# Patient Record
Sex: Female | Born: 1975 | Race: White | Hispanic: No | Marital: Married | Smoking: Never smoker
Health system: Southern US, Community
[De-identification: ages and names within clinical notes are randomized; demographics above are authoritative.]

## PROBLEM LIST (undated history)

## (undated) DIAGNOSIS — F419 Anxiety disorder, unspecified: Secondary | ICD-10-CM

## (undated) DIAGNOSIS — E559 Vitamin D deficiency, unspecified: Secondary | ICD-10-CM

## (undated) HISTORY — PX: KNEE SURGERY: SHX244

## (undated) HISTORY — DX: Vitamin D deficiency, unspecified: E55.9

## (undated) HISTORY — DX: Anxiety disorder, unspecified: F41.9

---

## 1994-09-13 HISTORY — PX: KIDNEY STONE SURGERY: SHX686

## 2004-12-15 ENCOUNTER — Ambulatory Visit: Payer: Self-pay | Admitting: Family Medicine

## 2005-03-11 ENCOUNTER — Ambulatory Visit: Payer: Self-pay | Admitting: Family Medicine

## 2005-12-09 ENCOUNTER — Ambulatory Visit: Payer: Self-pay | Admitting: Family Medicine

## 2005-12-15 ENCOUNTER — Ambulatory Visit: Payer: Self-pay | Admitting: Family Medicine

## 2006-10-28 ENCOUNTER — Ambulatory Visit: Payer: Self-pay | Admitting: Family Medicine

## 2008-10-22 ENCOUNTER — Ambulatory Visit: Payer: Self-pay | Admitting: Family Medicine

## 2008-10-23 ENCOUNTER — Encounter: Payer: Self-pay | Admitting: Family Medicine

## 2008-10-23 DIAGNOSIS — Z87898 Personal history of other specified conditions: Secondary | ICD-10-CM | POA: Insufficient documentation

## 2008-12-03 ENCOUNTER — Encounter: Payer: Self-pay | Admitting: Obstetrics & Gynecology

## 2008-12-03 ENCOUNTER — Ambulatory Visit: Payer: Self-pay | Admitting: Obstetrics & Gynecology

## 2008-12-03 LAB — CONVERTED CEMR LAB
HCV Ab: NEGATIVE
Hepatitis B Surface Ag: NEGATIVE

## 2008-12-18 ENCOUNTER — Ambulatory Visit (HOSPITAL_COMMUNITY): Admission: RE | Admit: 2008-12-18 | Discharge: 2008-12-18 | Payer: Self-pay | Admitting: Family Medicine

## 2009-09-13 HISTORY — PX: CHOLECYSTECTOMY: SHX55

## 2010-04-21 ENCOUNTER — Encounter (INDEPENDENT_AMBULATORY_CARE_PROVIDER_SITE_OTHER): Payer: Self-pay | Admitting: *Deleted

## 2010-05-02 ENCOUNTER — Inpatient Hospital Stay: Payer: Self-pay | Admitting: Surgery

## 2010-05-05 LAB — PATHOLOGY REPORT

## 2010-05-14 ENCOUNTER — Ambulatory Visit: Payer: Self-pay | Admitting: Surgery

## 2010-05-15 ENCOUNTER — Ambulatory Visit: Payer: Self-pay | Admitting: Surgery

## 2010-05-27 ENCOUNTER — Inpatient Hospital Stay: Payer: Self-pay | Admitting: Surgery

## 2010-06-02 LAB — PATHOLOGY REPORT

## 2010-06-18 ENCOUNTER — Ambulatory Visit: Payer: Self-pay | Admitting: Gastroenterology

## 2010-08-20 ENCOUNTER — Ambulatory Visit: Payer: Self-pay | Admitting: Anesthesiology

## 2010-08-20 ENCOUNTER — Other Ambulatory Visit: Payer: Self-pay | Admitting: Anesthesiology

## 2010-09-02 ENCOUNTER — Ambulatory Visit: Payer: Self-pay | Admitting: Anesthesiology

## 2010-09-13 LAB — HM PAP SMEAR: HM PAP: NORMAL

## 2010-09-22 ENCOUNTER — Ambulatory Visit
Admission: RE | Admit: 2010-09-22 | Discharge: 2010-09-22 | Payer: Self-pay | Source: Home / Self Care | Attending: Family Medicine | Admitting: Family Medicine

## 2010-09-30 ENCOUNTER — Ambulatory Visit: Payer: Self-pay | Admitting: Anesthesiology

## 2010-10-13 NOTE — Letter (Signed)
Summary: Nadara Eaton letter  [place] [facility]  [address] [place], [place] [address]   Phone: [phone]  Fax: [phone]       04/21/2010 MRN: [id number]  [name] [address] [place], [place]  [address]  Dear Ms. [name] [place] and [place] announce the retirement of Arta Silence, M.D., from full-time practice at the [facility] office effective March 12, 2010 and his plans of returning part-time.  It is important to Dr. Hetty Ely and to our practice that you understand that [facility] has seven physicians in our office for your health care needs.  We will continue to offer the same exceptional care that you have today.    Dr. Hetty Ely has spoken to many of you about his plans for retirement and returning part-time in the fall.   We will continue to work with you through the transition to schedule appointments for you in the office and meet the high standards that [facility] is committed to.   Again, it is with great pleasure that we share the news that Dr. Hetty Ely will return to [facility] at [facility] in October of 2011 with a reduced schedule.    If you have any questions, or would like to request an appointment with one of our physicians, please call us at [phone] and press the option for Scheduling an appointment.  We take pleasure in providing you with excellent patient care and look forward to seeing you at your next office visit.  Our [facility] Physicians are:  Tillman Abide, M.D. Laurita Quint, M.D. Roxy Manns, M.D. Kerby Nora, M.D. Hannah Beat, M.D. Ruthe Mannan, M.D. We proudly welcomed Raechel Ache, M.D. and Eustaquio Boyden, M.D. to the practice in July/August 2011.  Sincerely,  [facility]

## 2010-12-01 ENCOUNTER — Ambulatory Visit: Payer: Self-pay | Admitting: Obstetrics and Gynecology

## 2010-12-03 ENCOUNTER — Ambulatory Visit: Payer: Self-pay | Admitting: Gastroenterology

## 2010-12-10 LAB — PATHOLOGY REPORT

## 2011-01-26 NOTE — Assessment & Plan Note (Signed)
NAME:  Lori Huber, SHIMA NO.:  [id number]   MEDICAL RECORD NO.:  [id number]          PATIENT TYPE:  POB   LOCATION:  [facility] at [facility]         FACILITY:  [facility]   PHYSICIAN:  Tinnie Gens, MD        DATE OF BIRTH:  07-01-1976   DATE OF SERVICE:  09/22/2010                                  CLINIC NOTE   CHIEF COMPLAINT:  IUD removal and reinsertion   HISTORY OF PRESENT ILLNESS:  The patient is a 35 year old gravida 1,  para 0-0-1-0 who has three stepchildren and does not desire fertility.  She has had an IUD in since 2007.  She desires removal of this one  followed by replacement.  She has regular cycles on her IUD and no heavy  vaginal bleeding.  She really has no problem related to her IUD and she  does desire no children at present.   PHYSICAL EXAMINATION:  VITAL SIGNS:  As in the chart.  GENERAL:  She is a well-developed, well-nourished female in no acute  distress.  ABDOMEN:  Soft, nontender, nondistended.  GU:  Normal external female genitalia.  BUS is normal.  Vagina is pink  and rugated.  Cervix is and nulliparous without lesions.   PROCEDURE:  A straight Tresa Endo was placed through the cervix.  On multiple  attempts to remove the IUD, finally strings were grasped and IUD was  removed.  The uterus that was cleaned with Betadine x2, grasped  anteriorly with single-tooth tenaculum, sounded to 7 cm, and an IUD  placed without difficulty.  The patient tolerated the procedure well.   IMPRESSION:  Undesired fertility.   PLAN:  Status post IUD removal and reinsertion.  The patient will return  in March of this year for repeat Pap smear and IUD string check.           ______________________________  Tinnie Gens, MD     TP/MEDQ  D:  09/22/2010  T:  09/23/2010  Job:  [id number]

## 2011-01-26 NOTE — Assessment & Plan Note (Signed)
NAME:  Lori Huber, Lori Huber                 ACCOUNT NO.:  [id number]   MEDICAL RECORD NO.:  [id number]          PATIENT TYPE:  POB   LOCATION:  [facility] at [facility]         FACILITY:  [facility]   PHYSICIAN:  Johnella Moloney, MD        DATE OF BIRTH:  15-Oct-1975   DATE OF SERVICE:  12/03/2008                                  CLINIC NOTE   CHIEF COMPLAINT:  The patient is here for her yearly examination and  also reports breast pain.   HISTORY OF PRESENT ILLNESS:  The patient is a 35 year old gravida 1,  para 0-0-1-0, last menstrual period of November 18, 2008 who is here for her  annual examination and to initiate gynecologic care.  The patient also  reports that she has had breast pain since December of 2009.  This pain  is concentrated on the outer area lateral border of both breasts but  worse on her left side.  She initially felt that the pain was cyclical  but in the last few months it has been present without any association  to her menstrual periods.  She also feels that her breasts are very  lumpy and wants to make sure that this is normal.  The patient does  not have a family history of breast cancer or any other cancers.  She is  concerned about this breast pain.  Otherwise she has no other  gynecologic concerns.  She reports no intermenstrual bleeding, abnormal  vaginal discharge, problems with intercourse or any other concerns.   PAST OB/GYN HISTORY:  The patient had menarche at age 45.  She has  regular menstrual cycles with 28 days in between cycles.  Her periods  last with 3-5 days and are accompanied by medium flow and mild pain.  No  intermenstrual bleeding.  The patient had a Mirena IUD placed for  contraception in December of 2006.  She has had no problems with this.  She denies checking her IUD or feeling the strings periodically.   OBSTETRICAL HISTORY:  The patient has had 1 termination.  Her last Pap  smear was in December of 2006, which was normal.  She denies any history  of  abnormal Pap smears.  She has never had a mammogram.   PAST MEDICAL HISTORY:  Kidney stones, chicken pox as a child.   PAST SURGICAL HISTORY:  Lithotripsy in 1992 at [facility].   MEDICATIONS:  None.   ALLERGIES:  NO KNOWN DRUG ALLERGIES.  THE PATIENT IS NOT ALLERGIC TO  LATEX.   FAMILY HISTORY:  Remarkable for diabetes, heart disease, and high blood  pressure.  The patient denies a family history of any gynecologic,  breast or colon cancer or any cancer history.   SOCIAL HISTORY:  The patient lives with her husband.  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  The patient does not  smoke.  She drinks 1-2 alcoholic drinks per week and denies any use of  IV or addicting drugs.  She also denies any history of sexual or  physical abuse.  The patient exercises 3-4 times a week.  She usually  walks  about for about 20-40 minutes and in the last few months has been  playing [employer] game.   REVIEW OF SYSTEMS:  Remarkable for frequent headaches.  She denies any  other symptoms.  Her headaches are alleviated by ibuprofen.   On physical examination her blood pressure is 113/76, pulse 82, weight  is 138 pounds, height 5 feet 6-1/2 inches.  In general, no apparent distress.  HEENT:  Normocephalic, atraumatic.  Neck is supple.  Normal thyroid.  No masses.  LUNGS:  Clear to auscultation bilaterally.  HEART:  Regular rate and rhythm.  BREASTS:  Symmetric in size.  No abnormal masses, skin changes, nipple  drainage or lymphadenopathy noted.  The patient just has glandular  tissue that is palpated bilaterally and on the lateral borders of both  breasts the patient was noted to be tender on palpation, especially on  her left side, but no masses were felt in these areas.  ABDOMEN:  Soft, nontender, nondistended.  No abnormal masses.  EXTREMITIES:  No cyanosis, clubbing, or edema.  Nontender.  PELVIC EXAMINATION:  Normal external female  genitalia.  Pink well-  rugated vagina.  Normal discharge.  Normal cervical contour.  Pap smear  was obtained.  On bimanual exam, normal size uterus, mobile and  anteverted.  Normal adnexa bilaterally and no tenderness on adnexa  palpation.   ASSESSMENT AND PLAN:  The patient is a 35 year old gravida 1, para 0-0-1-  0 who is here to initiate gynecologic care and also for a complaint of  breast pain.  As for her preventative health maintenance, she underwent  a Pap smear today and the patient is also interested in sexually-  transmitted disease screening.  A reflex gonorrhea and chlamydia will be  checked from her Pap smear sample and she will have serum tested for  human immunodeficiency virus, hepatitis B, hepatitis C and syphilis.  The patient will be called with the results of this evaluation.  As for  her breast pain, the patient might have a fibrocystic breast, but given  the degree of her symptoms, she was told that she will be booked for a  diagnostic mammogram to evaluate for any other etiologies of her pain.  Of note, during the pelvic examination, the patient's intrauterine  device strings were not visualized.  The patient does say that she does  not remember the last time she felt her intrauterine device strings.  Because of this, a pelvic ultrasound will be ordered to evaluate the  intrauterine device position and will follow up on these results.  The  patient was told to follow up in the clinic for any further gynecologic  concerns.           ______________________________  Johnella Moloney, MD     UD/MEDQ  D:  12/03/2008  T:  12/03/2008  Job:  [id number]

## 2011-01-29 NOTE — Letter (Signed)
October 28, 2006    Rene Kocher, M.D.  [address]  [place], [place] [address]   RE:  Lori, Huber  MRN:  [id number]  /  DOB:  [birth year]   Dear Woody Seller:   This is to introduce Lori Huber, a 35 year old white female who comes  to me today for headaches.  She has had them off and on for some time in  her life but in the last year has developed them almost daily, gets at  least four per week which are painful and excruciating, all throughout  the month, different times of the day, different times of the week,  different stress levels and at different times with respect to her  periods.  She even now is waking up with headaches at times.  She has  gone without caffeine for as long as a year and it has not seemed to  have made any difference.  She is on no medications, has no allergies,  and is basically healthy.  She has had kidney stones with lithotripsy in  1996.   I appreciate your seeing her and look forward to your evaluation.    Sincerely,      Arta Silence, MD  Electronically Signed    RNS/MedQ  DD: 10/28/2006  DT: 10/28/2006  Job #: [id number]

## 2011-10-27 ENCOUNTER — Ambulatory Visit: Payer: Self-pay | Admitting: Physician Assistant

## 2011-11-25 ENCOUNTER — Ambulatory Visit: Payer: Self-pay | Admitting: Orthopedic Surgery

## 2013-11-14 ENCOUNTER — Ambulatory Visit (INDEPENDENT_AMBULATORY_CARE_PROVIDER_SITE_OTHER): Payer: BC Managed Care – PPO | Admitting: Internal Medicine

## 2013-11-14 ENCOUNTER — Telehealth: Payer: Self-pay

## 2013-11-14 ENCOUNTER — Encounter: Payer: Self-pay | Admitting: Internal Medicine

## 2013-11-14 VITALS — BP 102/64 | HR 72 | Temp 97.9°F | Ht 65.0 in | Wt 232.0 lb

## 2013-11-14 DIAGNOSIS — R0602 Shortness of breath: Secondary | ICD-10-CM

## 2013-11-14 DIAGNOSIS — R110 Nausea: Secondary | ICD-10-CM

## 2013-11-14 DIAGNOSIS — R42 Dizziness and giddiness: Secondary | ICD-10-CM

## 2013-11-14 MED ORDER — MECLIZINE HCL 50 MG PO TABS: 25.0000 mg | ORAL_TABLET | Freq: Three times a day (TID) | ORAL | Status: DC | PRN

## 2013-11-14 NOTE — Progress Notes (Signed)
Pre visit review using our clinic review tool, if applicable. No additional management support is needed unless otherwise documented below in the visit note. 

## 2013-11-14 NOTE — Progress Notes (Signed)
Subjective:    Patient ID: Lori Huber, female    DOB: 1976-04-14, 38 y.o.   MRN: [id number]  HPI  Pt presents to the clinic today with c/o dizziness and nausea. She reorts this started 1 days ago. The dizziness last about 2-3 hours and then resolved. She reports the room was spinning. She denies diarrhea and vomiting but does reports that she has had loose stool today x 2. She reports the stool is normal in color and denies blood in her stool. Additionally, she c/o shortness of breath. This started about 2 weeks ago. She reports it seems worse when she is laying down with her arms crossed. She does feel some heaviness in her chest. She denies chest pain. She has no history of allergies, asthma or breathing problems.  Review of Systems      No past medical history on file.  No current outpatient prescriptions on file.   No current facility-administered medications for this visit.    No Known Allergies  No family history on file.  History   Social History  . Marital Status: Married    Spouse Name: N/A    Number of Children: N/A  . Years of Education: N/A   Occupational History  . Not on file.   Social History Main Topics  . Smoking status: Not on file  . Smokeless tobacco: Not on file  . Alcohol Use: Not on file  . Drug Use: Not on file  . Sexual Activity: Not on file   Other Topics Concern  . Not on file   Social History Narrative  . No narrative on file     Constitutional: Pt reports headache. Denies fever, malaise, fatigue, or abrupt weight changes.  HEENT: Pt reports runny nose. Denies eye pain, eye redness, ear pain, ringing in the ears, wax buildup, nasal congestion, bloody nose, or sore throat. Respiratory: Pt reports shortness of breath. Denies difficulty breathing, cough or sputum production.   Cardiovascular: Denies chest pain, chest tightness, palpitations or swelling in the hands or feet.  Gastrointestinal: Pt reports loose stools. Denies abdominal  pain, bloating, constipation, diarrhea or blood in the stool.  Neurological: Pt reports dizziness. Denies  difficulty with memory, difficulty with speech or problems with balance and coordination.   No other specific complaints in a complete review of systems (except as listed in HPI above).  Objective:   Physical Exam  BP 102/64  Pulse 72  Temp(Src) 97.9 F (36.6 C) (Oral)  Ht 5\' 5"  (1.651 m)  Wt 232 lb (105.235 kg)  BMI 38.61 kg/m2  SpO2 98%  LMP 11/06/2013 Wt Readings from Last 3 Encounters:  11/14/13 232 lb (105.235 kg)  10/07/03 199 lb (90.266 kg)  10/22/08 240 lb (108.863 kg)    General: Appears her stated age, obese but well developed, well nourished in NAD. HEENT: Head: normal shape and size; Eyes: sclera white, no icterus, conjunctiva pink, PERRLA and EOMs intact; Ears: bilateral cerumen impaction; Nose: mucosa pink and moist, septum midline; Throat/Mouth: Teeth present, mucosa pink and moist, no exudate, lesions or ulcerations noted.  Cardiovascular: Normal rate and rhythm. S1,S2 noted.  No murmur, rubs or gallops noted. No JVD or BLE edema. No carotid bruits noted. Pulmonary/Chest: Normal effort and positive vesicular breath sounds. No respiratory distress. No wheezes, rales or ronchi noted.  Abdomen: Soft and nontender. Normal bowel sounds, no bruits noted. No distention or masses noted. Liver, spleen and kidneys non palpable. Neurological: Alert and oriented. Cranial nerves II-XII intact.  Coordination normal. +DTRs bilaterally.        Assessment & Plan:   Dizziness and nausea secondary to vertigo:  Drink plenty of fluids to avoid dehydration eRx for Meclizine 25 mg PO tid prn Make poistion changes slowly She declines cerumen removal as she has this done every 6 months by ENT  Shortness of breath:  ? Anxiety related No acute findings Will perform spirometry at new pt visit in 2 weeks  RTC as needed or if symptoms persist or worsen

## 2013-11-14 NOTE — Telephone Encounter (Signed)
Pt called stating that when she went to pharmacy to pick up Rx for Meclizine 50 mg tab--they did not have that dose available only the 25 mg--I called Rene KocherRegina to confirm that it was okay to call pharmacy to change to 25 mg and she stated that was okay--I called pt and she is aware

## 2013-11-14 NOTE — Patient Instructions (Addendum)
Vertigo Vertigo means you feel like you or your surroundings are moving when they are not. Vertigo can be dangerous if it occurs when you are at work, driving, or performing difficult activities.  CAUSES  Vertigo occurs when there is a conflict of signals sent to your brain from the visual and sensory systems in your body. There are many different causes of vertigo, including:  Infections, especially in the inner ear.  A bad reaction to a drug or misuse of alcohol and medicines.  Withdrawal from drugs or alcohol.  Rapidly changing positions, such as lying down or rolling over in bed.  A migraine headache.  Decreased blood flow to the brain.  Increased pressure in the brain from a head injury, infection, tumor, or bleeding. SYMPTOMS  You may feel as though the world is spinning around or you are falling to the ground. Because your balance is upset, vertigo can cause nausea and vomiting. You may have involuntary eye movements (nystagmus). DIAGNOSIS  Vertigo is usually diagnosed by physical exam. If the cause of your vertigo is unknown, your caregiver may perform imaging tests, such as an MRI scan (magnetic resonance imaging). TREATMENT  Most cases of vertigo resolve on their own, without treatment. Depending on the cause, your caregiver may prescribe certain medicines. If your vertigo is related to body position issues, your caregiver may recommend movements or procedures to correct the problem. In rare cases, if your vertigo is caused by certain inner ear problems, you may need surgery. HOME CARE INSTRUCTIONS   Follow your caregiver's instructions.  Avoid driving.  Avoid operating heavy machinery.  Avoid performing any tasks that would be dangerous to you or others during a vertigo episode.  Tell your caregiver if you notice that certain medicines seem to be causing your vertigo. Some of the medicines used to treat vertigo episodes can actually make them worse in some people. SEEK  IMMEDIATE MEDICAL CARE IF:   Your medicines do not relieve your vertigo or are making it worse.  You develop problems with talking, walking, weakness, or using your arms, hands, or legs.  You develop severe headaches.  Your nausea or vomiting continues or gets worse.  You develop visual changes.  A family member notices behavioral changes.  Your condition gets worse. MAKE SURE YOU:  Understand these instructions.  Will watch your condition.  Will get help right away if you are not doing well or get worse. Document Released: 06/09/2005 Document Revised: 11/22/2011 Document Reviewed: 03/18/2011 ExitCare Patient Information 2014 [employer].  

## 2013-11-26 ENCOUNTER — Ambulatory Visit (INDEPENDENT_AMBULATORY_CARE_PROVIDER_SITE_OTHER): Payer: BC Managed Care – PPO | Admitting: Internal Medicine

## 2013-11-26 ENCOUNTER — Encounter: Payer: Self-pay | Admitting: Internal Medicine

## 2013-11-26 VITALS — BP 112/78 | HR 77 | Temp 97.9°F | Ht 65.25 in | Wt 234.0 lb

## 2013-11-26 DIAGNOSIS — E669 Obesity, unspecified: Secondary | ICD-10-CM

## 2013-11-26 DIAGNOSIS — R5381 Other malaise: Secondary | ICD-10-CM

## 2013-11-26 DIAGNOSIS — R0602 Shortness of breath: Secondary | ICD-10-CM

## 2013-11-26 DIAGNOSIS — R5383 Other fatigue: Secondary | ICD-10-CM

## 2013-11-26 LAB — CBC
HCT: 40.6 % (ref 36.0–46.0)
Hemoglobin: 13.4 g/dL (ref 12.0–15.0)
MCHC: 33.1 g/dL (ref 30.0–36.0)
MCV: 95 fl (ref 78.0–100.0)
PLATELETS: 282 10*3/uL (ref 150.0–400.0)
RBC: 4.27 Mil/uL (ref 3.87–5.11)
RDW: 13.1 % (ref 11.5–14.6)
WBC: 9.7 10*3/uL (ref 4.5–10.5)

## 2013-11-26 LAB — COMPREHENSIVE METABOLIC PANEL
ALBUMIN: 4 g/dL (ref 3.5–5.2)
ALK PHOS: 66 U/L (ref 39–117)
ALT: 10 U/L (ref 0–35)
AST: 12 U/L (ref 0–37)
BUN: 14 mg/dL (ref 6–23)
CALCIUM: 9.5 mg/dL (ref 8.4–10.5)
CHLORIDE: 103 meq/L (ref 96–112)
CO2: 30 mEq/L (ref 19–32)
Creatinine, Ser: 0.9 mg/dL (ref 0.4–1.2)
GFR: 74.78 mL/min (ref >60.00)
GLUCOSE: 95 mg/dL (ref 70–99)
POTASSIUM: 4.2 meq/L (ref 3.5–5.1)
SODIUM: 139 meq/L (ref 135–145)
TOTAL PROTEIN: 7.5 g/dL (ref 6.0–8.3)
Total Bilirubin: 0.9 mg/dL (ref 0.3–1.2)

## 2013-11-26 LAB — LIPID PANEL
CHOLESTEROL: 172 mg/dL (ref 0–200)
HDL: 39.8 mg/dL (ref >39.00)
LDL CALC: 109 mg/dL — AB (ref 0–99)
Total CHOL/HDL Ratio: 4
Triglycerides: 118 mg/dL (ref 0.0–149.0)
VLDL: 23.6 mg/dL (ref 0.0–40.0)

## 2013-11-26 LAB — TSH: TSH: 2.66 u[IU]/mL (ref 0.35–5.50)

## 2013-11-26 LAB — HEMOGLOBIN A1C: HEMOGLOBIN A1C: 5.5 % (ref 4.6–6.5)

## 2013-11-26 LAB — VITAMIN B12: VITAMIN B 12: 276 pg/mL (ref 211–911)

## 2013-11-26 NOTE — Progress Notes (Signed)
HPI  Pt presents to the clinic today to establish care. She has not had a PCP in many years. She does have multiple concerns today.  Fatigue: She reports this started in the last year. She relates it to her recent 90 lb weight gain. She has been dieting and exercising  Shortness of breath: Has not gotten any worse.  Headaches: She c/o frequent headaches. They start in her temples and across her forehead. They do occur every day. This started about 1 month ago. She does c/o occasional blurred vision and sensitivity to light and sound. She also c/o tension in the shoulder and upper tech. She does take Ibuprofen or Tylenol with good relief.  Flu: never Tetanus: 1996 LMP: 11/07/13 Pap Smear: 2012 Dentist: as needed   History reviewed. No pertinent past medical history.  No current outpatient prescriptions on file.   No current facility-administered medications for this visit.    No Known Allergies  Family History  Problem Relation Age of Onset  . Hypertension Father   . Hypertension Brother   . Hypertension Paternal Grandmother   . Stroke Paternal Grandmother   . Heart disease Paternal Grandmother   . Diabetes Paternal Grandmother     History   Social History  . Marital Status: Married    Spouse Name: N/A    Number of Children: N/A  . Years of Education: N/A   Occupational History  . Not on file.   Social History Main Topics  . Smoking status: Never Smoker   . Smokeless tobacco: Never Used  . Alcohol Use: Yes     Comment: occasional  . Drug Use: No  . Sexual Activity: Not on file   Other Topics Concern  . Not on file   Social History Narrative  . No narrative on file    ROS:  Constitutional: Denies fever, malaise, fatigue, headache or abrupt weight changes.  HEENT: Denies eye pain, eye redness, ear pain, ringing in the ears, wax buildup, runny nose, nasal congestion, bloody nose, or sore throat. Respiratory: Pt reports shortness of breath. Denies  difficulty breathing, cough or sputum production.   Cardiovascular: Denies chest pain, chest tightness, palpitations or swelling in the hands or feet.  Gastrointestinal: Denies abdominal pain, bloating, constipation, diarrhea or blood in the stool.  GU: Denies frequency, urgency, pain with urination, blood in urine, odor or discharge. Musculoskeletal: Denies decrease in range of motion, difficulty with gait, muscle pain or joint pain and swelling.  Skin: Denies redness, rashes, lesions or ulcercations.  Neurological: Denies dizziness, difficulty with memory, difficulty with speech or problems with balance and coordination.   No other specific complaints in a complete review of systems (except as listed in HPI above).  PE:  BP 112/78  Pulse 77  Temp(Src) 97.9 F (36.6 C) (Oral)  Ht 5' 5.25" (1.657 m)  Wt 234 lb (106.142 kg)  BMI 38.66 kg/m2  SpO2 99%  LMP 11/06/2013 Wt Readings from Last 3 Encounters:  11/26/13 234 lb (106.142 kg)  11/14/13 232 lb (105.235 kg)  10/07/03 199 lb (90.266 kg)    General: Appears her stated age, obese but well developed, well nourished in NAD. HEENT: Head: normal shape and size; Eyes: sclera white, no icterus, conjunctiva pink, PERRLA and EOMs intact; Ears: Tm's gray and intact, normal light reflex; Nose: mucosa pink and moist, septum midline; Throat/Mouth: Teeth present, mucosa pink and moist, no lesions or ulcerations noted.  Neck: Normal range of motion. Neck supple, trachea midline. No massses, lumps or  thyromegaly present.  Cardiovascular: Normal rate and rhythm. S1,S2 noted.  No murmur, rubs or gallops noted. No JVD or BLE edema. No carotid bruits noted. Pulmonary/Chest: Normal effort and positive vesicular breath sounds. No respiratory distress. No wheezes, rales or ronchi noted.  Abdomen: Soft and nontender. Normal bowel sounds, no bruits noted. No distention or masses noted. Liver, spleen and kidneys non palpable. Musculoskeletal: Normal range of  motion. No signs of joint swelling. No difficulty with gait.  Neurological: Alert and oriented. Cranial nerves II-XII intact. Coordination normal. +DTRs bilaterally. Psychiatric: Mood and affect normal. Behavior is normal. Judgment and thought content normal.     Assessment and Plan:  Shortness of breath:  ? Weight related Spirometry normal  Prevent Health Maintenance:  Encouraged her to work on diet and exercise Encouraged her to visit a dentist at least yearly Will check basic screening labs today  Fatigue:  Will check TSH and B12 today  RTC in 1 year or sooner if needed

## 2013-11-26 NOTE — Patient Instructions (Addendum)

## 2013-11-26 NOTE — Progress Notes (Signed)
Pre visit review using our clinic review tool, if applicable. No additional management support is needed unless otherwise documented below in the visit note. 

## 2013-11-27 ENCOUNTER — Encounter: Payer: Self-pay | Admitting: Family Medicine

## 2014-01-07 ENCOUNTER — Encounter: Payer: Self-pay | Admitting: Internal Medicine

## 2014-01-07 ENCOUNTER — Ambulatory Visit (INDEPENDENT_AMBULATORY_CARE_PROVIDER_SITE_OTHER): Payer: BC Managed Care – PPO | Admitting: Internal Medicine

## 2014-01-07 VITALS — BP 118/82 | HR 77 | Temp 98.6°F | Wt 230.3 lb

## 2014-01-07 DIAGNOSIS — J029 Acute pharyngitis, unspecified: Secondary | ICD-10-CM

## 2014-01-07 LAB — POCT RAPID STREP A (OFFICE): RAPID STREP A SCREEN: NEGATIVE

## 2014-01-07 NOTE — Progress Notes (Signed)
HPI  Pt presents to the clinic today with c/o cough ,sore throat and ear fullness. She reports this started yesterday. She is having pain with swallowing. Her cough is productive of mucous. She denies fever, chills or body aches. She has tried Mudloggeralka celtzer with minimal relief. She has no history of allergies or breathing problems. She has not had sick contacts that she is aware of.  Review of Systems     History reviewed. No pertinent past medical history.  Family History  Problem Relation Age of Onset  . Hypertension Father   . Hypertension Brother   . Hypertension Paternal Grandmother   . Stroke Paternal Grandmother   . Heart disease Paternal Grandmother   . Diabetes Paternal Grandmother   . Cancer Neg Hx     History   Social History  . Marital Status: Married    Spouse Name: N/A    Number of Children: N/A  . Years of Education: N/A   Occupational History  . Not on file.   Social History Main Topics  . Smoking status: Never Smoker   . Smokeless tobacco: Never Used  . Alcohol Use: 4.2 oz/week    7 Glasses of wine per week     Comment: occasional  . Drug Use: No  . Sexual Activity: Not on file   Other Topics Concern  . Not on file   Social History Narrative  . No narrative on file    No Known Allergies   Constitutional: Positive headache, fatigue. Denies fever or abrupt weight changes.  HEENT:  Positive sore throat. Denies eye redness, eye pain, pressure behind the eyes, facial pain, nasal congestion, ear pain, ringing in the ears, wax buildup, runny nose or bloody nose. Respiratory: Positive cough. Denies difficulty breathing or shortness of breath.  Cardiovascular: Denies chest pain, chest tightness, palpitations or swelling in the hands or feet.   No other specific complaints in a complete review of systems (except as listed in HPI above).  Objective:   BP 118/82  Pulse 77  Temp(Src) 98.6 F (37 C) (Oral)  Wt 230 lb 5.6 oz (104.486 kg)  SpO2 98% Wt  Readings from Last 3 Encounters:  01/07/14 230 lb 5.6 oz (104.486 kg)  11/26/13 234 lb (106.142 kg)  11/14/13 232 lb (105.235 kg)     General: Appears her stated age, well developed, well nourished in NAD. HEENT: Head: normal shape and size; Eyes: sclera white, no icterus, conjunctiva pink, PERRLA and EOMs intact; Ears: Tm's gray and intact, normal light reflex; Nose: mucosa pink and moist, septum midline; Throat/Mouth: + PND. Teeth present, mucosa erythematous and moist, exudate noted on left tonsillar pillar, no lesions or ulcerations noted.  Neck:  Neck supple, trachea midline. No massses, lumps or thyromegaly present.  Cardiovascular: Normal rate and rhythm. S1,S2 noted.  No murmur, rubs or gallops noted. No JVD or BLE edema. No carotid bruits noted. Pulmonary/Chest: Normal effort and positive vesicular breath sounds. No respiratory distress. No wheezes, rales or ronchi noted.      Assessment & Plan:   Viral pharyngitis:  RST: negative Get some rest and drink plenty of water Do salt water gargles for the sore throat Try ibuprofen for pain and inflammation  RTC as needed or if symptoms persist.

## 2014-01-07 NOTE — Addendum Note (Signed)
Addended by: Littie DeedsEVONTENNO, MELANIE Y on: 01/07/2014 05:00 PM   Modules accepted: Orders

## 2014-01-07 NOTE — Patient Instructions (Addendum)
Viral Pharyngitis Viral pharyngitis is a viral infection that produces redness, pain, and swelling (inflammation) of the throat. It can spread from person to person (contagious). CAUSES Viral pharyngitis is caused by inhaling a large amount of certain germs called viruses. Many different viruses cause viral pharyngitis. SYMPTOMS Symptoms of viral pharyngitis include:  Sore throat.  Tiredness.  Stuffy nose.  Low-grade fever.  Congestion.  Cough. TREATMENT Treatment includes rest, drinking plenty of fluids, and the use of over-the-counter medication (approved by your caregiver). HOME CARE INSTRUCTIONS   Drink enough fluids to keep your urine clear or pale yellow.  Eat soft, cold foods such as ice cream, frozen ice pops, or gelatin dessert.  Gargle with warm salt water (1 tsp salt per 1 qt of water).  If over age 7, throat lozenges may be used safely.  Only take over-the-counter or prescription medicines for pain, discomfort, or fever as directed by your caregiver. Do not take aspirin. To help prevent spreading viral pharyngitis to others, avoid:  Mouth-to-mouth contact with others.  Sharing utensils for eating and drinking.  Coughing around others. SEEK MEDICAL CARE IF:   You are better in a few days, then become worse.  You have a fever or pain not helped by pain medicines.  There are any other changes that concern you. Document Released: 06/09/2005 Document Revised: 11/22/2011 Document Reviewed: 11/05/2010 ExitCare Patient Information 2014 [employer].  

## 2014-01-07 NOTE — Progress Notes (Signed)
Pre visit review using our clinic review tool, if applicable. No additional management support is needed unless otherwise documented below in the visit note. 

## 2014-01-08 ENCOUNTER — Ambulatory Visit: Payer: BC Managed Care – PPO | Admitting: Internal Medicine

## 2014-07-08 ENCOUNTER — Encounter: Payer: Self-pay | Admitting: Internal Medicine

## 2014-07-08 ENCOUNTER — Ambulatory Visit (INDEPENDENT_AMBULATORY_CARE_PROVIDER_SITE_OTHER): Payer: BC Managed Care – PPO | Admitting: Internal Medicine

## 2014-07-08 VITALS — BP 124/82 | HR 77 | Temp 98.2°F | Wt 234.5 lb

## 2014-07-08 DIAGNOSIS — B9789 Other viral agents as the cause of diseases classified elsewhere: Principal | ICD-10-CM

## 2014-07-08 DIAGNOSIS — J069 Acute upper respiratory infection, unspecified: Secondary | ICD-10-CM

## 2014-07-08 MED ORDER — BENZONATATE 200 MG PO CAPS: 200.0000 mg | ORAL_CAPSULE | Freq: Two times a day (BID) | ORAL | Status: DC | PRN

## 2014-07-08 MED ORDER — HYDROCODONE-HOMATROPINE 5-1.5 MG/5ML PO SYRP: 5.0000 mL | ORAL_SOLUTION | Freq: Three times a day (TID) | ORAL | Status: DC | PRN

## 2014-07-08 NOTE — Patient Instructions (Addendum)
Cough, Adult  A cough is a reflex that helps clear your throat and airways. It can help heal the body or may be a reaction to an irritated airway. A cough may only last 2 or 3 weeks (acute) or may last more than 8 weeks (chronic).  CAUSES Acute cough:  Viral or bacterial infections. Chronic cough:  Infections.  Allergies.  Asthma.  Post-nasal drip.  Smoking.  Heartburn or acid reflux.  Some medicines.  Chronic lung problems (COPD).  Cancer. SYMPTOMS   Cough.  Fever.  Chest pain.  Increased breathing rate.  High-pitched whistling sound when breathing (wheezing).  Colored mucus that you cough up (sputum). TREATMENT   A bacterial cough may be treated with antibiotic medicine.  A viral cough must run its course and will not respond to antibiotics.  Your caregiver may recommend other treatments if you have a chronic cough. HOME CARE INSTRUCTIONS   Only take over-the-counter or prescription medicines for pain, discomfort, or fever as directed by your caregiver. Use cough suppressants only as directed by your caregiver.  Use a cold steam vaporizer or humidifier in your bedroom or home to help loosen secretions.  Sleep in a semi-upright position if your cough is worse at night.  Rest as needed.  Stop smoking if you smoke. SEEK IMMEDIATE MEDICAL CARE IF:   You have pus in your sputum.  Your cough starts to worsen.  You cannot control your cough with suppressants and are losing sleep.  You begin coughing up blood.  You have difficulty breathing.  You develop pain which is getting worse or is uncontrolled with medicine.  You have a fever. MAKE SURE YOU:   Understand these instructions.  Will watch your condition.  Will get help right away if you are not doing well or get worse. Document Released: 02/26/2011 Document Revised: 11/22/2011 Document Reviewed: 02/26/2011 ExitCare Patient Information 2015 [employer]. This information is not intended  to replace advice given to you by your health care provider. Make sure you discuss any questions you have with your health care provider.  

## 2014-07-08 NOTE — Progress Notes (Signed)
HPI  Pt presents to the clinic today with c/o cough, chest congestion, wheezing, shortness of breath, headache, chills, nausea and ear pressure. She reports these symptoms started 4 days ago. It started out as sinus symptoms- headache, congestion, nausea and ear pressure. Those symptoms have improved but she feels like it has drained down into her chest. The cough is unproductive. She is unable to sleep at night due to the cough. She denies fever or chills. She has taken Mucinex, Alka Seltzer and Dayquil without much relief. She has no history of allergies or breathing problems. 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  Review of Systems     History reviewed. No pertinent past medical history.  Family History  Problem Relation Age of Onset  . Hypertension Father   . Hypertension Brother   . Hypertension Paternal Grandmother   . Stroke Paternal Grandmother   . Heart disease Paternal Grandmother   . Diabetes Paternal Grandmother   . Cancer Neg Hx     History   Social History  . Marital Status: Married    Spouse Name: N/A    Number of Children: N/A  . Years of Education: N/A   Occupational History  . Not on file.   Social History Main Topics  . Smoking status: Never Smoker   . Smokeless tobacco: Never Used  . Alcohol Use: 4.2 oz/week    7 Glasses of wine per week     Comment: occasional  . Drug Use: No  . Sexual Activity: Not on file   Other Topics Concern  . Not on file   Social History Narrative  . No narrative on file    No Known Allergies   Constitutional: Positive headache, fatigue. Denies fever or abrupt weight changes.  HEENT:  Positive nasal congestion, ear fullness, sore throat. Denies eye redness, eye pain, pressure behind the eyes, facial pain, ringing in the ears, wax buildup, runny nose or bloody nose. Respiratory: Positive cough, wheezing and shortness of breath. Denies difficulty breathing.  Cardiovascular: Denies  chest pain, chest tightness, palpitations or swelling in the hands or feet.   No other specific complaints in a complete review of systems (except as listed in HPI above).  Objective:   BP 124/82  Pulse 77  Temp(Src) 98.2 F (36.8 C) (Oral)  Wt 234 lb 8 oz (106.369 kg)  SpO2 98% Wt Readings from Last 3 Encounters:  07/08/14 234 lb 8 oz (106.369 kg)  01/07/14 230 lb 5.6 oz (104.486 kg)  11/26/13 234 lb (106.142 kg)     General: Appears her stated age, obese but well developed, well nourished in NAD. HEENT: Head: normal shape and size;  Ears: Tm's gray and intact, normal light reflex; Throat/Mouth:  Teeth present, mucosa erythematous and moist, no exudate noted, no lesions or ulcerations noted.  Neck: No cervical lymphadenopathy. Neck supple, trachea midline.   Cardiovascular: Normal rate and rhythm. S1,S2 noted.  No murmur, rubs or gallops noted.  Pulmonary/Chest: Normal effort and positive vesicular breath sounds. No respiratory distress. No wheezes, rales or ronchi noted.      Assessment & Plan:   Viral Upper Respiratory Infection with Cough:  Get some rest and drink plenty of water Do salt water gargles for the sore throat Ibuprofen/tylenol for body aches eRx for Tessalon pearls during the day Rx for Hycodan cough syrup at night  If no improvement by Friday, call me, will call in a zpack  RTC as needed or if symptoms persist.

## 2014-07-08 NOTE — Progress Notes (Signed)
Pre visit review using our clinic review tool, if applicable. No additional management support is needed unless otherwise documented below in the visit note. 

## 2014-07-09 ENCOUNTER — Ambulatory Visit: Payer: BC Managed Care – PPO | Admitting: Internal Medicine

## 2014-07-11 ENCOUNTER — Other Ambulatory Visit: Payer: Self-pay | Admitting: Internal Medicine

## 2014-07-11 ENCOUNTER — Telehealth: Payer: Self-pay

## 2014-07-11 MED ORDER — AZITHROMYCIN 250 MG PO TABS: ORAL_TABLET | ORAL | Status: DC

## 2014-07-11 NOTE — Telephone Encounter (Signed)
Pt is aware as instructed 

## 2014-07-11 NOTE — Telephone Encounter (Signed)
Call in zpack for her

## 2014-07-11 NOTE — Telephone Encounter (Signed)
Pt left v/m; pt seen 07/08/14 and pt condition has worsened since Mon; pt has very bad H/A associated with coughing; pt now has productive cough with a lot of phlegm; pt vomited last night after coughing episode. Cough seems deeper than when seen on 07/08/14. Pt request cb.[facility].

## 2014-08-29 ENCOUNTER — Ambulatory Visit (INDEPENDENT_AMBULATORY_CARE_PROVIDER_SITE_OTHER): Payer: BC Managed Care – PPO | Admitting: Internal Medicine

## 2014-08-29 ENCOUNTER — Encounter: Payer: Self-pay | Admitting: Internal Medicine

## 2014-08-29 VITALS — BP 118/76 | HR 75 | Temp 98.1°F | Wt 239.5 lb

## 2014-08-29 DIAGNOSIS — R059 Cough, unspecified: Secondary | ICD-10-CM

## 2014-08-29 DIAGNOSIS — R0602 Shortness of breath: Secondary | ICD-10-CM

## 2014-08-29 DIAGNOSIS — R05 Cough: Secondary | ICD-10-CM

## 2014-08-29 MED ORDER — ALBUTEROL SULFATE HFA 108 (90 BASE) MCG/ACT IN AERS: 2.0000 | INHALATION_SPRAY | Freq: Four times a day (QID) | RESPIRATORY_TRACT | Status: DC | PRN

## 2014-08-29 NOTE — Progress Notes (Signed)
HPI  Pt presents to the clinic today with c/o cough. She was seen for the same 10/25/015. She was originally treated with Hycodan and Tessalon Pearls. She called back 07/11/14 and had not had much relief. She was called in a zpac at that time. She reports improvement in her symptoms. However she noticed at the end of 07/2014, her cough returned. The cough is mostly non productive. She has had some associated shortness of breath, mostly with exertion. She denies fever, chills or body aches. She has continued to take the cough syrup at night. She does feel like her symptoms are worse at night. She has not had sick contacts that she is aware of. She has no history of allergies or breathing problems. She denies history of GERD.  Review of Systems     History reviewed. No pertinent past medical history.  Family History  Problem Relation Age of Onset  . Hypertension Father   . Hypertension Brother   . Hypertension Paternal Grandmother   . Stroke Paternal Grandmother   . Heart disease Paternal Grandmother   . Diabetes Paternal Grandmother   . Cancer Neg Hx     History   Social History  . Marital Status: Married    Spouse Name: N/A    Number of Children: N/A  . Years of Education: N/A   Occupational History  . Not on file.   Social History Main Topics  . Smoking status: Never Smoker   . Smokeless tobacco: Never Used  . Alcohol Use: 4.2 oz/week    7 Glasses of wine per week     Comment: occasional  . Drug Use: No  . Sexual Activity: Not on file   Other Topics Concern  . Not on file   Social History Narrative    No Known Allergies   Constitutional:  Denies headache, fatigue, fever or abrupt weight changes.  HEENT:   Denies eye redness, eye pain, pressure behind the eyes, facial pain, nasal congestion, ear pain, ringing in the ears, wax buildup, runny nose or sore throat. Respiratory: Positive cough and shortness of breath. Denies difficulty breathing or shortness of breath.   Cardiovascular: Denies chest pain, chest tightness, palpitations or swelling in the hands or feet.   No other specific complaints in a complete review of systems (except as listed in HPI above).  Objective:   BP 118/76 mmHg  Pulse 75  Temp(Src) 98.1 F (36.7 C) (Oral)  Wt 239 lb 8 oz (108.636 kg)  SpO2 98% Wt Readings from Last 3 Encounters:  08/29/14 239 lb 8 oz (108.636 kg)  07/08/14 234 lb 8 oz (106.369 kg)  01/07/14 230 lb 5.6 oz (104.486 kg)     General: Appears her stated age,obese but  well developed, well nourished in NAD. HEENT: Head: normal shape and size, no sinus tenderness noted; Eyes: sclera white, no icterus, conjunctiva pinkt; Ears: Tm's gray and intact, normal light reflex; Throat/Mouth: Teeth present, mucosa pink and moist, no exudate noted, no lesions or ulcerations noted.  Neck: No adenopathy noted. Cardiovascular: Normal rate and rhythm. S1,S2 noted.  No murmur, rubs or gallops noted.  Pulmonary/Chest: Normal effort and positive vesicular breath sounds. No respiratory distress. No wheezes, rales or ronchi noted.  Abdomen: Soft and nontender. No distention or masses noted.    Assessment & Plan:   Cough and SOB:  Exam normal today ? Post infective cough vs RAD vs GERD Will give RX for albuterol Try Prilosec OTC daily x 2 weeks, if symptoms  go away, we will know that this is GERD No need for repeat antibiotic  RTC as needed or if symptoms persist.

## 2014-08-29 NOTE — Progress Notes (Signed)
Pre visit review using our clinic review tool, if applicable. No additional management support is needed unless otherwise documented below in the visit note. 

## 2014-08-29 NOTE — Patient Instructions (Signed)
Cough, Adult  A cough is a reflex that helps clear your throat and airways. It can help heal the body or may be a reaction to an irritated airway. A cough may only last 2 or 3 weeks (acute) or may last more than 8 weeks (chronic).  CAUSES Acute cough:  Viral or bacterial infections. Chronic cough:  Infections.  Allergies.  Asthma.  Post-nasal drip.  Smoking.  Heartburn or acid reflux.  Some medicines.  Chronic lung problems (COPD).  Cancer. SYMPTOMS   Cough.  Fever.  Chest pain.  Increased breathing rate.  High-pitched whistling sound when breathing (wheezing).  Colored mucus that you cough up (sputum). TREATMENT   A bacterial cough may be treated with antibiotic medicine.  A viral cough must run its course and will not respond to antibiotics.  Your caregiver may recommend other treatments if you have a chronic cough. HOME CARE INSTRUCTIONS   Only take over-the-counter or prescription medicines for pain, discomfort, or fever as directed by your caregiver. Use cough suppressants only as directed by your caregiver.  Use a cold steam vaporizer or humidifier in your bedroom or home to help loosen secretions.  Sleep in a semi-upright position if your cough is worse at night.  Rest as needed.  Stop smoking if you smoke. SEEK IMMEDIATE MEDICAL CARE IF:   You have pus in your sputum.  Your cough starts to worsen.  You cannot control your cough with suppressants and are losing sleep.  You begin coughing up blood.  You have difficulty breathing.  You develop pain which is getting worse or is uncontrolled with medicine.  You have a fever. MAKE SURE YOU:   Understand these instructions.  Will watch your condition.  Will get help right away if you are not doing well or get worse. Document Released: 02/26/2011 Document Revised: 11/22/2011 Document Reviewed: 02/26/2011 ExitCare Patient Information 2015 [employer]. This information is not intended  to replace advice given to you by your health care provider. Make sure you discuss any questions you have with your health care provider.  

## 2015-01-05 NOTE — Op Note (Signed)
PATIENT NAME:  Lori Huber, Lori Huber MR#:  [id number] DATE OF BIRTH:  [birth year]  DATE OF PROCEDURE:  11/25/2011  PREOPERATIVE DIAGNOSIS: Left knee anterior lateral meniscus tear.   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   PROCEDURE: Partial synovectomy and lateral release.   SURGEON: Leitha SchullerMichael J. Nakyia Dau, M.D.   ANESTHESIA: General.   DESCRIPTION OF PROCEDURE: The patient was brought to the operating room and after adequate anesthesia was obtained, the left leg was prepped and draped in the usual sterile fashion with a tourniquet applied to the upper thigh along with an arthroscopic legholder. After patient identification and time-out procedures were completed, an inferolateral portal was made and the arthroscope was introduced. Initial inspection revealed significant patellar subluxation with normal articular cartilage. Coming around medially, an inferomedial portal was made. There was also significant medial plica. On probing the medial meniscus was intact as was the medial cartilage with minimal degenerative change. The anterior cruciate ligament was intact in the central notch was some synovitis present. Laterally there was a fat pad and synovitis, which appeared to impinge on the anterior aspect of the tibiofemoral articulation and seemed excessive. At this point an ArthroCare wand was used to ablate the plica first at the medial patellofemoral joint. Next, going into the anterior lateral aspect of the knee, this excessive synovitis and fat pad were resected so they no longer impinged on the joint, and finally, again with the 50-degree ArthroCare wand, lateral release was carried out, releasing a tight lateral patellofemoral ligament. Again, the articular cartilage appeared intact except for one small 3 to 4-mm area on the lateral femoral condyle. Otherwise articular cartilage was normal. The knee was thoroughly irrigated. The gutters were checked. There  were no loose bodies. After thorough irrigation, all instrumentation was withdrawn. The areas of the incisions and superolateral aspect of the area of the lateral release were infiltrated with a total of 30 mL of 0.5% Sensorcaine with epinephrine. Xeroform, 4 x 4's, Webril, abdomen, and Ace wrap were applied and the patient was sent to the recovery room in stable condition.   ESTIMATED BLOOD LOSS: Minimal.   COMPLICATIONS: None.   SPECIMENS: None. Pre- and postprocedure pictures obtained.     ____________________________ Leitha SchullerMichael J. Gessica Jawad, MD mjm:bjt D: 11/25/2011 19:27:42 ET T: 11/26/2011 09:27:13 ET JOB#: [id number]  cc: Leitha SchullerMichael J. Alayzha An, MD, <Dictator> Leitha SchullerMICHAEL J Daxon Kyne MD ELECTRONICALLY SIGNED 11/26/2011 10:12

## 2015-06-04 ENCOUNTER — Encounter: Payer: Self-pay | Admitting: Internal Medicine

## 2015-06-04 ENCOUNTER — Ambulatory Visit (INDEPENDENT_AMBULATORY_CARE_PROVIDER_SITE_OTHER): Payer: BLUE CROSS/BLUE SHIELD | Admitting: Internal Medicine

## 2015-06-04 VITALS — BP 120/74 | HR 79 | Temp 98.3°F | Wt 239.5 lb

## 2015-06-04 DIAGNOSIS — R5383 Other fatigue: Secondary | ICD-10-CM

## 2015-06-04 DIAGNOSIS — M2550 Pain in unspecified joint: Secondary | ICD-10-CM

## 2015-06-04 DIAGNOSIS — R238 Other skin changes: Secondary | ICD-10-CM | POA: Diagnosis not present

## 2015-06-04 DIAGNOSIS — G44209 Tension-type headache, unspecified, not intractable: Secondary | ICD-10-CM | POA: Diagnosis not present

## 2015-06-04 DIAGNOSIS — R233 Spontaneous ecchymoses: Secondary | ICD-10-CM

## 2015-06-04 LAB — RHEUMATOID FACTOR

## 2015-06-04 NOTE — Patient Instructions (Signed)

## 2015-06-04 NOTE — Progress Notes (Signed)
Subjective:    Patient ID: Lori Huber, female    DOB: 1975/12/16, 39 y.o.   MRN: [id number]  HPI  Pt presents to the clinic today with multiple complaints:  Fatigue: This started 5-6 weeks ago. She just feels like she has no energy. She has been under a lot of stress lately and not sure if this is contributing. She did recently move and has started another semester at school.  Headache: This started many years ago. She has had 2-3 bad ones over the last month, but often has a daily headache. It is located in the temples. She describes the pain as sharp and stabbing. She does have sensitivity to light and sound. She has nausea but denies vomiting. She takes Ibuprofen and lays in a dark room with some relief. She reports she has had headaches most of her life. They are usually triggered by stress and caffeine.  Multiple joint pain, achiness. Pain in the elbows, knees and hips. She reports it is stiff and sharp, stabbing pains. She has not noticed any swelling in the joints. The pain comes and goes and moves from joint to joint. She has only tried Ibuprofen OTC> She has no family history of autoimmune disorders.  Easy bruising: She feels like she has always been an easy bruiser but it seems more exaggerated lately. She will wake up with bruises and does not remember how she gets them. She has not noticed any s/s of bleeding. She does not take any aspirin.  Review of Systems      No past medical history on file.  Current Outpatient Prescriptions  Medication Sig Dispense Refill  . ibuprofen (ADVIL,MOTRIN) 200 MG tablet Take 200 mg by mouth as needed.    Marland Kitchen albuterol (PROVENTIL HFA;VENTOLIN HFA) 108 (90 BASE) MCG/ACT inhaler Inhale 1-2 puffs into the lungs every 6 (six) hours as needed for wheezing or shortness of breath.     No current facility-administered medications for this visit.    No Known Allergies  Family History  Problem Relation Age of Onset  . Hypertension Father   .  Hypertension Brother   . Hypertension Paternal Grandmother   . Stroke Paternal Grandmother   . Heart disease Paternal Grandmother   . Diabetes Paternal Grandmother   . Cancer Neg Hx     Social History   Social History  . Marital Status: Married    Spouse Name: N/A  . Number of Children: N/A  . Years of Education: N/A   Occupational History  . Not on file.   Social History Main Topics  . Smoking status: Never Smoker   . Smokeless tobacco: Never Used  . Alcohol Use: 4.2 oz/week    7 Glasses of wine per week     Comment: occasional  . Drug Use: No  . Sexual Activity: Not on file   Other Topics Concern  . Not on file   Social History Narrative     Constitutional: Pt reports fatigue and headache. Denies fever, malaise, or abrupt weight changes.  HEENT: Denies eye pain, eye redness, ear pain, ringing in the ears, wax buildup, runny nose, nasal congestion, bloody nose, or sore throat. Respiratory: Denies difficulty breathing, shortness of breath, cough or sputum production.   Cardiovascular: Denies chest pain, chest tightness, palpitations or swelling in the hands or feet.  Musculoskeletal: Pt reports multiple joint pain. Denies decrease in range of motion, difficulty with gait, muscle pain or joint swelling.  Skin: Denies redness, rashes, lesions or  ulcercations.  Neurological: Denies dizziness, difficulty with memory, difficulty with speech or problems with balance and coordination.  Psych: Pt reports stress. Denies anxiety, depression, SI/HI.  No other specific complaints in a complete review of systems (except as listed in HPI above).  Objective:   Physical Exam   BP 120/74 mmHg  Pulse 79  Temp(Src) 98.3 F (36.8 C) (Oral)  Wt 239 lb 8 oz (108.636 kg)  SpO2 99%  LMP 05/16/2015 (Exact Date) Wt Readings from Last 3 Encounters:  06/04/15 239 lb 8 oz (108.636 kg)  08/29/14 239 lb 8 oz (108.636 kg)  07/08/14 234 lb 8 oz (106.369 kg)    General: Appears her  stated age, obese in NAD. Skin: Warm, dry and intact. No rashes, lesions or ulcerations noted. HEENT: Head: normal shape and size; Eyes: sclera white, no icterus, conjunctiva pink, PERRLA and EOMs intact;  Neck:  Neck supple, trachea midline. No masses, lumps or thyromegaly present.  Cardiovascular: Normal rate and rhythm. S1,S2 noted.   Pulmonary/Chest: Normal effort and positive vesicular breath sounds. No respiratory distress. No wheezes, rales or ronchi noted.  Musculoskeletal: Normal range of motion of shoulders, elbows, hips and knees. No signs of joint swelling. No difficulty with gait.  Neurological: Alert and oriented.  Psychiatric: Mood and affect normal. She does seem mildly anxious today.  BMET    Component Value Date/Time   NA 139 11/26/2013 1421   K 4.2 11/26/2013 1421   CL 103 11/26/2013 1421   CO2 30 11/26/2013 1421   GLUCOSE 95 11/26/2013 1421   BUN 14 11/26/2013 1421   CREATININE 0.9 11/26/2013 1421   CALCIUM 9.5 11/26/2013 1421    Lipid Panel     Component Value Date/Time   CHOL 172 11/26/2013 1421   TRIG 118.0 11/26/2013 1421   HDL 39.80 11/26/2013 1421   CHOLHDL 4 11/26/2013 1421   VLDL 23.6 11/26/2013 1421   LDLCALC 109* 11/26/2013 1421    CBC    Component Value Date/Time   WBC 9.7 11/26/2013 1421   RBC 4.27 11/26/2013 1421   HGB 13.4 11/26/2013 1421   HCT 40.6 11/26/2013 1421   PLT 282.0 11/26/2013 1421   MCV 95.0 11/26/2013 1421   MCHC 33.1 11/26/2013 1421   RDW 13.1 11/26/2013 1421    Hgb A1C Lab Results  Component Value Date   HGBA1C 5.5 11/26/2013        Assessment & Plan:   Fatiuge:  Could be a manifestation of stress Will check TSH, B12, Vit D and Folate today  Tension headache:  Advised her to continue Ibuprofen prn Discussed stress relaxing techniques Massage may be helpful  Easy bruising:  Will check CBC and CMET today No evidence of this on exam  Multiple joint pain:  Will check rheum workup RF, ANA, ESR  today  Will follow up after labs. RTC as needed or if symptoms persist or worsen

## 2015-06-04 NOTE — Progress Notes (Signed)
Pre visit review using our clinic review tool, if applicable. No additional management support is needed unless otherwise documented below in the visit note. 

## 2015-06-05 LAB — COMPREHENSIVE METABOLIC PANEL
ALBUMIN: 4.1 g/dL (ref 3.5–5.2)
ALK PHOS: 66 U/L (ref 39–117)
ALT: 15 U/L (ref 0–35)
AST: 17 U/L (ref 0–37)
BUN: 17 mg/dL (ref 6–23)
CO2: 27 mEq/L (ref 19–32)
Calcium: 9.4 mg/dL (ref 8.4–10.5)
Chloride: 102 mEq/L (ref 96–112)
Creatinine, Ser: 0.77 mg/dL (ref 0.40–1.20)
GFR: 87.71 mL/min (ref >60.00)
Glucose, Bld: 80 mg/dL (ref 70–99)
POTASSIUM: 4.2 meq/L (ref 3.5–5.1)
Sodium: 137 mEq/L (ref 135–145)
TOTAL PROTEIN: 7.7 g/dL (ref 6.0–8.3)
Total Bilirubin: 0.5 mg/dL (ref 0.2–1.2)

## 2015-06-05 LAB — ANA: ANA: NEGATIVE

## 2015-06-05 LAB — CBC WITH DIFFERENTIAL/PLATELET
BASOS PCT: 0.3 % (ref 0.0–3.0)
Basophils Absolute: 0 10*3/uL (ref 0.0–0.1)
EOS PCT: 2.3 % (ref 0.0–5.0)
Eosinophils Absolute: 0.2 10*3/uL (ref 0.0–0.7)
HCT: 40.3 % (ref 36.0–46.0)
HEMOGLOBIN: 13.6 g/dL (ref 12.0–15.0)
LYMPHS ABS: 2.3 10*3/uL (ref 0.7–4.0)
Lymphocytes Relative: 26.2 % (ref 12.0–46.0)
MCHC: 33.8 g/dL (ref 30.0–36.0)
MCV: 92.9 fl (ref 78.0–100.0)
MONOS PCT: 9.3 % (ref 3.0–12.0)
Monocytes Absolute: 0.8 10*3/uL (ref 0.1–1.0)
NEUTROS PCT: 61.9 % (ref 43.0–77.0)
Neutro Abs: 5.5 10*3/uL (ref 1.4–7.7)
Platelets: 336 10*3/uL (ref 150.0–400.0)
RBC: 4.34 Mil/uL (ref 3.87–5.11)
RDW: 13.2 % (ref 11.5–15.5)
WBC: 8.9 10*3/uL (ref 4.0–10.5)

## 2015-06-05 LAB — SEDIMENTATION RATE: SED RATE: 41 mm/h — AB (ref 0–22)

## 2015-06-05 LAB — TSH: TSH: 3.25 u[IU]/mL (ref 0.35–4.50)

## 2015-06-05 LAB — FOLATE: Folate: >24.8 ng/mL (ref >5.9)

## 2015-06-05 LAB — VITAMIN B12: VITAMIN B 12: 275 pg/mL (ref 211–911)

## 2015-09-03 ENCOUNTER — Encounter: Payer: Self-pay | Admitting: *Deleted

## 2015-09-03 ENCOUNTER — Encounter: Payer: Self-pay | Admitting: Obstetrics & Gynecology

## 2015-09-03 ENCOUNTER — Ambulatory Visit (INDEPENDENT_AMBULATORY_CARE_PROVIDER_SITE_OTHER): Payer: BLUE CROSS/BLUE SHIELD | Admitting: Obstetrics & Gynecology

## 2015-09-03 VITALS — BP 108/74 | HR 80 | Resp 18 | Ht 66.5 in | Wt 241.0 lb

## 2015-09-03 DIAGNOSIS — Z0000 Encounter for general adult medical examination without abnormal findings: Secondary | ICD-10-CM

## 2015-09-03 DIAGNOSIS — Z30433 Encounter for removal and reinsertion of intrauterine contraceptive device: Secondary | ICD-10-CM | POA: Diagnosis not present

## 2015-09-03 NOTE — Progress Notes (Signed)
   Subjective:    Patient ID: Lori Huber, female    DOB: 11/18/1975, 39 y.o.   MRN: [id number]  HPI  39 yo MW G0 (3 step kids) is here for a removal of her 39 yo Mirena and replacement with a new one. She likes the Mirena, generally amenorrheic.  Review of Systems     Objective:   Physical Exam [id number] UPT negative, consent signed, Time out procedure done. I easily removed her intact Mirena. Cervix prepped with betadine and grasped with a single tooth tenaculum. Mirena was easily placed and the strings were cut to 3-4 cm. Uterus sounded to 9 cm. She tolerated the procedure well.         Assessment & Plan:  Preventative- schedule mammogram and annual exam Contraception- Mirena

## 2015-10-09 ENCOUNTER — Ambulatory Visit
Admission: RE | Admit: 2015-10-09 | Discharge: 2015-10-09 | Disposition: A | Discharge status: Home / Self Care | Payer: BLUE CROSS/BLUE SHIELD | Source: Ambulatory Visit | Attending: Obstetrics & Gynecology | Admitting: Obstetrics & Gynecology

## 2015-10-09 DIAGNOSIS — Z0000 Encounter for general adult medical examination without abnormal findings: Secondary | ICD-10-CM

## 2015-10-09 DIAGNOSIS — Z1231 Encounter for screening mammogram for malignant neoplasm of breast: Secondary | ICD-10-CM | POA: Insufficient documentation

## 2015-10-22 ENCOUNTER — Ambulatory Visit: Payer: BLUE CROSS/BLUE SHIELD | Admitting: Obstetrics and Gynecology

## 2015-10-29 ENCOUNTER — Ambulatory Visit (INDEPENDENT_AMBULATORY_CARE_PROVIDER_SITE_OTHER): Payer: BLUE CROSS/BLUE SHIELD | Admitting: Certified Nurse Midwife

## 2015-10-29 ENCOUNTER — Encounter: Payer: Self-pay | Admitting: Certified Nurse Midwife

## 2015-10-29 VITALS — BP 133/87 | HR 76 | Wt 245.0 lb

## 2015-10-29 DIAGNOSIS — Z124 Encounter for screening for malignant neoplasm of cervix: Secondary | ICD-10-CM | POA: Diagnosis not present

## 2015-10-29 DIAGNOSIS — Z1151 Encounter for screening for human papillomavirus (HPV): Secondary | ICD-10-CM

## 2015-10-29 DIAGNOSIS — Z30431 Encounter for routine checking of intrauterine contraceptive device: Secondary | ICD-10-CM

## 2015-10-29 DIAGNOSIS — Z01419 Encounter for gynecological examination (general) (routine) without abnormal findings: Secondary | ICD-10-CM | POA: Diagnosis not present

## 2015-10-29 NOTE — Patient Instructions (Signed)
Levonorgestrel intrauterine device (IUD) What is this medicine? LEVONORGESTREL IUD (LEE voe nor jes trel) is a contraceptive (birth control) device. The device is placed inside the uterus by a healthcare professional. It is used to prevent pregnancy and can also be used to treat heavy bleeding that occurs during your period. Depending on the device, it can be used for 3 to 5 years. This medicine may be used for other purposes; ask your health care provider or pharmacist if you have questions. What should I tell my health care provider before I take this medicine? They need to know if you have any of these conditions: -abnormal Pap smear -cancer of the breast, uterus, or cervix -diabetes -endometritis -genital or pelvic infection now or in the past -have more than one sexual partner or your partner has more than one partner -heart disease -history of an ectopic or tubal pregnancy -immune system problems -IUD in place -liver disease or tumor -problems with blood clots or take blood-thinners -use intravenous drugs -uterus of unusual shape -vaginal bleeding that has not been explained -an unusual or allergic reaction to levonorgestrel, other hormones, silicone, or polyethylene, medicines, foods, dyes, or preservatives -pregnant or trying to get pregnant -breast-feeding How should I use this medicine? This device is placed inside the uterus by a health care professional. Talk to your pediatrician regarding the use of this medicine in children. Special care may be needed. Overdosage: If you think you have taken too much of this medicine contact a poison control center or emergency room at once. NOTE: This medicine is only for you. Do not share this medicine with others. What if I miss a dose? This does not apply. What may interact with this medicine? Do not take this medicine with any of the following medications: -amprenavir -bosentan -fosamprenavir This medicine may also interact with  the following medications: -aprepitant -barbiturate medicines for inducing sleep or treating seizures -bexarotene -griseofulvin -medicines to treat seizures like carbamazepine, ethotoin, felbamate, oxcarbazepine, phenytoin, topiramate -modafinil -pioglitazone -rifabutin -rifampin -rifapentine -some medicines to treat HIV infection like atazanavir, indinavir, lopinavir, nelfinavir, tipranavir, ritonavir -St. John's wort -warfarin This list may not describe all possible interactions. Give your health care provider a list of all the medicines, herbs, non-prescription drugs, or dietary supplements you use. Also tell them if you smoke, drink alcohol, or use illegal drugs. Some items may interact with your medicine. What should I watch for while using this medicine? Visit your doctor or health care professional for regular check ups. See your doctor if you or your partner has sexual contact with others, becomes HIV positive, or gets a sexual transmitted disease. This product does not protect you against HIV infection (AIDS) or other sexually transmitted diseases. You can check the placement of the IUD yourself by reaching up to the top of your vagina with clean fingers to feel the threads. Do not pull on the threads. It is a good habit to check placement after each menstrual period. Call your doctor right away if you feel more of the IUD than just the threads or if you cannot feel the threads at all. The IUD may come out by itself. You may become pregnant if the device comes out. If you notice that the IUD has come out use a backup birth control method like condoms and call your health care provider. Using tampons will not change the position of the IUD and are okay to use during your period. What side effects may I notice from receiving this medicine?   Side effects that you should report to your doctor or health care professional as soon as possible: -allergic reactions like skin rash, itching or  hives, swelling of the face, lips, or tongue -fever, flu-like symptoms -genital sores -high blood pressure -no menstrual period for 6 weeks during use -pain, swelling, warmth in the leg -pelvic pain or tenderness -severe or sudden headache -signs of pregnancy -stomach cramping -sudden shortness of breath -trouble with balance, talking, or walking -unusual vaginal bleeding, discharge -yellowing of the eyes or skin Side effects that usually do not require medical attention (report to your doctor or health care professional if they continue or are bothersome): -acne -breast pain -change in sex drive or performance -changes in weight -cramping, dizziness, or faintness while the device is being inserted -headache -irregular menstrual bleeding within first 3 to 6 months of use -nausea This list may not describe all possible side effects. Call your doctor for medical advice about side effects. You may report side effects to FDA at 1-800-FDA-1088. Where should I keep my medicine? This does not apply. NOTE: This sheet is a summary. It may not cover all possible information. If you have questions about this medicine, talk to your doctor, pharmacist, or health care provider.    2016, Elsevier/Gold Standard. (2011-09-30 13:54:04)  

## 2015-10-29 NOTE — Progress Notes (Signed)
Patient ID: Lori Huber, female   DOB: 1976-06-02, 40 y.o.   MRN: [id number]    GYNECOLOGY CLINIC ANNUAL PREVENTATIVE CARE ENCOUNTER NOTE  Subjective:   Lori Huber is a 40 y.o. No obstetric history on file. female here for a routine annual gynecologic exam.  Current complaints: none .   Denies abnormal vaginal bleeding, discharge, pelvic pain, problems with intercourse or other gynecologic concerns.      Gynecologic History Patient's last menstrual period was 09/19/2015. Contraception: IUD  Last mammogram: 2017. Results were: normal  Obstetric History OB History  No data available    History reviewed. No pertinent past medical history.  Past Surgical History  Procedure Laterality Date  . Cholecystectomy  2011  . Knee surgery Left   . Kidney stone surgery  1996    Current Outpatient Prescriptions on File Prior to Visit  Medication Sig Dispense Refill  . albuterol (PROVENTIL HFA;VENTOLIN HFA) 108 (90 BASE) MCG/ACT inhaler Inhale 1-2 puffs into the lungs every 6 (six) hours as needed for wheezing or shortness of breath. Reported on 09/03/2015    . ibuprofen (ADVIL,MOTRIN) 200 MG tablet Take 200 mg by mouth as needed.    Marland Kitchen levonorgestrel (MIRENA) 20 MCG/24HR IUD 1 each by Intrauterine route once.    . naproxen sodium (ALEVE) 220 MG tablet Take 220 mg by mouth as needed.     No current facility-administered medications on file prior to visit.    No Known Allergies  Social History   Social History  . Marital Status: Married    Spouse Name: N/A  . Number of Children: N/A  . Years of Education: N/A   Occupational History  . Not on file.   Social History Main Topics  . Smoking status: Never Smoker   . Smokeless tobacco: Never Used  . Alcohol Use: 4.2 oz/week    7 Glasses of wine per week     Comment: occasional  . Drug Use: No  . Sexual Activity: Yes    Birth Control/ Protection: IUD   Other Topics Concern  . Not on file   Social History  Narrative    Family History  Problem Relation Age of Onset  . Hypertension Father   . Hypertension Brother   . Hypertension Paternal Grandmother   . Stroke Paternal Grandmother   . Heart disease Paternal Grandmother   . Diabetes Paternal Grandmother   . Cancer Neg Hx     The following portions of the patient's history were reviewed and updated as appropriate: allergies, current medications, past family history, past medical history, past social history, past surgical history and problem list.  Review of Systems Pertinent items are noted in HPI.   Objective:  BP 133/87 mmHg  Pulse 76  Wt 245 lb (111.131 kg)  LMP 09/19/2015 CONSTITUTIONAL: Well-developed, well-nourished female in no acute distress.  HENT:  Normocephalic, atraumatic, External right and left ear normal. Oropharynx is clear and moist EYES: Conjunctivae and EOM are normal. Pupils are equal, round, and reactive to light. No scleral icterus.  NECK: Normal range of motion, supple, no masses.  Normal thyroid.  SKIN: Skin is warm and dry. No rash noted. Not diaphoretic. No erythema. No pallor. NEUROLOGIC: Alert and oriented to person, place, and time. Normal reflexes, muscle tone coordination. No cranial nerve deficit noted. PSYCHIATRIC: Normal mood and affect. Normal behavior. Normal judgment and thought content. CARDIOVASCULAR: Normal heart rate noted, regular rhythm RESPIRATORY: Clear to auscultation bilaterally. Effort and breath sounds normal, no  problems with respiration noted. BREASTS: Symmetric in size. No masses, skin changes, nipple drainage, or lymphadenopathy. ABDOMEN: Soft, normal bowel sounds, no distention noted.  No tenderness, rebound or guarding.  PELVIC: Normal appearing external genitalia; normal appearing vaginal mucosa and cervix.  No abnormal discharge noted.  Pap smear obtained.  Normal uterine size, no other palpable masses, no uterine or adnexal tenderness. MUSCULOSKELETAL: Normal range of motion.  No tenderness.  No cyanosis, clubbing, or edema.  2+ distal pulses.   Assessment:  Annual gynecologic examination with pap smear   Plan:  Will follow up results of pap smear and manage accordingly.  Routine preventative health maintenance measures emphasized. Please refer to After Visit Summary for other counseling recommendations.   Illene Bolus CNM [facility] [employer]

## 2015-10-29 NOTE — Addendum Note (Signed)
Addended by: Gita Kudo on: 10/29/2015 03:41 PM   Modules accepted: Orders

## 2015-11-03 LAB — CYTOLOGY - PAP

## 2015-11-07 ENCOUNTER — Telehealth: Payer: Self-pay | Admitting: Internal Medicine

## 2015-11-07 NOTE — Telephone Encounter (Signed)
Patient Name: Lori Huber DOB: [birth year] Initial Comment Caller states woke up this morning w/both eyes goopy and almost closed shut; still watery and red w/some discharge; sees Lori Kocher, NP; Nurse Assessment Nurse: Lori Elizabeth, RN, Lori Huber Date/Time (Eastern Time): 11/07/2015 10:35:29 AM Confirm and document reason for call. If symptomatic, describe symptoms. You must click the next button to save text entered. ---Lori Huber developed bilateral eye drainage this morning. No fever. Alert and responsive. Has the patient traveled out of the country within the last 30 days? ---Yes Where have you traveled? ([place] for Ebola and Ebola guideline, [place], [place] for MERS) ---[place] Does the patient have any new or worsening symptoms? ---Yes Will a triage be completed? ---Yes Related visit to physician within the last 2 weeks? ---No Does the PT have any chronic conditions? (i.e. diabetes, asthma, etc.) ---No Is the patient pregnant or possibly pregnant? (Ask all females between the ages of 11-55) ---No Is this a behavioral health or substance abuse call? ---No Guidelines Guideline Title Affirmed Question Affirmed Notes Eye - Pus or Discharge MODERATE eye pain (e.g., interferes with normal activities) Final Disposition User See Physician within 4 Hours (or PCP triage) Lori Elizabeth, RN, Lori Huber Comments No appointments available at [facility]. Lori Huber plans to go to [facility] urgent care facility. Referrals GO TO FACILITY OTHER - SPECIFY Disagree/Comply: Comply

## 2015-11-07 NOTE — Telephone Encounter (Signed)
Left detailed msg on VM per HIPAA  

## 2015-11-07 NOTE — Telephone Encounter (Signed)
UC if no appts available here

## 2015-11-12 ENCOUNTER — Ambulatory Visit (INDEPENDENT_AMBULATORY_CARE_PROVIDER_SITE_OTHER): Payer: BLUE CROSS/BLUE SHIELD | Admitting: Primary Care

## 2015-11-12 ENCOUNTER — Encounter: Payer: Self-pay | Admitting: Primary Care

## 2015-11-12 VITALS — BP 116/78 | HR 79 | Temp 97.9°F | Wt 246.8 lb

## 2015-11-12 DIAGNOSIS — H109 Unspecified conjunctivitis: Secondary | ICD-10-CM

## 2015-11-12 DIAGNOSIS — A499 Bacterial infection, unspecified: Secondary | ICD-10-CM | POA: Diagnosis not present

## 2015-11-12 DIAGNOSIS — H1089 Other conjunctivitis: Secondary | ICD-10-CM

## 2015-11-12 MED ORDER — POLYMYXIN B-TRIMETHOPRIM 10000-0.1 UNIT/ML-% OP SOLN: OPHTHALMIC | Status: DC

## 2015-11-12 NOTE — Patient Instructions (Signed)
Start trimethoprim-polymixin B eye drops. Instill 2 drops into both eyes four times daily for 5 to 7 days. Continue eye drops 3 days after symptoms resolve.  Do not wear make up until after the 3 day window after improvement.  It was a pleasure meeting you!  Bacterial Conjunctivitis Bacterial conjunctivitis, commonly called pink eye, is an inflammation of the clear membrane that covers the white part of the eye (conjunctiva). The inflammation can also happen on the underside of the eyelids. The blood vessels in the conjunctiva become inflamed, causing the eye to become red or pink. Bacterial conjunctivitis may spread easily from one eye to another and from person to person (contagious).  CAUSES  Bacterial conjunctivitis is caused by bacteria. The bacteria may come from your own skin, your upper respiratory tract, or from someone else with bacterial conjunctivitis. SYMPTOMS  The normally white color of the eye or the underside of the eyelid is usually pink or red. The pink eye is usually associated with irritation, tearing, and some sensitivity to light. Bacterial conjunctivitis is often associated with a thick, yellowish discharge from the eye. The discharge may turn into a crust on the eyelids overnight, which causes your eyelids to stick together. If a discharge is present, there may also be some blurred vision in the affected eye. DIAGNOSIS  Bacterial conjunctivitis is diagnosed by your caregiver through an eye exam and the symptoms that you report. Your caregiver looks for changes in the surface tissues of your eyes, which may point to the specific type of conjunctivitis. A sample of any discharge may be collected on a cotton-tip swab if you have a severe case of conjunctivitis, if your cornea is affected, or if you keep getting repeat infections that do not respond to treatment. The sample will be sent to a lab to see if the inflammation is caused by a bacterial infection and to see if the  infection will respond to antibiotic medicines. TREATMENT   Bacterial conjunctivitis is treated with antibiotics. Antibiotic eyedrops are most often used. However, antibiotic ointments are also available. Antibiotics pills are sometimes used. Artificial tears or eye washes may ease discomfort. HOME CARE INSTRUCTIONS   To ease discomfort, apply a cool, clean washcloth to your eye for 10-20 minutes, 3-4 times a day.  Gently wipe away any drainage from your eye with a warm, wet washcloth or a cotton ball.  Wash your hands often with soap and water. Use paper towels to dry your hands.  Do not share towels or washcloths. This may spread the infection.  Change or wash your pillowcase every day.  You should not use eye makeup until the infection is gone.  Do not operate machinery or drive if your vision is blurred.  Stop using contact lenses. Ask your caregiver how to sterilize or replace your contacts before using them again. This depends on the type of contact lenses that you use.  When applying medicine to the infected eye, do not touch the edge of your eyelid with the eyedrop bottle or ointment tube. SEEK IMMEDIATE MEDICAL CARE IF:   Your infection has not improved within 3 days after beginning treatment.  You had yellow discharge from your eye and it returns.  You have increased eye pain.  Your eye redness is spreading.  Your vision becomes blurred.  You have a fever or persistent symptoms for more than 2-3 days.  You have a fever and your symptoms suddenly get worse.  You have facial pain, redness, or swelling.  MAKE SURE YOU:   Understand these instructions.  Will watch your condition.  Will get help right away if you are not doing well or get worse.   This information is not intended to replace advice given to you by your health care provider. Make sure you discuss any questions you have with your health care provider.   Document Released: 08/30/2005 Document  Revised: 09/20/2014 Document Reviewed: 01/31/2012 Elsevier Interactive Patient Education [employer]

## 2015-11-12 NOTE — Progress Notes (Signed)
Pre visit review using our clinic review tool, if applicable. No additional management support is needed unless otherwise documented below in the visit note. 

## 2015-11-12 NOTE — Progress Notes (Signed)
Subjective:    Patient ID: Lori Huber, female    DOB: 1975-09-17, 40 y.o.   MRN: [id number]  HPI  Lori Huber is a 40 year old female who presents today with a chief complaint of conjunctivitis. She was evaluated at [facility] on 11/07/15 with complaints of bilateral eye irritation. She was diagnosed with bacterial conjunctivitis and prescribed ofloxacin 0.3% opthalmic drops.   She's been instilling 2 drops of her medication to both eyes three times daily for the past 6 days. She noticed nearly complete resolve yesterday without redness and drainage, until this morning. Yesterday morning she applied and worse old make-up and noticed increased redness and drainage to her eyes this morning. Denies fevers, cough, sore throat, rhinorrhea, itchy eyes. She is nearly out of her eye drops now.   Review of Systems  Constitutional: Negative for fever.  HENT: Negative for congestion and ear pain.   Eyes: Positive for discharge and redness. Negative for itching.       General discomfort, no pain  Respiratory: Negative for cough.        No past medical history on file.  Social History   Social History  . Marital Status: Married    Spouse Name: N/A  . Number of Children: N/A  . Years of Education: N/A   Occupational History  . Not on file.   Social History Main Topics  . Smoking status: Never Smoker   . Smokeless tobacco: Never Used  . Alcohol Use: 4.2 oz/week    7 Glasses of wine per week     Comment: occasional  . Drug Use: No  . Sexual Activity: Yes    Birth Control/ Protection: IUD   Other Topics Concern  . Not on file   Social History Narrative    Past Surgical History  Procedure Laterality Date  . Cholecystectomy  2011  . Knee surgery Left   . Kidney stone surgery  1996    Family History  Problem Relation Age of Onset  . Hypertension Father   . Hypertension Brother   . Hypertension Paternal Grandmother   . Stroke Paternal Grandmother     . Heart disease Paternal Grandmother   . Diabetes Paternal Grandmother   . Cancer Neg Hx     No Known Allergies  Current Outpatient Prescriptions on File Prior to Visit  Medication Sig Dispense Refill  . albuterol (PROVENTIL HFA;VENTOLIN HFA) 108 (90 BASE) MCG/ACT inhaler Inhale 1-2 puffs into the lungs every 6 (six) hours as needed for wheezing or shortness of breath. Reported on 09/03/2015    . ibuprofen (ADVIL,MOTRIN) 200 MG tablet Take 200 mg by mouth as needed.    Marland Kitchen levonorgestrel (MIRENA) 20 MCG/24HR IUD 1 each by Intrauterine route once.    . naproxen sodium (ALEVE) 220 MG tablet Take 220 mg by mouth as needed.     No current facility-administered medications on file prior to visit.    BP 116/78 mmHg  Pulse 79  Temp(Src) 97.9 F (36.6 C) (Oral)  Wt 246 lb 12.8 oz (111.948 kg)  SpO2 97%  LMP 11/11/2015    Objective:   Physical Exam  Constitutional: She appears well-nourished.  HENT:  Right Ear: Tympanic membrane and ear canal normal.  Left Ear: Tympanic membrane and ear canal normal.  Nose: Right sinus exhibits no maxillary sinus tenderness and no frontal sinus tenderness. Left sinus exhibits no maxillary sinus tenderness and no frontal sinus tenderness.  Mouth/Throat: Oropharynx is clear and moist.  Eyes: Pupils are equal, round, and reactive to light. Right eye exhibits exudate. Left eye exhibits exudate. Right conjunctiva is injected. Left conjunctiva is injected.  Neck: Neck supple.          Assessment & Plan:  Conjunctivitis:  Treated at urgent care last week, nearly complete resolve until applying old make up yesterday. Today woke up with same symptoms. Nearly out of meds. Exam with moderate injection and light green discharge from bilateral conjunctiva. RX for Trimethoprim-Polymixin B gttts to use 5-7 days, including 3 extra days once symptoms resolve to ensure infection has healed. Exam otherwise unremarkable. Return precautions provided.

## 2016-04-28 DIAGNOSIS — H6123 Impacted cerumen, bilateral: Secondary | ICD-10-CM | POA: Diagnosis not present

## 2016-08-24 ENCOUNTER — Ambulatory Visit (INDEPENDENT_AMBULATORY_CARE_PROVIDER_SITE_OTHER): Payer: BLUE CROSS/BLUE SHIELD | Admitting: Internal Medicine

## 2016-08-24 ENCOUNTER — Encounter: Payer: Self-pay | Admitting: Internal Medicine

## 2016-08-24 ENCOUNTER — Encounter (INDEPENDENT_AMBULATORY_CARE_PROVIDER_SITE_OTHER): Payer: Self-pay

## 2016-08-24 VITALS — BP 120/82 | HR 92 | Temp 98.7°F | Wt 255.0 lb

## 2016-08-24 DIAGNOSIS — L609 Nail disorder, unspecified: Secondary | ICD-10-CM

## 2016-08-24 DIAGNOSIS — M25531 Pain in right wrist: Secondary | ICD-10-CM | POA: Diagnosis not present

## 2016-08-24 DIAGNOSIS — F411 Generalized anxiety disorder: Secondary | ICD-10-CM | POA: Diagnosis not present

## 2016-08-24 MED ORDER — SERTRALINE HCL 25 MG PO TABS: 25.0000 mg | ORAL_TABLET | Freq: Every day | ORAL | 2 refills | Status: DC

## 2016-08-24 NOTE — Progress Notes (Signed)
Subjective:    Patient ID: Lori HalimStaci L Saltz Spieker, female    DOB: 03/10/1976, 40 y.o.   MRN: [id number]  HPI  Pt presents to the clinic today with c/o anxiety. She reports she noticed this about 1 month ago. She reports episodes of chest tightness, palpitations and mild shortness of breath. She usually notices in the evenings, when she settles down at night. She reports she probably feels this way during the day, but is too busy to notice. She reports just general stress is triggering this. She is able to distract herself, take deep breaths and get herself calmed down. She has never been treated for this in the past. She denies depression, SI/HI.  She also c/o bruising of her bilateral great toenail. She noticed this 1-2 months ago. She denies any trauma to the area. She reports the nail feels dead in those areas, but is not painful. She has not tried anything OTC for this.  She also c/o right wrist pain. This started after a fall that occurred 3 weeks ago while she was walking through a parking lot. She landed on her right hand and right knee. She did have some mild swelling but denies bruising of the area. She describes the pain as achy at rest, but sharp and stabbing with movement. Movement makes the pain worse. She denies numbness or tingling of the right hands. She has been wearing a wrist brace with some relief.   Review of Systems      No past medical history on file.  Current Outpatient Prescriptions  Medication Sig Dispense Refill  . albuterol (PROVENTIL HFA;VENTOLIN HFA) 108 (90 BASE) MCG/ACT inhaler Inhale 1-2 puffs into the lungs every 6 (six) hours as needed for wheezing or shortness of breath. Reported on 09/03/2015    . ibuprofen (ADVIL,MOTRIN) 200 MG tablet Take 200 mg by mouth as needed.    Marland Kitchen. levonorgestrel (MIRENA) 20 MCG/24HR IUD 1 each by Intrauterine route once.    . naproxen sodium (ALEVE) 220 MG tablet Take 220 mg by mouth as needed.    . trimethoprim-polymyxin b  (POLYTRIM) ophthalmic solution Instill 2 drops to both eyes four times daily for 5 to 7 days. 10 mL 0   No current facility-administered medications for this visit.     No Known Allergies  Family History  Problem Relation Age of Onset  . Hypertension Father   . Hypertension Brother   . Hypertension Paternal Grandmother   . Stroke Paternal Grandmother   . Heart disease Paternal Grandmother   . Diabetes Paternal Grandmother   . Cancer Neg Hx     Social History   Social History  . Marital status: Married    Spouse name: N/A  . Number of children: N/A  . Years of education: N/A   Occupational History  . Not on file.   Social History Main Topics  . Smoking status: Never Smoker  . Smokeless tobacco: Never Used  . Alcohol use 4.2 oz/week    7 Glasses of wine per week     Comment: occasional  . Drug use: No  . Sexual activity: Yes    Birth control/ protection: IUD   Other Topics Concern  . Not on file   Social History Narrative  . No narrative on file     Constitutional: Denies fever, malaise, fatigue, headache or abrupt weight changes.  HEENT: Denies eye pain, eye redness, ear pain, ringing in the ears, wax buildup, runny nose, nasal congestion, bloody nose, or  sore throat. Respiratory: Pt reports intermittent shortness of breath. Denies difficulty breathing, cough or sputum production.   Cardiovascular: Pt reports intermittent chest tightness. Denies chest pain, palpitations or swelling in the hands or feet.  Musculoskeletal: Pt reports right wrist pain. Denies decrease in range of motion, difficulty with gait, muscle pain or joint swelling.  Skin: Pt reports discoloration of bilateral great toenails. Denies redness, rashes, lesions or ulcercations.   Psych: Pt reports anxiety. Denies depression, SI/HI.  No other specific complaints in a complete review of systems (except as listed in HPI above).  Objective:   Physical Exam   BP 120/82   Pulse 92   Temp 98.7  F (37.1 C) (Oral)   Wt 255 lb (115.7 kg)   LMP 08/08/2016   SpO2 98%   BMI 40.54 kg/m  Wt Readings from Last 3 Encounters:  08/24/16 255 lb (115.7 kg)  11/12/15 246 lb 12.8 oz (111.9 kg)  10/29/15 245 lb (111.1 kg)    General: Appears her stated age, obese in NAD. Skin: Black/purple discoloration noted of lateral great toenails bilaterally. The left great toenail appears to be lifting up. Cardiovascular: Normal rate and rhythm. S1,S2 noted. Right radial pulse 2+. Pulmonary/Chest: Normal effort and positive vesicular breath sounds. No respiratory distress. No wheezes, rales or ronchi noted.  AMusculoskeletal: Normal flexion and extension of the right wrist. Pain at the distal ulna with rotation, abduction and adduction. No pain with palpation over the distal ulna. No bruising or swelling noted. Neurological: Alert and oriented. Sensation intact to right hand.  BMET    Component Value Date/Time   NA 137 06/04/2015 1629   K 4.2 06/04/2015 1629   CL 102 06/04/2015 1629   CO2 27 06/04/2015 1629   GLUCOSE 80 06/04/2015 1629   BUN 17 06/04/2015 1629   CREATININE 0.77 06/04/2015 1629   CALCIUM 9.4 06/04/2015 1629    Lipid Panel     Component Value Date/Time   CHOL 172 11/26/2013 1421   TRIG 118.0 11/26/2013 1421   HDL 39.80 11/26/2013 1421   CHOLHDL 4 11/26/2013 1421   VLDL 23.6 11/26/2013 1421   LDLCALC 109 (H) 11/26/2013 1421    CBC    Component Value Date/Time   WBC 8.9 06/04/2015 1629   RBC 4.34 06/04/2015 1629   HGB 13.6 06/04/2015 1629   HCT 40.3 06/04/2015 1629   PLT 336.0 06/04/2015 1629   MCV 92.9 06/04/2015 1629   MCHC 33.8 06/04/2015 1629   RDW 13.2 06/04/2015 1629   LYMPHSABS 2.3 06/04/2015 1629   MONOABS 0.8 06/04/2015 1629   EOSABS 0.2 06/04/2015 1629   BASOSABS 0.0 06/04/2015 1629    Hgb A1C Lab Results  Component Value Date   HGBA1C 5.5 11/26/2013        Assessment & Plan:   GAD:  Triggered by stress She is not interested in referral  to therapy for CBT at this time Support offered today Discussed stress management techniques eRx for Zoloft 25 mg daily, discussed medication benefits vs risk/side effects Follow up with me in 4-6 weeks via mychart  Right wrist pain:  Likely a sprain Will hold off on xray for now Take Ibuprofen as needed for pain Ice it after work Wear the brace while working, off at night  Nail problem, bilateral:  No known trauma Referral placed to podiatry for further evaluation and treatment  RTC as needed or if symptoms persist or worsen Tesla Keeler, NP

## 2016-08-24 NOTE — Patient Instructions (Signed)
Generalized Anxiety Disorder Generalized anxiety disorder (GAD) is a mental disorder. It interferes with life functions, including relationships, work, and school. GAD is different from normal anxiety, which everyone experiences at some point in their lives in response to specific life events and activities. Normal anxiety actually helps us prepare for and get through these life events and activities. Normal anxiety goes away after the event or activity is over.  GAD causes anxiety that is not necessarily related to specific events or activities. It also causes excess anxiety in proportion to specific events or activities. The anxiety associated with GAD is also difficult to control. GAD can vary from mild to severe. People with severe GAD can have intense waves of anxiety with physical symptoms (panic attacks).  SYMPTOMS The anxiety and worry associated with GAD are difficult to control. This anxiety and worry are related to many life events and activities and also occur more days than not for 6 months or longer. People with GAD also have three or more of the following symptoms (one or more in children):  Restlessness.   Fatigue.  Difficulty concentrating.   Irritability.  Muscle tension.  Difficulty sleeping or unsatisfying sleep. DIAGNOSIS GAD is diagnosed through an assessment by your health care provider. Your health care provider will ask you questions aboutyour mood,physical symptoms, and events in your life. Your health care provider may ask you about your medical history and use of alcohol or drugs, including prescription medicines. Your health care provider may also do a physical exam and blood tests. Certain medical conditions and the use of certain substances can cause symptoms similar to those associated with GAD. Your health care provider may refer you to a mental health specialist for further evaluation. TREATMENT The following therapies are usually used to treat GAD:    Medication. Antidepressant medication usually is prescribed for long-term daily control. Antianxiety medicines may be added in severe cases, especially when panic attacks occur.   Talk therapy (psychotherapy). Certain types of talk therapy can be helpful in treating GAD by providing support, education, and guidance. A form of talk therapy called cognitive behavioral therapy can teach you healthy ways to think about and react to daily life events and activities.  Stress managementtechniques. These include yoga, meditation, and exercise and can be very helpful when they are practiced regularly. A mental health specialist can help determine which treatment is best for you. Some people see improvement with one therapy. However, other people require a combination of therapies. This information is not intended to replace advice given to you by your health care provider. Make sure you discuss any questions you have with your health care provider. Document Released: 12/25/2012 Document Revised: 09/20/2014 Document Reviewed: 12/25/2012 Elsevier Interactive Patient Education  2017 [employer]  

## 2016-08-29 ENCOUNTER — Encounter: Payer: Self-pay | Admitting: Internal Medicine

## 2016-08-29 ENCOUNTER — Encounter: Payer: Self-pay | Admitting: Urgent Care

## 2016-08-29 ENCOUNTER — Emergency Department: Payer: BLUE CROSS/BLUE SHIELD

## 2016-08-29 ENCOUNTER — Emergency Department
Admission: EM | Admit: 2016-08-29 | Discharge: 2016-08-29 | Disposition: A | Discharge status: Home / Self Care | Payer: BLUE CROSS/BLUE SHIELD | Attending: Emergency Medicine | Admitting: Emergency Medicine

## 2016-08-29 ENCOUNTER — Telehealth: Payer: Self-pay | Admitting: Emergency Medicine

## 2016-08-29 DIAGNOSIS — Z791 Long term (current) use of non-steroidal anti-inflammatories (NSAID): Secondary | ICD-10-CM | POA: Insufficient documentation

## 2016-08-29 DIAGNOSIS — R079 Chest pain, unspecified: Secondary | ICD-10-CM | POA: Diagnosis not present

## 2016-08-29 DIAGNOSIS — R0789 Other chest pain: Secondary | ICD-10-CM | POA: Diagnosis present

## 2016-08-29 DIAGNOSIS — R0602 Shortness of breath: Secondary | ICD-10-CM | POA: Diagnosis not present

## 2016-08-29 DIAGNOSIS — Z79899 Other long term (current) drug therapy: Secondary | ICD-10-CM | POA: Insufficient documentation

## 2016-08-29 LAB — BASIC METABOLIC PANEL
Anion gap: 9 (ref 5–15)
BUN: 13 mg/dL (ref 6–20)
CHLORIDE: 107 mmol/L (ref 101–111)
CO2: 21 mmol/L — AB (ref 22–32)
CREATININE: 0.74 mg/dL (ref 0.44–1.00)
Calcium: 9 mg/dL (ref 8.9–10.3)
GFR calc Af Amer: >60 mL/min (ref >60)
GFR calc non Af Amer: >60 mL/min (ref >60)
Glucose, Bld: 111 mg/dL — ABNORMAL HIGH (ref 65–99)
Potassium: 3.7 mmol/L (ref 3.5–5.1)
Sodium: 137 mmol/L (ref 135–145)

## 2016-08-29 LAB — CBC
HCT: 39.5 % (ref 35.0–47.0)
Hemoglobin: 13.8 g/dL (ref 12.0–16.0)
MCH: 31.3 pg (ref 26.0–34.0)
MCHC: 35.1 g/dL (ref 32.0–36.0)
MCV: 89.3 fL (ref 80.0–100.0)
PLATELETS: 299 10*3/uL (ref 150–440)
RBC: 4.42 MIL/uL (ref 3.80–5.20)
RDW: 13 % (ref 11.5–14.5)
WBC: 10.4 10*3/uL (ref 3.6–11.0)

## 2016-08-29 LAB — FIBRIN DERIVATIVES D-DIMER (ARMC ONLY): Fibrin derivatives D-dimer (ARMC): 300 (ref 0–499)

## 2016-08-29 LAB — TROPONIN I
Troponin I: <0.03 ng/mL (ref <0.03)
Troponin I: <0.03 ng/mL (ref <0.03)

## 2016-08-29 MED ORDER — ONDANSETRON HCL 4 MG/2ML IJ SOLN
INTRAMUSCULAR | Status: AC
  Administered 2016-08-29: 4 mg via INTRAVENOUS
  Filled 2016-08-29: qty 2

## 2016-08-29 MED ORDER — GI COCKTAIL ~~LOC~~
30.0000 mL | Freq: Once | ORAL | Status: AC
  Administered 2016-08-29: 30 mL via ORAL
  Filled 2016-08-29: qty 30

## 2016-08-29 MED ORDER — ONDANSETRON HCL 4 MG/2ML IJ SOLN
4.0000 mg | Freq: Once | INTRAMUSCULAR | Status: AC
  Administered 2016-08-29: 4 mg via INTRAVENOUS

## 2016-08-29 NOTE — ED Notes (Signed)
Pt. States symptoms of nausea/vomiting/dirrhea started at 9 pm last night.  Pt. Denies anyone in house with same symptoms.

## 2016-08-29 NOTE — Discharge Instructions (Signed)
Lab work all looks good. Please follow-up with your family doctor in the cardiologist. The person on call is Dr[facility]. If you call their office and tell them you were in the emergency room with chest pain they should be able to see within the next few days.

## 2016-08-29 NOTE — ED Provider Notes (Signed)
[facility] Emergency Department Provider Note   ____________________________________________   First MD Initiated Contact with Patient 08/29/16 [phone]     (approximate)  I have reviewed the triage vital signs and the nursing notes.   HISTORY  Chief Complaint Chest Pain; Nausea; and Emesis    HPI Lori Huber is a 40 y.o. female who reports she got tight in her chest about 9:00 last night. It wouldn't go away although sometimes she has things like this and does go away. Then at 10:00 she got chills and began having vomiting. She has vomited multiple times since then. Chest feels like there is a belt around her that somebody is pulling on to make it tight. He really does not actually hurt. Patient does not have pleuritic chest pain. She is not having actually had any fever. She has no abdominal pain. She did have diarrhea a couple times.   History reviewed. No pertinent past medical history.  Patient Active Problem List   Diagnosis Date Noted  . Well woman exam 10/29/2015  . MIGRAINES, HX OF 10/23/2008    Past Surgical History:  Procedure Laterality Date  . CHOLECYSTECTOMY  2011  . KIDNEY STONE SURGERY  1996  . KNEE SURGERY Left     Prior to Admission medications   Medication Sig Start Date End Date Taking? Authorizing Provider  albuterol (PROVENTIL HFA;VENTOLIN HFA) 108 (90 BASE) MCG/ACT inhaler Inhale 1-2 puffs into the lungs every 6 (six) hours as needed for wheezing or shortness of breath. Reported on 09/03/2015    Historical Provider, MD  ibuprofen (ADVIL,MOTRIN) 200 MG tablet Take 200 mg by mouth as needed.    Historical Provider, MD  levonorgestrel (MIRENA) 20 MCG/24HR IUD 1 each by Intrauterine route once.    Historical Provider, MD  naproxen sodium (ALEVE) 220 MG tablet Take 220 mg by mouth as needed.    Historical Provider, MD  sertraline (ZOLOFT) 25 MG tablet Take 1 tablet (25 mg total) by mouth at bedtime. 08/24/16   Lorre Munroeegina W  Baity, NP  trimethoprim-polymyxin b (POLYTRIM) ophthalmic solution Instill 2 drops to both eyes four times daily for 5 to 7 days. 11/12/15   Doreene NestKatherine K Clark, NP    Allergies Patient has no known allergies.  Family History  Problem Relation Age of Onset  . Hypertension Father   . Hypertension Brother   . Hypertension Paternal Grandmother   . Stroke Paternal Grandmother   . Heart disease Paternal Grandmother   . Diabetes Paternal Grandmother   . Cancer Neg Hx     Social History Social History  Substance Use Topics  . Smoking status: Never Smoker  . Smokeless tobacco: Never Used  . Alcohol use No     Comment: occasional    Review of Systems Constitutional: No fever   Has had chills Eyes: No visual changes. ENT: No sore throat. Cardiovascular: Denies chest pain. Respiratory: Some shortness of breath. Gastrointestinal: No abdominal pain.   nausea,  vomiting.   diarrhea.  No constipation. Genitourinary: Negative for dysuria. Musculoskeletal: Negative for back pain. Skin: Negative for rash. Neurological: Negative for headaches, focal weakness or numbness.  10-point ROS otherwise negative.  ____________________________________________   PHYSICAL EXAM:  VITAL SIGNS: ED Triage Vitals  Enc Vitals Group     BP 08/29/16 0335 136/83     Pulse Rate 08/29/16 0335 84     Resp 08/29/16 0335 20     Temp 08/29/16 0335 98.3 F (36.8 C)  Temp Source 08/29/16 0335 Oral     SpO2 08/29/16 0335 99 %     Weight 08/29/16 0336 250 lb (113.4 kg)     Height 08/29/16 0336 5\' 7"  (1.702 m)     Head Circumference --      Peak Flow --      Pain Score 08/29/16 0336 8     Pain Loc --      Pain Edu? --      Excl. in GC? --     Constitutional: Alert and oriented. Well appearing and in no acute distress. Eyes: Conjunctivae are normal. PERRL. EOMI. Head: Atraumatic. Nose: No congestion/rhinnorhea. Mouth/Throat: Mucous membranes are moist.  Oropharynx non-erythematous. Neck: No  stridor. Cardiovascular: Normal rate, regular rhythm. Grossly normal heart sounds.  Good peripheral circulation. Respiratory: Normal respiratory effort.  No retractions. Lungs CTAB. Gastrointestinal: Soft and nontender. No distention. No abdominal bruits. No CVA tenderness. Musculoskeletal: No lower extremity tenderness nor edema.  No joint effusions. Neurologic:  Normal speech and language. No gross focal neurologic deficits are appreciated. No gait instability. Skin:  Skin is warm, dry and intact. No rash noted.   ____________________________________________   LABS (all labs ordered are listed, but only abnormal results are displayed)  Labs Reviewed  BASIC METABOLIC PANEL - Abnormal; Notable for the following:       Result Value   CO2 21 (*)    Glucose, Bld 111 (*)    All other components within normal limits  CBC  TROPONIN I  TROPONIN I  FIBRIN DERIVATIVES D-DIMER ([facility] ONLY)   ____________________________________________  EKG  EKG read and interpreted by me shows normal sinus rhythm at a rate of 80 normal axis and borderline ST-T changes in the precordium. There is also asked in lead 1 and a very small Q in lead 3 had to have T-wave inversion in lead 3. ____________________________________________  RADIOLOGY Study Result   CLINICAL DATA:  Chest tightness, nausea vomiting, shortness of breath. Feeling anxious.  EXAM: CHEST  2 VIEW  COMPARISON:  None.  FINDINGS: Cardiomediastinal silhouette is normal. No pleural effusions or focal consolidations. Trachea projects midline and there is no pneumothorax. Soft tissue planes and included osseous structures are non-suspicious. Surgical clips in the included right abdomen compatible with cholecystectomy.  IMPRESSION: Normal chest radiograph.   Electronically Signed   By: Awilda Metroourtnay  Bloomer M.D.   On: 08/29/2016 04:47     ____________________________________________   PROCEDURES  Procedure(s) performed:     Procedures  Critical Care performed:   ____________________________________________   INITIAL IMPRESSION / ASSESSMENT AND PLAN / ED COURSE  Pertinent labs & imaging results that were available during my care of the patient were reviewed by me and considered in my medical decision making (see chart for details).    Clinical Course      ____________________________________________   FINAL CLINICAL IMPRESSION(S) / ED DIAGNOSES  Final diagnoses:  Chest pain, unspecified type      NEW MEDICATIONS STARTED DURING THIS VISIT:  Discharge Medication List as of 08/29/2016  7:20 AM       Note:  This document was prepared using Dragon voice recognition software and may include unintentional dictation errors.    Arnaldo NatalPaul F Alton Bouknight, MD 08/29/16 [phone]

## 2016-08-29 NOTE — ED Notes (Signed)
Pt. Back from x-ray, nausea improved.

## 2016-08-29 NOTE — ED Notes (Signed)
Pt. States in the past week starting zoloft.

## 2016-08-29 NOTE — ED Notes (Signed)
After GI cocktail, pt reports chest tightness has decreased. Pt offered morphine, pt declined and reports she is ready to go home.

## 2016-08-29 NOTE — ED Triage Notes (Addendum)
Patient presents to the ED from home with c/o chest tightness, nausea, vomiting, SOB, and reports for feeling very anxious. Patient recently started Sertraline 25mg  daily; considering medication reaction. Patient has vomited x 3 since the onset of symptoms.

## 2016-08-30 ENCOUNTER — Encounter: Payer: Self-pay | Admitting: Internal Medicine

## 2016-08-30 ENCOUNTER — Ambulatory Visit (INDEPENDENT_AMBULATORY_CARE_PROVIDER_SITE_OTHER): Payer: BLUE CROSS/BLUE SHIELD | Admitting: Internal Medicine

## 2016-08-30 ENCOUNTER — Telehealth: Payer: Self-pay

## 2016-08-30 VITALS — BP 118/74 | HR 94 | Temp 99.4°F | Wt 252.0 lb

## 2016-08-30 DIAGNOSIS — R112 Nausea with vomiting, unspecified: Secondary | ICD-10-CM

## 2016-08-30 DIAGNOSIS — J029 Acute pharyngitis, unspecified: Secondary | ICD-10-CM | POA: Diagnosis not present

## 2016-08-30 DIAGNOSIS — T887XXD Unspecified adverse effect of drug or medicament, subsequent encounter: Secondary | ICD-10-CM

## 2016-08-30 DIAGNOSIS — F411 Generalized anxiety disorder: Secondary | ICD-10-CM

## 2016-08-30 DIAGNOSIS — J020 Streptococcal pharyngitis: Secondary | ICD-10-CM

## 2016-08-30 DIAGNOSIS — R0789 Other chest pain: Secondary | ICD-10-CM | POA: Diagnosis not present

## 2016-08-30 DIAGNOSIS — T50905D Adverse effect of unspecified drugs, medicaments and biological substances, subsequent encounter: Secondary | ICD-10-CM

## 2016-08-30 LAB — POCT RAPID STREP A (OFFICE): Rapid Strep A Screen: POSITIVE — AB

## 2016-08-30 MED ORDER — AMOXICILLIN 875 MG PO TABS: 875.0000 mg | ORAL_TABLET | Freq: Two times a day (BID) | ORAL | 0 refills | Status: DC

## 2016-08-30 NOTE — Telephone Encounter (Signed)
PLEASE NOTE: All timestamps contained within this report are represented as [place]. CONFIDENTIALTY NOTICE: This fax transmission is intended only for the addressee. It contains information that is legally privileged, confidential or otherwise protected from use or disclosure. If you are not the intended recipient, you are strictly prohibited from reviewing, disclosing, copying using or disseminating any of this information or taking any action in reliance on or regarding this information. If you have received this fax in error, please notify us immediately by telephone so that we can arrange for its return to us. Phone: [phone], Toll-Free: [phone], Fax: [phone] Page: 1 of 2 Call Id: [id number] [facility] - Client TELEPHONE ADVICE RECORD [facility] Patient Name: Lori Huber Gender: Female DOB: 05/05/1976 Age: [age] Y 5 M 11 D Return Phone Number: [phone] (Primary), [phone] (Secondary) Address: City/State/Zip: [place] Client [facility] Physician Nicki ReaperBaity, Regina - NP Contact Type Call Who Is Calling Patient / Member / Family / Caregiver Call Type Triage / Clinical Relationship To Patient Self Return Phone Number [phone] (Primary) Chief Complaint BREATHING - shortness of breath or sounds breathless Reason for Call Symptomatic / Request for Health Information Initial Comment Caller states she is having tightness in her chest and vomiting after taking new meds. She is short of breath. PreDisposition InappropriateToAsk Translation No Nurse Assessment Nurse: Dorothyann GibbsHenard, RN, Melene PlanKaci Date/Time (Eastern Time): 08/29/2016 3:14:11 AM Confirm and document reason for call. If symptomatic, describe symptoms. ---Caller states she is having tightness in her chest and vomiting(3 times), diarrhea, after taking new meds. She is  short of breath. Does the patient have any new or worsening symptoms? ---Yes Will a triage be completed? ---Yes Related visit to physician within the last 2 weeks? ---N/A Does the PT have any chronic conditions? (i.e. diabetes, asthma, etc.) ---No Is the patient pregnant or possibly pregnant? (Ask all females between the ages of 212-55) ---No Is this a behavioral health or substance abuse call? ---No Guidelines Guideline Title Affirmed Question Affirmed Notes Nurse Date/Time (Eastern Time) Chest Pain [1] Chest pain lasts > 5 minutes AND [2] described as crushing, pressure-like, or heavy Henard, RN, Kaci 08/29/2016 3:14:59 AM Disp. Time Lamount Cohen(Eastern Time) Disposition Final User 08/29/2016 3:11:42 AM Send to Urgent Queue Dennison BullaMonroe, Jason 08/29/2016 3:18:33 AM 911 Outcome Documentation Henard, RN, Melene PlanKaci Reason: Caller states she is not going to call 911 and is going to drive to the ED. PLEASE NOTE: All timestamps contained within this report are represented as [place]. CONFIDENTIALTY NOTICE: This fax transmission is intended only for the addressee. It contains information that is legally privileged, confidential or otherwise protected from use or disclosure. If you are not the intended recipient, you are strictly prohibited from reviewing, disclosing, copying using or disseminating any of this information or taking any action in reliance on or regarding this information. If you have received this fax in error, please notify us immediately by telephone so that we can arrange for its return to us. Phone: [phone], Toll-Free: [phone], Fax: [phone] Page: 2 of 2 Call Id: [id number] 08/29/2016 3:17:57 AM Call EMS 911 Now Yes Henard, RN, Katherene PontoKaci Caller Understands: Yes Disagree/Comply: Disagree Disagree/Comply Reason: Disagree with instructions Care Advice Given Per Guideline CALL EMS 911 NOW: Immediate medical attention is needed. You need to hang up and call 911 (or an  ambulance). ([facility] Discretion: I'll call you back in a few minutes to  be sure you were able to reach them.) CARE ADVICE given per Chest Pain (Adult) guideline.

## 2016-08-30 NOTE — Progress Notes (Signed)
Subjective:    Patient ID: Lori HalimStaci L Huber Huber, female    DOB: 10/04/1975, 40 y.o.   MRN: [id number]  HPI  Pt presents to the clinic today for ER follow up. She went to the ER 08/29/16 with c/o chest pain accompanied by chills, nausea and vomiting. She felt like this was a medication reaction secondary starting Zoloft on 08/25/16. She had not had sick contacts with similar symptoms.  Labs were unremarkable. ECG did not show any acute findings. Chest xray was normal. She was given a GI Cocktail with good relief. Since discharge, she reports she has continue nausea but no vomiting. She does not feel well. She has developed a sore throat with difficulty swallowing. She has a non productive cough. She reports ongoing chest tightness at times but no chest pain or shortness of breath. She has been taking Dramamine with some relief. She stopped the Zoloft.  Review of Systems      No past medical history on file.  Current Outpatient Prescriptions  Medication Sig Dispense Refill  . albuterol (PROVENTIL HFA;VENTOLIN HFA) 108 (90 BASE) MCG/ACT inhaler Inhale 1-2 puffs into the lungs every 6 (six) hours as needed for wheezing or shortness of breath. Reported on 09/03/2015    . ibuprofen (ADVIL,MOTRIN) 200 MG tablet Take 200 mg by mouth as needed.    Marland Kitchen. levonorgestrel (MIRENA) 20 MCG/24HR IUD 1 each by Intrauterine route once.    . naproxen sodium (ALEVE) 220 MG tablet Take 220 mg by mouth as needed.    . sertraline (ZOLOFT) 25 MG tablet Take 1 tablet (25 mg total) by mouth at bedtime. 30 tablet 2  . trimethoprim-polymyxin b (POLYTRIM) ophthalmic solution Instill 2 drops to both eyes four times daily for 5 to 7 days. 10 mL 0   No current facility-administered medications for this visit.     No Known Allergies  Family History  Problem Relation Age of Onset  . Hypertension Father   . Hypertension Brother   . Hypertension Paternal Grandmother   . Stroke Paternal Grandmother   . Heart disease  Paternal Grandmother   . Diabetes Paternal Grandmother   . Cancer Neg Hx     Social History   Social History  . Marital status: Married    Spouse name: N/A  . Number of children: N/A  . Years of education: N/A   Occupational History  . Not on file.   Social History Main Topics  . Smoking status: Never Smoker  . Smokeless tobacco: Never Used  . Alcohol use No     Comment: occasional  . Drug use: No  . Sexual activity: Yes    Birth control/ protection: IUD   Other Topics Concern  . Not on file   Social History Narrative  . No narrative on file     Constitutional: Pt reports fatigue. Denies fever, malaise, headache or abrupt weight changes.  HEENT: Pt reports sore throat. Denies eye pain, eye redness, ear pain, ringing in the ears, wax buildup, runny nose, nasal congestion, bloody nose. Respiratory: Pt reports cough. Denies difficulty breathing, shortness of breath, or sputum production.   Cardiovascular: Pt reports chest tightness. Denies chest pain, palpitations or swelling in the hands or feet.  Gastrointestinal: Pt reports nausea. Denies abdominal pain, bloating, constipation, diarrhea or blood in the stool.   No other specific complaints in a complete review of systems (except as listed in HPI above).  Objective:   Physical Exam   BP 118/74   Pulse  94   Temp 99.4 F (37.4 C) (Oral)   Wt 252 lb (114.3 kg)   LMP 08/08/2016   SpO2 98%   BMI 39.47 kg/m  Wt Readings from Last 3 Encounters:  08/30/16 252 lb (114.3 kg)  08/29/16 250 lb (113.4 kg)  08/24/16 255 lb (115.7 kg)    General: Appears her stated age, in NAD. Skin: Warm, dry and intact. No rashes noted. HEENT: Head: normal shape and size; Ears: bilateral cerumen impaction; Throat/Mouth: Teeth present, mucosa erythmeatous and moist, white exudate noted on left tonsillar pillar.  Neck:  No adenopathy noted.  Cardiovascular: Normal rate and rhythm. S1,S2 noted.  No murmur, rubs or gallops noted. No JVD  or BLE edema. No carotid bruits noted. Pulmonary/Chest: Normal effort and positive vesicular breath sounds. No respiratory distress. No wheezes, rales or ronchi noted.  Abdomen: Soft and nontender. Normal bowel sounds. No distention or masses noted.    BMET    Component Value Date/Time   NA 137 08/29/2016 0410   K 3.7 08/29/2016 0410   CL 107 08/29/2016 0410   CO2 21 (L) 08/29/2016 0410   GLUCOSE 111 (H) 08/29/2016 0410   BUN 13 08/29/2016 0410   CREATININE 0.74 08/29/2016 0410   CALCIUM 9.0 08/29/2016 0410   GFRNONAA >60 08/29/2016 0410   GFRAA >60 08/29/2016 0410    Lipid Panel     Component Value Date/Time   CHOL 172 11/26/2013 1421   TRIG 118.0 11/26/2013 1421   HDL 39.80 11/26/2013 1421   CHOLHDL 4 11/26/2013 1421   VLDL 23.6 11/26/2013 1421   LDLCALC 109 (H) 11/26/2013 1421    CBC    Component Value Date/Time   WBC 10.4 08/29/2016 0410   RBC 4.42 08/29/2016 0410   HGB 13.8 08/29/2016 0410   HCT 39.5 08/29/2016 0410   PLT 299 08/29/2016 0410   MCV 89.3 08/29/2016 0410   MCH 31.3 08/29/2016 0410   MCHC 35.1 08/29/2016 0410   RDW 13.0 08/29/2016 0410   LYMPHSABS 2.3 06/04/2015 1629   MONOABS 0.8 06/04/2015 1629   EOSABS 0.2 06/04/2015 1629   BASOSABS 0.0 06/04/2015 1629    Hgb A1C Lab Results  Component Value Date   HGBA1C 5.5 11/26/2013           Assessment & Plan:   ER follow up for chest pain, nausea and vomiting:  ER notes, labs and imaging reviewed Sounds more viral than medication reaction She insist on stopping Zoloft anyway and does not want to take anything in its place for her anxiety She wants to continue Dramamine She declines RX for Zofran No further intervention at this time She will monitor symptoms and let me know if they are worsening  Strep throat:   RST: positive eRx for Amoxil BID x 10 days Ibuprofen as needed for fever, inflammation or pain  RTC as needed or if symptoms persist or worsen BAITY, REGINA, NP

## 2016-08-30 NOTE — Patient Instructions (Signed)

## 2016-08-30 NOTE — Addendum Note (Signed)
Addended by: Roena MaladyEVONTENNO, Layney Gillson Y on: 08/30/2016 04:37 PM   Modules accepted: Orders

## 2016-08-30 NOTE — Telephone Encounter (Signed)
Per chart review tab [facility].

## 2016-08-30 NOTE — Telephone Encounter (Signed)
noted 

## 2016-08-31 ENCOUNTER — Ambulatory Visit: Payer: BLUE CROSS/BLUE SHIELD | Admitting: Internal Medicine

## 2016-09-02 ENCOUNTER — Encounter: Payer: Self-pay | Admitting: Internal Medicine

## 2016-09-03 ENCOUNTER — Encounter: Payer: Self-pay | Admitting: Internal Medicine

## 2016-09-10 ENCOUNTER — Ambulatory Visit: Payer: Self-pay | Admitting: Internal Medicine

## 2016-09-30 ENCOUNTER — Ambulatory Visit: Payer: Self-pay | Admitting: Internal Medicine

## 2016-10-04 ENCOUNTER — Ambulatory Visit (INDEPENDENT_AMBULATORY_CARE_PROVIDER_SITE_OTHER): Payer: BLUE CROSS/BLUE SHIELD | Admitting: Internal Medicine

## 2016-10-04 ENCOUNTER — Encounter: Payer: Self-pay | Admitting: Internal Medicine

## 2016-10-04 VITALS — BP 118/78 | HR 80 | Temp 98.2°F | Wt 246.0 lb

## 2016-10-04 DIAGNOSIS — R519 Headache, unspecified: Secondary | ICD-10-CM

## 2016-10-04 DIAGNOSIS — R197 Diarrhea, unspecified: Secondary | ICD-10-CM | POA: Diagnosis not present

## 2016-10-04 DIAGNOSIS — R51 Headache: Secondary | ICD-10-CM

## 2016-10-04 DIAGNOSIS — R202 Paresthesia of skin: Secondary | ICD-10-CM

## 2016-10-04 DIAGNOSIS — F411 Generalized anxiety disorder: Secondary | ICD-10-CM

## 2016-10-04 DIAGNOSIS — R5383 Other fatigue: Secondary | ICD-10-CM

## 2016-10-04 DIAGNOSIS — R112 Nausea with vomiting, unspecified: Secondary | ICD-10-CM

## 2016-10-04 DIAGNOSIS — R0789 Other chest pain: Secondary | ICD-10-CM

## 2016-10-04 DIAGNOSIS — R002 Palpitations: Secondary | ICD-10-CM

## 2016-10-04 LAB — COMPREHENSIVE METABOLIC PANEL
ALT: 23 U/L (ref 0–35)
AST: 18 U/L (ref 0–37)
Albumin: 4.2 g/dL (ref 3.5–5.2)
Alkaline Phosphatase: 74 U/L (ref 39–117)
BUN: 10 mg/dL (ref 6–23)
CALCIUM: 9.4 mg/dL (ref 8.4–10.5)
CHLORIDE: 106 meq/L (ref 96–112)
CO2: 24 mEq/L (ref 19–32)
CREATININE: 0.78 mg/dL (ref 0.40–1.20)
GFR: 85.86 mL/min (ref >60.00)
Glucose, Bld: 95 mg/dL (ref 70–99)
Potassium: 4.1 mEq/L (ref 3.5–5.1)
SODIUM: 137 meq/L (ref 135–145)
Total Bilirubin: 1.3 mg/dL — ABNORMAL HIGH (ref 0.2–1.2)
Total Protein: 7.7 g/dL (ref 6.0–8.3)

## 2016-10-04 LAB — CBC
HCT: 41.2 % (ref 36.0–46.0)
Hemoglobin: 14.2 g/dL (ref 12.0–15.0)
MCHC: 34.6 g/dL (ref 30.0–36.0)
MCV: 90 fl (ref 78.0–100.0)
Platelets: 278 10*3/uL (ref 150.0–400.0)
RBC: 4.58 Mil/uL (ref 3.87–5.11)
RDW: 13.7 % (ref 11.5–15.5)
WBC: 9.4 10*3/uL (ref 4.0–10.5)

## 2016-10-04 LAB — VITAMIN D 25 HYDROXY (VIT D DEFICIENCY, FRACTURES): VITD: 18.31 ng/mL — ABNORMAL LOW (ref 30.00–100.00)

## 2016-10-04 LAB — H. PYLORI ANTIBODY, IGG: H Pylori IgG: NEGATIVE

## 2016-10-04 LAB — VITAMIN B12: VITAMIN B 12: 307 pg/mL (ref 211–911)

## 2016-10-04 LAB — TSH: TSH: 2.29 u[IU]/mL (ref 0.35–4.50)

## 2016-10-04 NOTE — Patient Instructions (Signed)
    Paresthesia Introduction Paresthesia is a burning or prickling feeling. This feeling can happen in any part of the body. It often happens in the hands, arms, legs, or feet. Usually, it is not painful. In most cases, the feeling goes away in a short time and is not a sign of a serious problem. Follow these instructions at home:  Avoid drinking alcohol.  Try massage or needle therapy (acupuncture) to help with your problems.  Keep all follow-up visits as told by your doctor. This is important. Contact a doctor if:  You keep on having episodes of paresthesia.  Your burning or prickling feeling gets worse when you walk.  You have pain or cramps.  You feel dizzy.  You have a rash. Get help right away if:  You feel weak.  You have trouble walking or moving.  You have problems speaking, understanding, or seeing.  You feel confused.  You cannot control when you pee (urinate) or poop (bowel movement).  You lose feeling (numbness) after an injury.  You pass out (faint). This information is not intended to replace advice given to you by your health care provider. Make sure you discuss any questions you have with your health care provider. Document Released: 08/12/2008 Document Revised: 02/05/2016 Document Reviewed: 08/26/2014  2017 Elsevier  

## 2016-10-04 NOTE — Progress Notes (Signed)
Subjective:    Patient ID: Lori Huber, female    DOB: 27-Jan-1976, 41 y.o.   MRN: [id number]  HPI  Pt presents to the clinic today with multiple different complaints:  1- She has been having frequent headaches. This is not really new for her but she reports the headaches have changed in location and severity. Her headaches are usually located in her forehead, but lately, they have been located in the back of her head. She describes the pain as tight and pulling. The pain radiates into her upper back and left shoulder. She also has numbness, and tingling in her left arm and left foot, but is not sure if this is related. She denies visual changes or dizziness, but has felt lightheaded at times. She denies any injury to the neck or shoulder. She has taken Ibuprofen with some relief.   2- She is also concerned about palpitations and chest tightness. This occurs intermittently.  It can occur when she is anxious, but reports it occurs other times as well. She can not identify a consistent trigger for it. She denies associated dizziness, shortness of breath or chest pain. She was seen in the ER for similar symptoms. ECG and labs were normal. She was never referred for stress test of echo. She was started on Zoloft for her anxiety but she reports all the symptoms she has now, started when she started the Zoloft. She subsequently came off the Zoloft but her symptoms have not subsided.  3- She reports her anxiety is much worse since she was started on the Zoloft and now is having all these complications. Her anxiety at this time is being triggered by her health issues. She is not taking any medication for this after the bad experience she has had. She is going to counseling, which she feels like is helping her work through some issues, but she is concerned because if her symptoms were anxiety related, they should be improved by now.   4- She is extremely nauseated after she eats .She reports she can  only eat very small portions now. She was vomiting but has not vomited x 1 week. She has persistent diarrhea, but not foul odor or blood in her stool. She has cut out caffeine (because she felt like that was making the palpitations worse). She thought it may be a lactose intolerance but it has not been consistent with dairy. She has no personal or family history of food allergies.   Review of Systems      No past medical history on file.  Current Outpatient Prescriptions  Medication Sig Dispense Refill  . ibuprofen (ADVIL,MOTRIN) 200 MG tablet Take 200 mg by mouth as needed.    Marland Kitchen levonorgestrel (MIRENA) 20 MCG/24HR IUD 1 each by Intrauterine route once.     No current facility-administered medications for this visit.     Allergies  Allergen Reactions  . Zoloft [Sertraline] Other (See Comments)    Chest pain, nausea, vomiting    Family History  Problem Relation Age of Onset  . Hypertension Father   . Hypertension Brother   . Hypertension Paternal Grandmother   . Stroke Paternal Grandmother   . Heart disease Paternal Grandmother   . Diabetes Paternal Grandmother   . Cancer Neg Hx     Social History   Social History  . Marital status: Married    Spouse name: N/A  . Number of children: N/A  . Years of education: N/A   Occupational History  .  Not on file.   Social History Main Topics  . Smoking status: Never Smoker  . Smokeless tobacco: Never Used  . Alcohol use No     Comment: occasional  . Drug use: No  . Sexual activity: Yes    Birth control/ protection: IUD   Other Topics Concern  . Not on file   Social History Narrative  . No narrative on file     Constitutional: Pt reports fatigue and headaches. Denies fever, malaise, or abrupt weight changes.  HEENT: Denies eye pain, eye redness, ear pain, ringing in the ears, wax buildup, runny nose, nasal congestion, bloody nose, or sore throat. Respiratory: Denies difficulty breathing, shortness of breath, cough or  sputum production.   Cardiovascular: Pt reports palpitations and chest tightness. Denies chest pain, or swelling in the hands or feet.  Gastrointestinal: Pt reports nausea, vomiting and diarrhea. Denies abdominal pain, bloating, constipation, or blood in the stool.  GU: Denies urgency, frequency, pain with urination, burning sensation, blood in urine, odor or discharge. Musculoskeletal: Pt reports neck and left shoulder pain. Denies decrease in range of motion, difficulty with gait, muscle pain or joint swelling.  Skin: Denies redness, rashes, lesions or ulcercations.  Neurological: Pt reports burning, numbness and tingling in her left hand and left foot. Denies dizziness, difficulty with memory, difficulty with speech or problems with balance and coordination.  Psych: Pt reports anxiety. Denies depression, SI/HI.  No other specific complaints in a complete review of systems (except as listed in HPI above).  Objective:   Physical Exam  BP 118/78   Pulse 80   Temp 98.2 F (36.8 C) (Oral)   Wt 246 lb (111.6 kg)   LMP 10/02/2016   SpO2 98%   BMI 38.53 kg/m  Wt Readings from Last 3 Encounters:  10/04/16 246 lb (111.6 kg)  08/30/16 252 lb (114.3 kg)  08/29/16 250 lb (113.4 kg)    General: Appears her stated age, obese in NAD. Skin: Warm, dry and intact. No rashes, lesions or ulcerations noted.  Neck:  Neck supple, trachea midline. No masses, lumps or thyromegaly present.  Cardiovascular: Normal rate and rhythm. S1,S2 noted.  No murmur, rubs or gallops noted. No JVD or BLE edema. Radial and pedal pulse 2+ bilaterally. Pulmonary/Chest: Normal effort and positive vesicular breath sounds. No respiratory distress. No wheezes, rales or ronchi noted.  Abdomen: Soft and tender in the epigastric and LLQ. Normal bowel sounds. No distention or masses noted. Liver, spleen and kidneys non palpable. Musculoskeletal: Normal internal and external rotation of the left shoulder. Normal flexion, extension  and rotation of the cervical spine. No bony tenderness noted over the spine. Some pain with palpation over the trapezius. Strength 5/5 BUE/BLE. Negative Phalen's Negative Tinel's. Neurological: Alert and oriented.  Psychiatric: She is anxious appearing today.  BMET    Component Value Date/Time   NA 137 08/29/2016 0410   K 3.7 08/29/2016 0410   CL 107 08/29/2016 0410   CO2 21 (L) 08/29/2016 0410   GLUCOSE 111 (H) 08/29/2016 0410   BUN 13 08/29/2016 0410   CREATININE 0.74 08/29/2016 0410   CALCIUM 9.0 08/29/2016 0410   GFRNONAA >60 08/29/2016 0410   GFRAA >60 08/29/2016 0410    Lipid Panel     Component Value Date/Time   CHOL 172 11/26/2013 1421   TRIG 118.0 11/26/2013 1421   HDL 39.80 11/26/2013 1421   CHOLHDL 4 11/26/2013 1421   VLDL 23.6 11/26/2013 1421   LDLCALC 109 (H) 11/26/2013 1421  CBC    Component Value Date/Time   WBC 10.4 08/29/2016 0410   RBC 4.42 08/29/2016 0410   HGB 13.8 08/29/2016 0410   HCT 39.5 08/29/2016 0410   PLT 299 08/29/2016 0410   MCV 89.3 08/29/2016 0410   MCH 31.3 08/29/2016 0410   MCHC 35.1 08/29/2016 0410   RDW 13.0 08/29/2016 0410   LYMPHSABS 2.3 06/04/2015 1629   MONOABS 0.8 06/04/2015 1629   EOSABS 0.2 06/04/2015 1629   BASOSABS 0.0 06/04/2015 1629    Hgb A1C Lab Results  Component Value Date   HGBA1C 5.5 11/26/2013            Assessment & Plan:   Fatigue, tension headaches, paresthesia of left upper and left lower extremity, palpitations, chest tightness, nausea, vomiting, diarrhea and anxiety:  Will perform a thorough lab workup CBC, CMET, TSH, A1C, VIT B12, Vit D, H Pylori, Celiac panel, FSH and LH If labs normal, will refer to cardiology for futrher evaluation of palpitations and chest tightness If labs normal, will refer to GI for nausea, vomiting and diarrhea She is not willing to try any new medications at this time She refuses xray of cervical spine Encouraged her to continue counseling for anxiety as I  feel like this is the major contributing factor.  Will follow up after labs Nicki ReaperBAITY, Caryssa Elzey, NP

## 2016-10-05 ENCOUNTER — Encounter: Payer: Self-pay | Admitting: Internal Medicine

## 2016-10-05 ENCOUNTER — Other Ambulatory Visit (INDEPENDENT_AMBULATORY_CARE_PROVIDER_SITE_OTHER): Payer: BLUE CROSS/BLUE SHIELD

## 2016-10-05 DIAGNOSIS — R739 Hyperglycemia, unspecified: Secondary | ICD-10-CM

## 2016-10-05 LAB — FOLLICLE STIMULATING HORMONE: FSH: 5.4 m[IU]/mL

## 2016-10-05 LAB — HEMOGLOBIN A1C: Hgb A1c MFr Bld: 5.3 % (ref 4.6–6.5)

## 2016-10-05 LAB — LUTEINIZING HORMONE: LH: 3.6 m[IU]/mL

## 2016-10-05 NOTE — Addendum Note (Signed)
Addended by: Alvina ChouWALSH, TERRI J on: 10/05/2016 09:35 AM   Modules accepted: Orders

## 2016-10-06 ENCOUNTER — Encounter: Payer: Self-pay | Admitting: Internal Medicine

## 2016-10-07 ENCOUNTER — Other Ambulatory Visit: Payer: Self-pay | Admitting: Internal Medicine

## 2016-10-07 ENCOUNTER — Encounter: Payer: Self-pay | Admitting: Internal Medicine

## 2016-10-07 DIAGNOSIS — R0789 Other chest pain: Secondary | ICD-10-CM

## 2016-10-07 DIAGNOSIS — E559 Vitamin D deficiency, unspecified: Secondary | ICD-10-CM

## 2016-10-07 DIAGNOSIS — R202 Paresthesia of skin: Secondary | ICD-10-CM

## 2016-10-07 DIAGNOSIS — R002 Palpitations: Secondary | ICD-10-CM

## 2016-10-07 MED ORDER — VITAMIN D (ERGOCALCIFEROL) 1.25 MG (50000 UNIT) PO CAPS: 50000.0000 [IU] | ORAL_CAPSULE | ORAL | 0 refills | Status: DC

## 2016-10-08 ENCOUNTER — Other Ambulatory Visit: Payer: BLUE CROSS/BLUE SHIELD

## 2016-10-08 ENCOUNTER — Ambulatory Visit (INDEPENDENT_AMBULATORY_CARE_PROVIDER_SITE_OTHER)
Admission: RE | Admit: 2016-10-08 | Discharge: 2016-10-08 | Disposition: A | Discharge status: Home / Self Care | Payer: BLUE CROSS/BLUE SHIELD | Source: Ambulatory Visit | Attending: Internal Medicine | Admitting: Internal Medicine

## 2016-10-08 DIAGNOSIS — R202 Paresthesia of skin: Secondary | ICD-10-CM | POA: Diagnosis not present

## 2016-10-08 DIAGNOSIS — H6123 Impacted cerumen, bilateral: Secondary | ICD-10-CM | POA: Diagnosis not present

## 2016-10-08 DIAGNOSIS — M47812 Spondylosis without myelopathy or radiculopathy, cervical region: Secondary | ICD-10-CM | POA: Diagnosis not present

## 2016-10-11 ENCOUNTER — Encounter: Payer: Self-pay | Admitting: Internal Medicine

## 2016-10-11 ENCOUNTER — Telehealth: Payer: Self-pay | Admitting: Radiology

## 2016-10-11 ENCOUNTER — Other Ambulatory Visit: Payer: Self-pay | Admitting: Internal Medicine

## 2016-10-11 DIAGNOSIS — M5412 Radiculopathy, cervical region: Secondary | ICD-10-CM

## 2016-10-11 NOTE — Telephone Encounter (Signed)
Can you call and ask her to come back in. How do we know when codes are changed besides when we order the test and they don't run it?

## 2016-10-11 NOTE — Telephone Encounter (Signed)
I called for the Celiac panel results, they have changed the order code. This test wasn't done because the wrong code was used. Patient needs to come in for a redraw.

## 2016-10-12 ENCOUNTER — Telehealth: Payer: Self-pay | Admitting: Radiology

## 2016-10-12 DIAGNOSIS — R112 Nausea with vomiting, unspecified: Secondary | ICD-10-CM

## 2016-10-12 NOTE — Telephone Encounter (Signed)
LMOM for pt to come in for a redraw for the Celiac Panel, orders placed

## 2016-10-13 ENCOUNTER — Other Ambulatory Visit (INDEPENDENT_AMBULATORY_CARE_PROVIDER_SITE_OTHER): Payer: BLUE CROSS/BLUE SHIELD

## 2016-10-13 DIAGNOSIS — R112 Nausea with vomiting, unspecified: Secondary | ICD-10-CM

## 2016-10-15 DIAGNOSIS — E782 Mixed hyperlipidemia: Secondary | ICD-10-CM | POA: Diagnosis not present

## 2016-10-15 DIAGNOSIS — I208 Other forms of angina pectoris: Secondary | ICD-10-CM | POA: Diagnosis not present

## 2016-10-15 DIAGNOSIS — R002 Palpitations: Secondary | ICD-10-CM | POA: Diagnosis not present

## 2016-10-18 DIAGNOSIS — I208 Other forms of angina pectoris: Secondary | ICD-10-CM | POA: Diagnosis not present

## 2016-10-19 ENCOUNTER — Encounter: Payer: Self-pay | Admitting: Internal Medicine

## 2016-10-19 LAB — CELIAC PNL 2 RFLX ENDOMYSIAL AB TTR
(tTG) Ab, IgA: 1 U/mL
(tTG) Ab, IgG: 1 U/mL
Endomysial Ab IgA: NEGATIVE
GLIADIN(DEAM) AB,IGG: 3 U (ref <20)
Gliadin(Deam) Ab,IgA: 8 U (ref <20)
Immunoglobulin A: 251 mg/dL (ref 81–463)

## 2016-10-22 DIAGNOSIS — R002 Palpitations: Secondary | ICD-10-CM | POA: Diagnosis not present

## 2016-10-27 DIAGNOSIS — I208 Other forms of angina pectoris: Secondary | ICD-10-CM | POA: Diagnosis not present

## 2016-10-27 DIAGNOSIS — E782 Mixed hyperlipidemia: Secondary | ICD-10-CM | POA: Diagnosis not present

## 2016-10-27 DIAGNOSIS — R079 Chest pain, unspecified: Secondary | ICD-10-CM | POA: Diagnosis not present

## 2016-10-27 DIAGNOSIS — R002 Palpitations: Secondary | ICD-10-CM | POA: Diagnosis not present

## 2016-10-29 ENCOUNTER — Ambulatory Visit (INDEPENDENT_AMBULATORY_CARE_PROVIDER_SITE_OTHER): Payer: BLUE CROSS/BLUE SHIELD | Admitting: Obstetrics and Gynecology

## 2016-10-29 ENCOUNTER — Encounter: Payer: Self-pay | Admitting: Obstetrics and Gynecology

## 2016-10-29 VITALS — BP 127/81 | HR 77 | Resp 18 | Ht 67.0 in | Wt 245.0 lb

## 2016-10-29 DIAGNOSIS — Z01419 Encounter for gynecological examination (general) (routine) without abnormal findings: Secondary | ICD-10-CM | POA: Diagnosis not present

## 2016-10-29 DIAGNOSIS — Z0000 Encounter for general adult medical examination without abnormal findings: Secondary | ICD-10-CM

## 2016-10-29 NOTE — Progress Notes (Signed)
Subjective:     Lori Huber is a 41 y.o. female with BMI 38 who is here for a comprehensive physical exam. The patient reports no problems. Patient is sexually active without complaints. She is using IUD for contraception. She reports light spotting around the time of her period. She denies urinary incontinence. She denies pelvic pain. She reports the presence of an intermittent pruritic vaginal discharge without odor.  No past medical history on file. Past Surgical History:  Procedure Laterality Date  . CHOLECYSTECTOMY  2011  . KIDNEY STONE SURGERY  1996  . KNEE SURGERY Left    Family History  Problem Relation Age of Onset  . Hypertension Father   . Hypertension Brother   . Hypertension Paternal Grandmother   . Stroke Paternal Grandmother   . Heart disease Paternal Grandmother   . Diabetes Paternal Grandmother   . Cancer Neg Hx    Social History   Social History  . Marital status: Married    Spouse name: N/A  . Number of children: N/A  . Years of education: N/A   Occupational History  . Not on file.   Social History Main Topics  . Smoking status: Never Smoker  . Smokeless tobacco: Never Used  . Alcohol use No     Comment: occasional  . Drug use: No  . Sexual activity: Yes    Birth control/ protection: IUD   Other Topics Concern  . Not on file   Social History Narrative  . No narrative on file   Health Maintenance  Topic Date Due  . TETANUS/TDAP  09/13/2004  . INFLUENZA VACCINE  07/21/2017 (Originally 04/13/2016)  . PAP SMEAR  10/28/2018  . HIV Screening  Completed       Review of Systems Pertinent items are noted in HPI.   Objective:      GENERAL: Well-developed, well-nourished female in no acute distress.  HEENT: Normocephalic, atraumatic. Sclerae anicteric.  NECK: Supple. Normal thyroid.  LUNGS: Clear to auscultation bilaterally.  HEART: Regular rate and rhythm. BREASTS: Symmetric in size. No palpable masses or lymphadenopathy, skin  changes, or nipple drainage. ABDOMEN: Soft, nontender, nondistended. No organomegaly. PELVIC: Normal external female genitalia. Vagina is pink and rugated.  Normal discharge. Normal appearing cervix. Uterus is normal in size. No adnexal mass or tenderness. EXTREMITIES: No cyanosis, clubbing, or edema, 2+ distal pulses.    Assessment:    Healthy female exam.      Plan:    Pap smear and wet prep collected Referral for mammogram provided  Patient encouraged to perform monthly self breast and vulva exam Patient will be contacted with any abnormal results RTC prn See After Visit Summary for Counseling Recommendations

## 2016-11-01 LAB — CYTOLOGY - PAP: Diagnosis: NEGATIVE

## 2016-11-01 LAB — CERVICOVAGINAL ANCILLARY ONLY
Bacterial vaginitis: NEGATIVE
CANDIDA VAGINITIS: NEGATIVE
Trichomonas: NEGATIVE

## 2016-11-02 ENCOUNTER — Encounter: Payer: Self-pay | Admitting: Internal Medicine

## 2016-11-10 ENCOUNTER — Ambulatory Visit: Payer: BLUE CROSS/BLUE SHIELD | Attending: Internal Medicine

## 2016-11-10 DIAGNOSIS — M5412 Radiculopathy, cervical region: Secondary | ICD-10-CM

## 2016-11-10 DIAGNOSIS — M542 Cervicalgia: Secondary | ICD-10-CM

## 2016-11-10 NOTE — Patient Instructions (Signed)
  Gave seated R and L cervical rotation 10x3 at pain free range, and self mobilization with movement to R cervical rotation with posterior pressure to L C2 TP (transverse process) with towel with 10x3 R cervical rotation to pain free range daily as part of her HEP. Pt demonstrated and verbalized understanding.

## 2016-11-10 NOTE — Therapy (Signed)
[place] [facility] [address], [place], [address] Phone: [phone]   Fax:  [phone]  Physical Therapy Evaluation  Patient Details  Name: Lori Huber MRN: [id number] Date of Birth: 1976-02-14 Referring Provider: Lorre Munroe, NP  Encounter Date: 11/10/2016      PT End of Session - 11/10/16 1351    Visit Number 1   Number of Visits 13   Date for PT Re-Evaluation 12/23/16   PT Start Time 1351   PT Stop Time 1517   PT Time Calculation (min) 86 min   Activity Tolerance Patient tolerated treatment well   Behavior During Therapy [facility] for tasks assessed/performed      Past Medical History:  Diagnosis Date  . Anxiety   . Vitamin D deficiency     Past Surgical History:  Procedure Laterality Date  . CHOLECYSTECTOMY  2011  . KIDNEY STONE SURGERY  1996  . KNEE SURGERY Left     There were no vitals filed for this visit.       Subjective Assessment - 11/10/16 1356    Subjective neck pain: 5/10 currently (pt sitting), 7/10 at worst for the past month (more of a constant, rather than a debilitating pain). 3/10 at best.     L UE pain:  2-3/10 L UE currently (tingling sensation and ache), 3/10 L UE parethesias at most for the past month   Pertinent History Cervical radiculopathy to her L arm to hand (including all fingers) with numbness. Has had some numbness in R UE but mostly on her L side.   Had her heart checked out in which the results were normal. Getting an echocardiogram on Monday.  Had headaches for years. Right before Christmas, pt had and allergic reaction to one her her medications resulting in posterior head, neck, and L UE symptoms. Symptoms never went away after the medication stopped. Had and x-ray which revealed a pinched nerve and a bulging disc in her neck.  No unexplained changes in weight, no night pain.      Patient Stated Goals I would like to not have headaches as often and have more  mobility with movements in her neck. Have less symptoms in her L arm.    Currently in Pain? Yes   Pain Score 5    Pain Location Neck  and L UE   Pain Orientation Posterior   Pain Descriptors / Indicators Aching;Tingling;Stabbing;Numbness;Pins and needles   Pain Type Chronic pain   Pain Radiating Towards L UE to her hand   Pain Onset More than a month ago   Pain Frequency Constant   Aggravating Factors  sitting at her desk for a long period of time, cervical rotation (L > R, limited L cervical rotation), cervical protraction, looking up, supine position   Pain Relieving Factors sitting up straight, wrapping a blanket around her neck.             [facility] PT Assessment - 11/10/16 1411      Assessment   Medical Diagnosis Cervical radiculitis   Referring Provider Lorre Munroe, NP   Onset Date/Surgical Date --  Right before Christmas 2017   Prior Therapy No known PT for current condition     Precautions   Precaution Comments Minimal anteriolisthesis about 1.5 mm vertebral body C4 on C5 per X-ray     Restrictions   Other Position/Activity Restrictions no known restrictions     Balance Screen   Has  the patient fallen in the past 6 months No   Has the patient had a decrease in activity level because of a fear of falling?  No   Is the patient reluctant to leave their home because of a fear of falling?  No     Home Environment   Additional Comments Patient lives in a 2 story home with her husband, one step to enter, no rails.  15 steps inside with L rail      Prior Function   Vocation Full time employment  professor   Vocation Requirements PLOF: less difficulty working at her desk, turning her head, looking up. Less difficulty tolerating supine position     Observation/Other Assessments   Observations No symptom change with cervical compression or distraction. Increased L cervical pain with L side bend and compression. L UE discomfort with over pressure to head in R cervical side  bend.  (-) alar ligament test, (-) Sharp purser test, (-) VBI     Posture/Postural Control   Posture Comments Bilaterally protracted shoulders and neck, movement crease around C6/C7 area     AROM   Overall AROM Comments L shoulder functional ER full, but causes R posterior upper cervical symptoms and increase L UE paresthesia (most reproduction of symtoms). Full functional AROM for bilateral shoulders.    Cervical Flexion 40  No pain. Posterior neck stretch   Cervical Extension 34  with entire posterior neck pain   Cervical - Right Side Bend 40   Cervical - Left Side Bend 29  with L posterior cervical pain   Cervical - Right Rotation 70  with entire posterior neck pain   Cervical - Left Rotation 60  with entire posterior neck pain     Strength   Right Shoulder Flexion 4/5   Right Shoulder ABduction 4/5   Left Shoulder Flexion 4/5   Left Shoulder ABduction 4/5   Right Elbow Flexion 4+/5   Right Elbow Extension 5/5   Left Elbow Flexion 4+/5   Left Elbow Extension 5/5   Right Wrist Flexion 4+/5   Right Wrist Extension 4+/5   Left Wrist Flexion 4+/5   Left Wrist Extension 4+/5     Palpation   Palpation comment bilateral UT tightness.  Decreased P to A to cervical transverse processes L > R throughout cervical spine. L cervical transverse processes more palpable compared to R.  Decreased P to A mobility to thoracic spine.        Objectives    Manual therapy  prone UPA to L C2 transverse process grade 3-   decreased neck pain to 3-4/10 in sitting.    Improved R cervical rotation to 70 degrees, decreased pain with L cervical rotation   There-ex  Directed patient with seated cervical rotation 10x each side pain free range  Reviewed and given as part her HEP 10x3 daily to maintain ROM gains. Pt demonstrated and verbalized understanding   seated L cervical side bend 6x5 seconds. Increased L UE symptoms  Seated R cervical side bend 10x 5 seconds with R UE assist to return  to neutral   Seated chin tucks 10x5 seconds. Posterior cervical headache  Self mobilization with movement using towel for P to A to L C2 TP (transverse process) 10x2 R cervical rotation. Decreased cervical headache  Reviewed and given as part of her HEP 10x3 pain free range daily. Pt demonstrated and verbalized understanding.    Improved exercise technique, movement at target joints, use of target muscles after mod  verbal, visual, tactile cues.                          PT Education - 11/10/16 1603    Education provided Yes   Education Details ther-ex, HEP   Person(s) Educated Patient   Methods Explanation;Demonstration;Tactile cues;Verbal cues   Comprehension Verbalized understanding;Returned demonstration             PT Long Term Goals - 11/10/16 1532      PT LONG TERM GOAL #1   Title Patient will have a decrease in neck pain to 3/10 or less at worst to promote ability to work at her computer, turn her head, tolerate positions such as supine.    Baseline 7/10 at worst (11/10/2016)   Time 6   Period Weeks   Status New     PT LONG TERM GOAL #2   Title Patient will report no L UE symptoms to promote increase use of L UE.    Baseline 3/10 at worst (11/10/2016)   Time 6   Period Weeks   Status New     PT LONG TERM GOAL #3   Title Pt will report decreased difficulty sitting in front of her desk working due to her neck and L UE symptoms to promote ability to perform work related tasks.    Baseline Sitting at her desk for a long period of time increases pain (11/10/2016)   Time 6   Period Weeks   Status New     PT LONG TERM GOAL #4   Title Patient will be able to turn her head to full range without complain of neck pain to promote ability to turn her head.    Baseline End range R and L cervical rotation increases pain (11/10/2016)   Time 6   Period Weeks   Status New               Plan - 11/10/16 1519    Clinical Impression Statement Patient is  a 41 year old female who came to physcial therapy secondary to neck pain and L UE symptoms. She demonstrates decreased cervical and thoracic spine mobility, reproduction of symptoms with bilateral cervical rotation, L cervical side bend, L shoulder functional ER; decreased posterior cervical pain and headache with manual therapy to promote mobility to C2; bilateral upper trap muscle tightness, and difficulty performing functional tasks such as working in front of her computer, turning her head, looking up, and tolerating positions such as being in supine in bed. Patient will benefit from skilled physical therapy services to address the aforementioned deficits.     Rehab Potential Fair   Clinical Impairments Affecting Rehab Potential pain   PT Frequency 2x / week   PT Duration 6 weeks   PT Treatment/Interventions Manual techniques;Therapeutic exercise;Therapeutic activities;Dry needling;Patient/family education;Neuromuscular re-education;Ultrasound;Traction;Iontophoresis 4mg /ml Dexamethasone;Electrical Stimulation;Aquatic Therapy   PT Next Visit Plan cervical and thoracic mobility, scapular strengthening, posture   Consulted and Agree with Plan of Care Patient      Patient will benefit from skilled therapeutic intervention in order to improve the following deficits and impairments:  Pain, Postural dysfunction, Hypomobility, Decreased range of motion  Visit Diagnosis: Cervicalgia - Plan: PT plan of care cert/re-cert  Radiculopathy, cervical region - Plan: PT plan of care cert/re-cert     Problem List Patient Active Problem List   Diagnosis Date Noted  . MIGRAINES, HX OF 10/23/2008   [name] PT, DPT   11/10/2016, 5:40 PM  [place] [facility] [address], [place], [address] Phone: [phone]   Fax:  [phone]  Name: Lori Huber MRN: [id number] Date of Birth: 27-Jan-1976

## 2016-11-15 DIAGNOSIS — I208 Other forms of angina pectoris: Secondary | ICD-10-CM | POA: Diagnosis not present

## 2016-11-17 ENCOUNTER — Ambulatory Visit: Payer: BLUE CROSS/BLUE SHIELD | Attending: Internal Medicine

## 2016-11-17 DIAGNOSIS — M542 Cervicalgia: Secondary | ICD-10-CM | POA: Insufficient documentation

## 2016-11-17 DIAGNOSIS — F4323 Adjustment disorder with mixed anxiety and depressed mood: Secondary | ICD-10-CM | POA: Diagnosis not present

## 2016-11-17 DIAGNOSIS — M5412 Radiculopathy, cervical region: Secondary | ICD-10-CM

## 2016-11-17 NOTE — Patient Instructions (Signed)
  Sitting, tilt your head to the right   Hold for 5 seconds.    Use your right hand to help your head back up to starting position.    Repeat 10 times.    Perform 3 sets per day.

## 2016-11-17 NOTE — Therapy (Signed)
[facility] [address] [place], [place], [address] Phone: [phone]   Fax:  [phone]  Physical Therapy Treatment  Patient Details  Name: Lori HalimStaci L Saltz Huber MRN: [id number] Date of Birth: 03/14/1976 Referring Provider: Lorre Munroeegina W Baity, NP  Encounter Date: 11/17/2016      PT End of Session - 11/17/16 1356    Visit Number 2   Number of Visits 13   Date for PT Re-Evaluation 12/23/16   PT Start Time 1356   PT Stop Time 1435   PT Time Calculation (min) 39 min   Activity Tolerance Patient tolerated treatment well   Behavior During Therapy [facility] for tasks assessed/performed      Past Medical History:  Diagnosis Date  . Anxiety   . Vitamin D deficiency     Past Surgical History:  Procedure Laterality Date  . CHOLECYSTECTOMY  2011  . KIDNEY STONE SURGERY  1996  . KNEE SURGERY Left     There were no vitals filed for this visit.      Subjective Assessment - 11/17/16 1356    Subjective Head/neck is 6-7/10 currently. 3/10 L UE pain (ranges between a 1-3/10). Worse today as it has been for the last week. Laying down on her back, propped up kind watching TV bothers her L UE.  L UE symptoms are a lot less.  Currently stressed with work and life today which may play a factor.  Pt states feeling tingling in her L leg when laying down on her back at times. Did some stretches which did not really help.    Pertinent History Cervical radiculopathy to her L arm to hand (including all fingers) with numbness. Has had some numbness in R UE but mostly on her L side.   Had her heart checked out in which the results were normal. Getting an echocardiogram on Monday.  Had headaches for years. Right before Christmas, pt had and allergic reaction to one her her medications resulting in posterior head, neck, and L UE symptoms. Symptoms never went away after the medication stopped. Had and x-ray which revealed a pinched nerve and a  bulging disc in her neck.  No unexplained changes in weight, no night pain.      Patient Stated Goals I would like to not have headaches as often and have more mobility with movements in her neck. Have less symptoms in her L arm.    Currently in Pain? Yes   Pain Score 7    Pain Onset More than a month ago                                 PT Education - 11/17/16 1747    Education provided Yes   Education Details ther-ex, HEP   Person(s) Educated Patient   Methods Explanation;Demonstration;Tactile cues;Verbal cues        Objectives    Manual therapy  Supine suboccipital release. Very slight decrease in symptoms  Supine UPA to R C2 TP grade 3- Supine UPA to L C3 TP grade 3-    Decreased neck pain and headache   There-ex  Supine chin tucks 10x5 seconds. Increased greater occipital nerve pressure, releases with rest   Seated chin tucks 10x5 seconds.   Seated R cervical side bend 10x 5 seconds for 2 sets with R UE assist to return to neutral   Decreased L UE symptoms  Reviewed and given as part of her HEP. Pt demonstrated and verbalized understanding.   Seated gentle cervical nod isometrics 10x5 seconds for 2 sets, resisting fist to help decrease suboccipital muscle activation.   Pt was recommended to hold off on exercises at home that bothers her neck/symptoms. Pt verbalized understanding.    Improved exercise technique, movement at target joints, use of target muscles after mod verbal, visual, tactile cues.     Decreased neck pain after manual therapy. Decreased L UE symptoms with R cervical side bending to increase intervertebral foraminal opening. Pt might benefit from scapular retraction exercises next visit to help decrease cervical extension pressure. Pt tolerated session well without aggravation of symptoms.           PT Long Term Goals - 11/10/16 1532      PT LONG TERM GOAL #1   Title Patient will have a decrease in neck pain to  3/10 or less at worst to promote ability to work at her computer, turn her head, tolerate positions such as supine.    Baseline 7/10 at worst (11/10/2016)   Time 6   Period Weeks   Status New     PT LONG TERM GOAL #2   Title Patient will report no L UE symptoms to promote increase use of L UE.    Baseline 3/10 at worst (11/10/2016)   Time 6   Period Weeks   Status New     PT LONG TERM GOAL #3   Title Pt will report decreased difficulty sitting in front of her desk working due to her neck and L UE symptoms to promote ability to perform work related tasks.    Baseline Sitting at her desk for a long period of time increases pain (11/10/2016)   Time 6   Period Weeks   Status New     PT LONG TERM GOAL #4   Title Patient will be able to turn her head to full range without complain of neck pain to promote ability to turn her head.    Baseline End range R and L cervical rotation increases pain (11/10/2016)   Time 6   Period Weeks   Status New               Plan - 11/17/16 1749    Clinical Impression Statement Decreased neck pain after manual therapy. Decreased L UE symptoms with R cervical side bending to increase intervertebral foraminal opening. Pt might benefit from scapular retraction exercises next visit to help decrease cervical extension pressure. Pt tolerated session well without aggravation of symptoms.    Rehab Potential Fair   Clinical Impairments Affecting Rehab Potential pain   PT Frequency 2x / week   PT Duration 6 weeks   PT Treatment/Interventions Manual techniques;Therapeutic exercise;Therapeutic activities;Dry needling;Patient/family education;Neuromuscular re-education;Ultrasound;Traction;Iontophoresis 4mg /ml Dexamethasone;Electrical Stimulation;Aquatic Therapy   PT Next Visit Plan cervical and thoracic mobility, scapular strengthening, posture   Consulted and Agree with Plan of Care Patient      Patient will benefit from skilled therapeutic intervention in  order to improve the following deficits and impairments:  Pain, Postural dysfunction, Hypomobility, Decreased range of motion  Visit Diagnosis: Cervicalgia  Radiculopathy, cervical region     Problem List Patient Active Problem List   Diagnosis Date Noted  . MIGRAINES, HX OF 10/23/2008    [name] PT, DPT  11/17/2016, 5:55 PM  [place] [facility] SPOR[facility]IC[facility] [address] [place], [address] Phone: [phone]  Fax:  [phone]  Name: Lori Huber MRN: [id number] Date of Birth: 10-05-1975

## 2016-11-24 DIAGNOSIS — F4323 Adjustment disorder with mixed anxiety and depressed mood: Secondary | ICD-10-CM | POA: Diagnosis not present

## 2016-11-25 ENCOUNTER — Ambulatory Visit: Payer: BLUE CROSS/BLUE SHIELD

## 2016-12-01 ENCOUNTER — Ambulatory Visit: Payer: BLUE CROSS/BLUE SHIELD

## 2016-12-01 DIAGNOSIS — M542 Cervicalgia: Secondary | ICD-10-CM | POA: Diagnosis not present

## 2016-12-01 DIAGNOSIS — M5412 Radiculopathy, cervical region: Secondary | ICD-10-CM | POA: Diagnosis not present

## 2016-12-01 NOTE — Therapy (Signed)
[place] [facility] [address], [place], [address] Phone: [phone]   Fax:  [phone]  Physical Therapy Treatment  Patient Details  Name: Lori Huber MRN: [id number] Date of Birth: October 23, 1975 Referring Provider: Lorre Munroe, NP  Encounter Date: 12/01/2016      PT End of Session - 12/01/16 1651    Visit Number 3   Number of Visits 13   Date for PT Re-Evaluation 12/23/16   PT Start Time 1651   PT Stop Time 1740   PT Time Calculation (min) 49 min   Activity Tolerance Patient tolerated treatment well   Behavior During Therapy [facility] for tasks assessed/performed      Past Medical History:  Diagnosis Date  . Anxiety   . Vitamin D deficiency     Past Surgical History:  Procedure Laterality Date  . CHOLECYSTECTOMY  2011  . KIDNEY STONE SURGERY  1996  . KNEE SURGERY Left     There were no vitals filed for this visit.      Subjective Assessment - 12/01/16 1652    Subjective Getting a massage this coming Sunday. Neck has not been too bad. Did some traveling in the weekend and was sore when she came back. Had a few good days when her neck was a 3/10.  5/10 neck (7/10 at most for the past 7 days when driving), 9/52 L UE currently (4/10 at most for the past 7 days, usually when stressed).  Currenly has posterior headache.  Pt states that she thinks the HEP is helping.    Pertinent History Cervical radiculopathy to her L arm to hand (including all fingers) with numbness. Has had some numbness in R UE but mostly on her L side.   Had her heart checked out in which the results were normal. Getting an echocardiogram on Monday.  Had headaches for years. Right before Christmas, pt had and allergic reaction to one her her medications resulting in posterior head, neck, and L UE symptoms. Symptoms never went away after the medication stopped. Had and x-ray which revealed a pinched nerve and a bulging disc in her  neck.  No unexplained changes in weight, no night pain.      Patient Stated Goals I would like to not have headaches as often and have more mobility with movements in her neck. Have less symptoms in her L arm.    Currently in Pain? Yes   Pain Score 5    Pain Onset More than a month ago                                 PT Education - 12/01/16 1701    Education provided Yes   Education Details ther-ex, HEP   Person(s) Educated Patient   Methods Explanation;Demonstration;Tactile cues;Verbal cues;Handout   Comprehension Verbalized understanding;Returned demonstration        Objectives    There-ex  Seated thoracic extension over chair 10x2 with 5 second holds.  Standing bilateral scapular retraction rows resisting yellow band 10x5 seconds for 2 sets  Seated manually resisted scapular retraction targeting the lower trap muscle  R side 10x5 seconds for 3 sets  L side 10x5 seconds for 3 sets  Decreased neck pain and headache    T-band Triceps extension yellow 10x each UE (per pt request)   Seated chin tuck isometrics with manual resistance from pt fist 10x2  with 5 seconds. Slight increase in posterior neck tightness, no increase in pain  Seated R cervical side bend 5x10 seconds. Slight increase in L thumb tingling today. Eases with rest.  Seated L cervical side 5x5 seconds   Sitting with bilateral scapular retraction and posterior tipping targeting the lower trap muscles. Slight L finger throbbing/blood rushing sensation but not tingling. Eases with rest.    Improved exercise technique, movement at target joints, use of target muscles after min to mod verbal, visual, tactile cues.     Decreased neck pain and headache with exercises to promote gentle thoracic extension, as well as lower trap strengthening to decrease pressure to neck and decrease tension to bilateral upper trap muscles.            PT Long Term Goals - 11/10/16 1532      PT  LONG TERM GOAL #1   Title Patient will have a decrease in neck pain to 3/10 or less at worst to promote ability to work at her computer, turn her head, tolerate positions such as supine.    Baseline 7/10 at worst (11/10/2016)   Time 6   Period Weeks   Status New     PT LONG TERM GOAL #2   Title Patient will report no L UE symptoms to promote increase use of L UE.    Baseline 3/10 at worst (11/10/2016)   Time 6   Period Weeks   Status New     PT LONG TERM GOAL #3   Title Pt will report decreased difficulty sitting in front of her desk working due to her neck and L UE symptoms to promote ability to perform work related tasks.    Baseline Sitting at her desk for a long period of time increases pain (11/10/2016)   Time 6   Period Weeks   Status New     PT LONG TERM GOAL #4   Title Patient will be able to turn her head to full range without complain of neck pain to promote ability to turn her head.    Baseline End range R and L cervical rotation increases pain (11/10/2016)   Time 6   Period Weeks   Status New               Plan - 12/01/16 1704    Clinical Impression Statement Decreased neck pain and headache with exercises to promote gentle thoracic extension, as well as lower trap strengthening to decrease pressure to neck and decrease tension to bilateral upper trap muscles.      Rehab Potential Fair   Clinical Impairments Affecting Rehab Potential pain   PT Frequency 2x / week   PT Duration 6 weeks   PT Treatment/Interventions Manual techniques;Therapeutic exercise;Therapeutic activities;Dry needling;Patient/family education;Neuromuscular re-education;Ultrasound;Traction;Iontophoresis 4mg /ml Dexamethasone;Electrical Stimulation;Aquatic Therapy   PT Next Visit Plan cervical and thoracic mobility, scapular strengthening, posture   Consulted and Agree with Plan of Care Patient      Patient will benefit from skilled therapeutic intervention in order to improve the following  deficits and impairments:  Pain, Postural dysfunction, Hypomobility, Decreased range of motion  Visit Diagnosis: Cervicalgia  Radiculopathy, cervical region     Problem List Patient Active Problem List   Diagnosis Date Noted  . MIGRAINES, HX OF 10/23/2008    Loralyn FreshwaterMiguel Analie Katzman PT, DPT   12/01/2016, 5:50 PM  [facility] [address] [place], [place], [address] Phone: [phone]   Fax:  [phone]  Name: Lori Huber MRN: [id number] Date of Birth: Apr 15, 1976

## 2016-12-01 NOTE — Patient Instructions (Addendum)
(  Home) Extension: Thoracic With Lumbar Lock - Sitting    Keep neck in neutral position.   Sit with back against chair, knees bent, hands folded across (not shown). Extend trunk over chair back. Hold position for __5__ seconds. Repeat ___10  times per set. Do _3___ sets per session daily.   Copyright  VHI. All rights reserved.       Scapular Retraction: Rowing (Eccentric) - Arms - 45 Degrees (Resistance Band)   Hold end of band in each hand. Pull back until elbows are even with trunk.   Hold for 5 seconds.  Use ______yellow__ resistance band. _10__ reps per set, _3__ sets per day. Copyright  VHI. All rights reserved.

## 2016-12-02 DIAGNOSIS — F4323 Adjustment disorder with mixed anxiety and depressed mood: Secondary | ICD-10-CM | POA: Diagnosis not present

## 2016-12-03 ENCOUNTER — Ambulatory Visit
Admission: RE | Admit: 2016-12-03 | Discharge: 2016-12-03 | Disposition: A | Discharge status: Home / Self Care | Payer: BLUE CROSS/BLUE SHIELD | Source: Ambulatory Visit | Attending: Obstetrics and Gynecology | Admitting: Obstetrics and Gynecology

## 2016-12-03 DIAGNOSIS — Z01419 Encounter for gynecological examination (general) (routine) without abnormal findings: Secondary | ICD-10-CM

## 2016-12-03 DIAGNOSIS — Z1231 Encounter for screening mammogram for malignant neoplasm of breast: Secondary | ICD-10-CM | POA: Insufficient documentation

## 2016-12-08 ENCOUNTER — Ambulatory Visit: Payer: BLUE CROSS/BLUE SHIELD

## 2016-12-08 DIAGNOSIS — F4323 Adjustment disorder with mixed anxiety and depressed mood: Secondary | ICD-10-CM | POA: Diagnosis not present

## 2016-12-08 DIAGNOSIS — M5412 Radiculopathy, cervical region: Secondary | ICD-10-CM | POA: Diagnosis not present

## 2016-12-08 DIAGNOSIS — M542 Cervicalgia: Secondary | ICD-10-CM

## 2016-12-08 NOTE — Therapy (Signed)
[place] [facility] PHYS[facility]ORTS MEDICINE [address], [place], [address] Phone: [phone]   Fax:  [phone]  Physical Therapy Treatment  Patient Details  Name: Jalena Vanderlinden MRN: [id number] Date of Birth: [birth year] Referring Provider: Lorre Munroe, NP  Encounter Date: 12/08/2016      PT End of Session - 12/08/16 0812    Visit Number 4   Number of Visits 13   Date for PT Re-Evaluation 12/23/16   PT Start Time 0814   PT Stop Time 0859   PT Time Calculation (min) 45 min   Activity Tolerance Patient tolerated treatment well   Behavior During Therapy [facility] for tasks assessed/performed      Past Medical History:  Diagnosis Date  . Anxiety   . Vitamin D deficiency     Past Surgical History:  Procedure Laterality Date  . CHOLECYSTECTOMY  2011  . KIDNEY STONE SURGERY  1996  . KNEE SURGERY Left     There were no vitals filed for this visit.      Subjective Assessment - 12/08/16 0815    Subjective Neck feels pretty good today. 2/10 neck and L arm pain. Only changes when she is stressed (pain increases).  Neck is definitely improving.  Feels pain going down her L arm to fingers (to dorsal hand, predominately digits 3-5; symptoms predominately along C7,C8 dermatome).   Pertinent History Cervical radiculopathy to her L arm to hand (including all fingers) with numbness. Has had some numbness in R UE but mostly on her L side.   Had her heart checked out in which the results were normal. Getting an echocardiogram on Monday.  Had headaches for years. Right before Christmas, pt had and allergic reaction to one her her medications resulting in posterior head, neck, and L UE symptoms. Symptoms never went away after the medication stopped. Had and x-ray which revealed a pinched nerve and a bulging disc in her neck.  No unexplained changes in weight, no night pain.      Patient Stated Goals I would like to not have headaches as often and  have more mobility with movements in her neck. Have less symptoms in her L arm.    Currently in Pain? Yes   Pain Score 2    Pain Onset More than a month ago                                 PT Education - 12/08/16 6962    Education provided Yes   Education Details ther-ex   [employer]) Educated Patient   Methods Explanation;Demonstration;Tactile cues;Verbal cues   Comprehension Verbalized understanding;Returned demonstration        Objectives    There-ex  Supine open books 10x3 with 5 second holds to promote thoracic extension  Supine cervical nodding 1 min  Then with pressing tongue at roof of her mouth 1 min Supine cervical rotation 2 x 2 min with pressing tongue at roof of mouth   Seated manually resisted scapular retraction targeting the lower trap muscle             R side 10x5 seconds for 3 sets             L side 10x5 seconds for 3 sets  Standing bilateral shoulder extension with scapular retraction resisting yellow band 10x3 with 5 second holds   Improved exercise technique, movement at target joints,  use of target muscles after min to mod verbal, visual, tactile cues.     No pain with cervical rotation bilaterally today, just tightness. Decreased neck and L UE symptoms after session. Continue working on gentle thoracic extension, gentle cervical rotation, increasing use of lower and middle trap muscles, decreasing use of upper trap muscles.        PT Long Term Goals - 11/10/16 1532      PT LONG TERM GOAL #1   Title Patient will have a decrease in neck pain to 3/10 or less at worst to promote ability to work at her computer, turn her head, tolerate positions such as supine.    Baseline 7/10 at worst (11/10/2016)   Time 6   Period Weeks   Status New     PT LONG TERM GOAL #2   Title Patient will report no L UE symptoms to promote increase use of L UE.    Baseline 3/10 at worst (11/10/2016)   Time 6   Period Weeks   Status  New     PT LONG TERM GOAL #3   Title Pt will report decreased difficulty sitting in front of her desk working due to her neck and L UE symptoms to promote ability to perform work related tasks.    Baseline Sitting at her desk for a long period of time increases pain (11/10/2016)   Time 6   Period Weeks   Status New     PT LONG TERM GOAL #4   Title Patient will be able to turn her head to full range without complain of neck pain to promote ability to turn her head.    Baseline End range R and L cervical rotation increases pain (11/10/2016)   Time 6   Period Weeks   Status New               Plan - 12/08/16 16100811    Clinical Impression Statement No pain with cervical rotation bilaterally today, just tightness. Decreased neck and L UE symptoms after session. Continue working on gentle thoracic extension, gentle cervical rotation, increasing use of lower and middle trap muscles, decreasing use of upper trap muscles.    Rehab Potential Fair   Clinical Impairments Affecting Rehab Potential pain   PT Frequency 2x / week   PT Duration 6 weeks   PT Treatment/Interventions Manual techniques;Therapeutic exercise;Therapeutic activities;Dry needling;Patient/family education;Neuromuscular re-education;Ultrasound;Traction;Iontophoresis 4mg /ml Dexamethasone;Electrical Stimulation;Aquatic Therapy   PT Next Visit Plan cervical and thoracic mobility, scapular strengthening, posture   Consulted and Agree with Plan of Care Patient      Patient will benefit from skilled therapeutic intervention in order to improve the following deficits and impairments:  Pain, Postural dysfunction, Hypomobility, Decreased range of motion  Visit Diagnosis: Cervicalgia  Radiculopathy, cervical region     Problem List Patient Active Problem List   Diagnosis Date Noted  . MIGRAINES, HX OF 10/23/2008   Loralyn FreshwaterMiguel Tomeeka Plaugher PT, DPT   12/08/2016, 9:10 AM  [place] [facility] [address] [place], [place], [address] Phone: [phone]   Fax:  [phone]  Name: Hassell HalimStaci L Saltz Spieker MRN: [id number] Date of Birth: 06/16/1976

## 2016-12-08 NOTE — Patient Instructions (Addendum)
Sitting, tilt your head to the right              Hold for 5 seconds.               Use your right hand to help your head back up to starting position.               Repeat 10 times.               Perform 3 sets per day.        (Home) Extension: Thoracic With Lumbar Lock - Sitting    Keep neck in neutral position.   Sit with back against chair, knees bent, hands folded across (not shown). Extend trunk over chair back. Hold position for __5__ seconds. Repeat ___10  times per set. Do _3___ sets per session daily.   Copyright  VHI. All rights reserved.       Scapular Retraction: Rowing (Eccentric) - Arms - 45 Degrees (Resistance Band)   Hold end of band in each hand. Pull back until elbows are even with trunk.   Hold for 5 seconds.  Use ______yellow__ resistance band. _10__ reps per set, _3__ sets per day. Copyright  VHI. All rights reserved.       AROM: Neck Rotation      Lying on your back, head and neck supported and pressing your tongue at the roof of your mouth:  Turn head slowly from one side to the other in a pain free range.   Perform for 1-2 min.   Repeat _1___ times per set.  Do _1___ sessions per day.  http://orth.exer.us/295   Copyright  VHI. All rights reserved.

## 2016-12-15 ENCOUNTER — Ambulatory Visit: Payer: BLUE CROSS/BLUE SHIELD | Attending: Internal Medicine

## 2016-12-15 DIAGNOSIS — M542 Cervicalgia: Secondary | ICD-10-CM | POA: Insufficient documentation

## 2016-12-15 DIAGNOSIS — M5412 Radiculopathy, cervical region: Secondary | ICD-10-CM

## 2016-12-15 DIAGNOSIS — F4323 Adjustment disorder with mixed anxiety and depressed mood: Secondary | ICD-10-CM | POA: Diagnosis not present

## 2016-12-15 NOTE — Patient Instructions (Signed)
Pt was recommended to use a heating pad behind her head (around the suboccipital muscles ) in supine x 15 min at a time followed by 3x10 supine cervical nodding with tongue pressed at roof of her mouth. Pt demonstrated and verbalized understanding.

## 2016-12-15 NOTE — Therapy (Signed)
[place] [facility]TS MEDICINE [address] [place], [address] Phone: [phone]   Fax:  [phone]  Physical Therapy Treatment  Patient Details  Name: Lori Huber MRN: [id number] Date of Birth: 1975/11/05 Referring Provider: Lorre Munroe, NP  Encounter Date: 12/15/2016      PT End of Session - 12/15/16 1534    Visit Number 5   Number of Visits 13   Date for PT Re-Evaluation 12/23/16   PT Start Time 1534   PT Stop Time 1620   PT Time Calculation (min) 46 min   Activity Tolerance Patient tolerated treatment well   Behavior During Therapy [facility] for tasks assessed/performed      Past Medical History:  Diagnosis Date  . Anxiety   . Vitamin D deficiency     Past Surgical History:  Procedure Laterality Date  . CHOLECYSTECTOMY  2011  . KIDNEY STONE SURGERY  1996  . KNEE SURGERY Left     There were no vitals filed for this visit.      Subjective Assessment - 12/15/16 1535    Subjective Neck is a little sore today (4-5/10 currently ). L arm was really sore last night (5/10) but currently back down to a 2/10.  Had a 7/10 headache pain yesterday. Feels soreness at the base of her skull and down to the L side of her neck today.    Pertinent History Cervical radiculopathy to her L arm to hand (including all fingers) with numbness. Has had some numbness in R UE but mostly on her L side.   Had her heart checked out in which the results were normal. Getting an echocardiogram on Monday.  Had headaches for years. Right before Christmas, pt had and allergic reaction to one her her medications resulting in posterior head, neck, and L UE symptoms. Symptoms never went away after the medication stopped. Had and x-ray which revealed a pinched nerve and a bulging disc in her neck.  No unexplained changes in weight, no night pain.      Patient Stated Goals I would like to not have headaches as often and have more mobility with  movements in her neck. Have less symptoms in her L arm.    Currently in Pain? Yes   Pain Score 5    Pain Onset More than a month ago                                 PT Education - 12/15/16 1627    Education provided Yes   Education Details ther-ex, HEP   Person(s) Educated Patient   Methods Explanation;Demonstration;Tactile cues;Verbal cues   Comprehension Returned demonstration;Verbalized understanding        Objectives   Manual therapy  STM to L upper trap/scalene muscle area  Decreased neck tension STM to R upper trap/scalene muscle area  Decreased neck tension STM to suboccipital muscles in supine. Decreased headache to a 3/10    There-ex   Seated manually resisted scapular retraction targeting the lower trap muscle R side 10x5 seconds for 3 sets L side 10x5 seconds for 3 sets  Supine cervical nodding with tongue pressed at roof of mouth 10x3   Improved exercise technique, movement at target joints, use of target muscles after min to mod verbal, visual, tactile cues.    Decreased neck tightness with manual therapy and exercises to decrease bilateral upper trap muscle  tension and increasing lower and middle trap muscle use. Decreased posterior cervical headache following manual therapy to decrease suboccipital muscle tension. Pt states feeling better after session.         PT Long Term Goals - 11/10/16 1532      PT LONG TERM GOAL #1   Title Patient will have a decrease in neck pain to 3/10 or less at worst to promote ability to work at her computer, turn her head, tolerate positions such as supine.    Baseline 7/10 at worst (11/10/2016)   Time 6   Period Weeks   Status New     PT LONG TERM GOAL #2   Title Patient will report no L UE symptoms to promote increase use of L UE.    Baseline 3/10 at worst (11/10/2016)   Time 6   Period Weeks   Status New     PT LONG TERM GOAL #3   Title Pt will report  decreased difficulty sitting in front of her desk working due to her neck and L UE symptoms to promote ability to perform work related tasks.    Baseline Sitting at her desk for a long period of time increases pain (11/10/2016)   Time 6   Period Weeks   Status New     PT LONG TERM GOAL #4   Title Patient will be able to turn her head to full range without complain of neck pain to promote ability to turn her head.    Baseline End range R and L cervical rotation increases pain (11/10/2016)   Time 6   Period Weeks   Status New               Plan - 12/15/16 1627    Clinical Impression Statement Decreased neck tightness with manual therapy and exercises to decrease bilateral upper trap muscle tension and increasing lower and middle trap muscle use. Decreased posterior cervical headache following manual therapy to decrease suboccipital muscle tension. Pt states feeling better after session.    Rehab Potential Fair   Clinical Impairments Affecting Rehab Potential pain   PT Frequency 2x / week   PT Duration 6 weeks   PT Treatment/Interventions Manual techniques;Therapeutic exercise;Therapeutic activities;Dry needling;Patient/family education;Neuromuscular re-education;Ultrasound;Traction;Iontophoresis /ml Dexamethasone;Electrical Stimulation;Aquatic Therapy   PT Next Visit Plan cervical and thoracic mobility, scapular strengthening, posture   Consulted and Agree with Plan of Care Patient      Patient will benefit from skilled therapeutic intervention in order to improve the following deficits and impairments:  Pain, Postural dysfunction, Hypomobility, Decreased range of motion  Visit Diagnosis: Cervicalgia  Radiculopathy, cervical region     Problem List Patient Active Problem List   Diagnosis Date Noted  . MIGRAINES, HX OF 10/23/2008    [name] PT, DPT   12/15/2016, 4:30 PM  [place] [facility] PHYSICAL AND SPORTS MEDICINE [address], [place], [address] Phone: [phone]   Fax:  [phone]  Name: Lori Huber MRN: [id number] Date of Birth: 10/22/1975

## 2016-12-22 ENCOUNTER — Ambulatory Visit: Payer: BLUE CROSS/BLUE SHIELD

## 2016-12-22 DIAGNOSIS — M5412 Radiculopathy, cervical region: Secondary | ICD-10-CM

## 2016-12-22 DIAGNOSIS — F4323 Adjustment disorder with mixed anxiety and depressed mood: Secondary | ICD-10-CM | POA: Diagnosis not present

## 2016-12-22 DIAGNOSIS — M542 Cervicalgia: Secondary | ICD-10-CM | POA: Diagnosis not present

## 2016-12-22 NOTE — Therapy (Signed)
[place] [facility] SPORTS MEDICINE [address] [place], [address] Phone: [phone]   Fax:  [phone]  Physical Therapy Treatment  Patient Details  Name: Lori Huber MRN: [id number] Date of Birth: 12/31/1975 Referring Provider: Jearld Fenton, NP  Encounter Date: 12/22/2016      PT End of Session - 12/22/16 1536    Visit Number 6   Number of Visits 13   Date for PT Re-Evaluation 12/23/16   PT Start Time 1537   PT Stop Time 1623   PT Time Calculation (min) 46 min   Activity Tolerance Patient tolerated treatment well   Behavior During Therapy [facility] for tasks assessed/performed      Past Medical History:  Diagnosis Date  . Anxiety   . Vitamin D deficiency     Past Surgical History:  Procedure Laterality Date  . CHOLECYSTECTOMY  2011  . [place]  . KNEE SURGERY Left     There were no vitals filed for this visit.      Subjective Assessment - 12/22/16 1538    Subjective Neck is about a 3/10. It's stiff but its not sore (5/10 at most for the past 7 days). Has noticed that stress increases her pain. Feels better than it was since starting PT.  Wants to take a few weeks doing her HEP and see how she does because the exercises help. Had a stressful day yesterday which bothered her neck but felt better when she calmed down. Uses the heating pad for several night as well. L arm is ok, 1-2/10 currently give or take. Might be due to stress as well. 5/10 L UE pain at worst for the past 7 days (during a very stressful time period). The headaches are not as frequent.      Pertinent History Cervical radiculopathy to her L arm to hand (including all fingers) with numbness. Has had some numbness in R UE but mostly on her L side.   Had her heart checked out in which the results were normal. Getting an echocardiogram on Monday.  Had headaches for years. Right before Christmas, pt had and allergic reaction to one  her her medications resulting in posterior head, neck, and L UE symptoms. Symptoms never went away after the medication stopped. Had and x-ray which revealed a pinched nerve and a bulging disc in her neck.  No unexplained changes in weight, no night pain.      Patient Stated Goals I would like to not have headaches as often and have more mobility with movements in her neck. Have less symptoms in her L arm.    Currently in Pain? Yes   Pain Score 3    Pain Onset More than a month ago                                 PT Education - 12/22/16 1649    Education provided Yes   Education Details ther-ex, plan of care   Person(s) Educated Patient   Methods Explanation;Demonstration;Tactile cues;Verbal cues   Comprehension Verbalized understanding;Returned demonstration        Objectives  Patient states that her pain is not as frequent compared to before starting PT.     There-ex   Reviewed progress/current status towards PT goals. R cervical rotation x 1. Full, no pain L cervical rotation x 1.  WFL, stiff, no pain  Reviewed plan of care: HEP x 4 weeks with a follow up appointment to see how she does.   Seated manually resisted scapular retraction targeting the lower trap muscle R side 10x5 seconds for 3 sets L side 10x5 seconds for 3 sets   Decreased neck stiffness with L cervical rotation afterwards.    Improved exercise technique, movement at target joints, use of target muscles after min to mod verbal, visual, tactile cues.      Manual therapy  STM to L upper trap muscle area        STM to R upper trap muscle area             Decreased neck tension  STM R posterior cervical paraspinal muscles    Pt demonstrates overall decreased neck pain, decreased frequency of symptoms, decreased difficulty sitting in front of her desk to perform work related tasks and decreased symptoms with cervical rotation. Patient making  very good progress with PT towards goals. Per pt reports, stress also plays a factor with increased neck and L UE symptoms but the exercises seem to help her. Pt to continue performing her HEP x 4 weeks with a follow up appointment to see how she does with them due to progress and patient preference.          PT Long Term Goals - 12/22/16 1545      PT LONG TERM GOAL #1   Title Patient will have a decrease in neck pain to 3/10 or less at worst to promote ability to work at her computer, turn her head, tolerate positions such as supine.    Baseline 7/10 at worst (11/10/2016); 5/10 (12/22/2016)   Time 6   Period Weeks   Status Partially Met     PT LONG TERM GOAL #2   Title Patient will report no L UE symptoms to promote increase use of L UE.    Baseline 3/10 at worst (11/10/2016); 5/10 for a short period of time during stress (12/22/2016)   Time 6   Period Weeks   Status On-going     PT LONG TERM GOAL #3   Title Pt will report decreased difficulty sitting in front of her desk working due to her neck and L UE symptoms to promote ability to perform work related tasks.    Baseline Sitting at her desk for a long period of time increases pain (11/10/2016); Pt states less difficulty (12/22/2016)   Time 6   Period Weeks   Status Achieved     PT LONG TERM GOAL #4   Title Patient will be able to turn her head to full range without complain of neck pain to promote ability to turn her head.    Baseline End range R and L cervical rotation increases pain (11/10/2016); WFL to full without pain. Just stiff with L cervical rotation (12/22/2016)   Time 6   Period Weeks   Status Partially Met               Plan - 12/22/16 1624    Clinical Impression Statement Pt demonstrates overall decreased neck pain, decreased frequency of symptoms, decreased difficulty sitting in front of her desk to perform work related tasks and decreased symptoms with cervical rotation. Patient making very good progress  with PT towards goals. Per pt reports, stress also plays a factor with increased neck and L UE symptoms but the exercises seem to help her. Pt to continue performing her HEP x 4 weeks with a  follow up appointment to see how she does with them due to progress and patient preference.    Rehab Potential Fair   Clinical Impairments Affecting Rehab Potential pain   PT Frequency 2x / week   PT Duration 6 weeks   PT Treatment/Interventions Manual techniques;Therapeutic exercise;Therapeutic activities;Dry needling;Patient/family education;Neuromuscular re-education;Ultrasound;Traction;Iontophoresis 23m/ml Dexamethasone;Electrical Stimulation;Aquatic Therapy   PT Next Visit Plan cervical and thoracic mobility, scapular strengthening, posture   Consulted and Agree with Plan of Care Patient      Patient will benefit from skilled therapeutic intervention in order to improve the following deficits and impairments:  Pain, Postural dysfunction, Hypomobility, Decreased range of motion  Visit Diagnosis: Cervicalgia  Radiculopathy, cervical region     Problem List Patient Active Problem List   Diagnosis Date Noted  . MIGRAINES, HX OF 10/23/2008    MJoneen BoersPT, DPT   12/22/2016, 4:58 PM  [place] [facility] [address] [place] [address]Phone: [phone]  Fax:  [phone] Name: Lori GlasserMRN: [id number] of Birth: [birth year]

## 2016-12-27 ENCOUNTER — Ambulatory Visit: Payer: BLUE CROSS/BLUE SHIELD

## 2016-12-29 ENCOUNTER — Ambulatory Visit: Payer: BLUE CROSS/BLUE SHIELD

## 2016-12-29 DIAGNOSIS — F4323 Adjustment disorder with mixed anxiety and depressed mood: Secondary | ICD-10-CM | POA: Diagnosis not present

## 2017-01-05 DIAGNOSIS — R002 Palpitations: Secondary | ICD-10-CM | POA: Diagnosis not present

## 2017-01-05 DIAGNOSIS — R0789 Other chest pain: Secondary | ICD-10-CM | POA: Diagnosis not present

## 2017-01-05 DIAGNOSIS — R000 Tachycardia, unspecified: Secondary | ICD-10-CM | POA: Diagnosis not present

## 2017-01-05 DIAGNOSIS — F4323 Adjustment disorder with mixed anxiety and depressed mood: Secondary | ICD-10-CM | POA: Diagnosis not present

## 2017-01-10 ENCOUNTER — Encounter: Payer: Self-pay | Admitting: Internal Medicine

## 2017-01-10 ENCOUNTER — Ambulatory Visit (INDEPENDENT_AMBULATORY_CARE_PROVIDER_SITE_OTHER): Payer: BLUE CROSS/BLUE SHIELD | Admitting: Internal Medicine

## 2017-01-10 VITALS — BP 118/68 | HR 89 | Temp 98.3°F | Wt 241.8 lb

## 2017-01-10 DIAGNOSIS — E559 Vitamin D deficiency, unspecified: Secondary | ICD-10-CM

## 2017-01-10 DIAGNOSIS — R5383 Other fatigue: Secondary | ICD-10-CM

## 2017-01-10 DIAGNOSIS — F419 Anxiety disorder, unspecified: Secondary | ICD-10-CM

## 2017-01-10 LAB — VITAMIN D 25 HYDROXY (VIT D DEFICIENCY, FRACTURES): VITD: 27.15 ng/mL — AB (ref 30.00–100.00)

## 2017-01-10 LAB — VITAMIN B12: Vitamin B-12: 294 pg/mL (ref 211–911)

## 2017-01-10 NOTE — Patient Instructions (Signed)

## 2017-01-10 NOTE — Progress Notes (Signed)
Subjective:    Patient ID: Lori Huber, female    DOB: 11-Feb-1976, 41 y.o.   MRN: [id number]  HPI  Pt presents to the clinic today to follow up Vit D deficiency. Her last Vit D level was 18.31. She was started on Ergocalciferol 50000 units daily x 12 weeks. She has taken all the medication as prescribed. She never started taking any Vit D daily OTC. She has not noticed any improvement in her fatigue with supplementation of the Vit D. She gets about 7-9 hours of sleep at night. She is under a lot of stress and having anxiety issues. She is working with therapy about this.  Review of Systems      Past Medical History:  Diagnosis Date  . Anxiety   . Vitamin D deficiency     Current Outpatient Prescriptions  Medication Sig Dispense Refill  . ibuprofen (ADVIL,MOTRIN) 200 MG tablet Take 200 mg by mouth as needed.    Marland Kitchen levonorgestrel (MIRENA) 20 MCG/24HR IUD 1 each by Intrauterine route once.     No current facility-administered medications for this visit.     Allergies  Allergen Reactions  . Zoloft [Sertraline] Other (See Comments)    Chest pain, nausea, vomiting    Family History  Problem Relation Age of Onset  . Hypertension Father   . Hypertension Brother   . Breast cancer Brother   . Hypertension Paternal Grandmother   . Stroke Paternal Grandmother   . Heart disease Paternal Grandmother   . Diabetes Paternal Grandmother   . Cancer Neg Hx     Social History   Social History  . Marital status: Married    Spouse name: N/A  . Number of children: N/A  . Years of education: N/A   Occupational History  . Not on file.   Social History Main Topics  . Smoking status: Never Smoker  . Smokeless tobacco: Never Used  . Alcohol use No     Comment: occasional  . Drug use: No  . Sexual activity: Yes    Birth control/ protection: IUD   Other Topics Concern  . Not on file   Social History Narrative  . No narrative on file     Constitutional: Pt reports  fatigue. Denies fever, malaise, headache or abrupt weight changes.  Respiratory: Denies difficulty breathing, shortness of breath, cough or sputum production.   Cardiovascular: Denies chest pain, chest tightness, palpitations or swelling in the hands or feet.  Neurological: Denies dizziness, difficulty with memory, difficulty with speech or problems with balance and coordination.  Psych: Pt reports anxiety. Denies depression, SI/HI.  No other specific complaints in a complete review of systems (except as listed in HPI above).  Objective:   Physical Exam  BP 118/68   Pulse 89   Temp 98.3 F (36.8 C) (Oral)   Wt 241 lb 12 oz (109.7 kg)   SpO2 98%   BMI 37.86 kg/m  Wt Readings from Last 3 Encounters:  01/10/17 241 lb 12 oz (109.7 kg)  10/29/16 245 lb (111.1 kg)  10/04/16 246 lb (111.6 kg)    General: Appears her stated age, obese in NAD. Neurological: Alert and oriented.  Psychiatric: She is mildly anxious appearing.  BMET    Component Value Date/Time   NA 137 10/04/2016 0956   K 4.1 10/04/2016 0956   CL 106 10/04/2016 0956   CO2 24 10/04/2016 0956   GLUCOSE 95 10/04/2016 0956   BUN 10 10/04/2016 0956   CREATININE  0.78 10/04/2016 0956   CALCIUM 9.4 10/04/2016 0956   GFRNONAA >60 08/29/2016 0410   GFRAA >60 08/29/2016 0410    Lipid Panel     Component Value Date/Time   CHOL 172 11/26/2013 1421   TRIG 118.0 11/26/2013 1421   HDL 39.80 11/26/2013 1421   CHOLHDL 4 11/26/2013 1421   VLDL 23.6 11/26/2013 1421   LDLCALC 109 (H) 11/26/2013 1421    CBC    Component Value Date/Time   WBC 9.4 10/04/2016 0956   RBC 4.58 10/04/2016 0956   HGB 14.2 10/04/2016 0956   HCT 41.2 10/04/2016 0956   PLT 278.0 10/04/2016 0956   MCV 90.0 10/04/2016 0956   MCH 31.3 08/29/2016 0410   MCHC 34.6 10/04/2016 0956   RDW 13.7 10/04/2016 0956   LYMPHSABS 2.3 06/04/2015 1629   MONOABS 0.8 06/04/2015 1629   EOSABS 0.2 06/04/2015 1629   BASOSABS 0.0 06/04/2015 1629    Hgb A1C Lab  Results  Component Value Date   HGBA1C 5.3 10/05/2016            Assessment & Plan:   Fatigue:  B12 level today  Vit D Deficiency:  Vit D level today  Anxiety:  She is not interested in medication management She will continue to work with therapy  RTC as needed or if symptoms persist or worsen Nicki Reaper, NP

## 2017-01-11 ENCOUNTER — Encounter: Payer: Self-pay | Admitting: Internal Medicine

## 2017-01-12 DIAGNOSIS — F4323 Adjustment disorder with mixed anxiety and depressed mood: Secondary | ICD-10-CM | POA: Diagnosis not present

## 2017-01-12 MED ORDER — VITAMIN D (ERGOCALCIFEROL) 1.25 MG (50000 UNIT) PO CAPS: 50000.0000 [IU] | ORAL_CAPSULE | ORAL | 0 refills | Status: DC

## 2017-01-19 ENCOUNTER — Ambulatory Visit: Payer: BLUE CROSS/BLUE SHIELD

## 2017-01-19 DIAGNOSIS — F4323 Adjustment disorder with mixed anxiety and depressed mood: Secondary | ICD-10-CM | POA: Diagnosis not present

## 2017-01-20 ENCOUNTER — Ambulatory Visit (INDEPENDENT_AMBULATORY_CARE_PROVIDER_SITE_OTHER): Payer: BLUE CROSS/BLUE SHIELD | Admitting: *Deleted

## 2017-01-20 DIAGNOSIS — E538 Deficiency of other specified B group vitamins: Secondary | ICD-10-CM | POA: Diagnosis not present

## 2017-01-20 MED ORDER — CYANOCOBALAMIN 1000 MCG/ML IJ SOLN
1000.0000 ug | INTRAMUSCULAR | Status: DC
  Administered 2017-01-20 – 2017-03-30 (×2): 1000 ug via INTRAMUSCULAR

## 2017-01-26 DIAGNOSIS — F4323 Adjustment disorder with mixed anxiety and depressed mood: Secondary | ICD-10-CM | POA: Diagnosis not present

## 2017-02-09 DIAGNOSIS — F411 Generalized anxiety disorder: Secondary | ICD-10-CM | POA: Diagnosis not present

## 2017-02-16 DIAGNOSIS — F411 Generalized anxiety disorder: Secondary | ICD-10-CM | POA: Diagnosis not present

## 2017-02-17 DIAGNOSIS — M5442 Lumbago with sciatica, left side: Secondary | ICD-10-CM | POA: Diagnosis not present

## 2017-02-17 DIAGNOSIS — M9901 Segmental and somatic dysfunction of cervical region: Secondary | ICD-10-CM | POA: Diagnosis not present

## 2017-02-17 DIAGNOSIS — M9903 Segmental and somatic dysfunction of lumbar region: Secondary | ICD-10-CM | POA: Diagnosis not present

## 2017-02-17 DIAGNOSIS — M531 Cervicobrachial syndrome: Secondary | ICD-10-CM | POA: Diagnosis not present

## 2017-02-18 DIAGNOSIS — L918 Other hypertrophic disorders of the skin: Secondary | ICD-10-CM | POA: Diagnosis not present

## 2017-02-18 DIAGNOSIS — L814 Other melanin hyperpigmentation: Secondary | ICD-10-CM | POA: Diagnosis not present

## 2017-02-21 DIAGNOSIS — F411 Generalized anxiety disorder: Secondary | ICD-10-CM | POA: Diagnosis not present

## 2017-02-21 DIAGNOSIS — M9901 Segmental and somatic dysfunction of cervical region: Secondary | ICD-10-CM | POA: Diagnosis not present

## 2017-02-21 DIAGNOSIS — M5442 Lumbago with sciatica, left side: Secondary | ICD-10-CM | POA: Diagnosis not present

## 2017-02-21 DIAGNOSIS — M9903 Segmental and somatic dysfunction of lumbar region: Secondary | ICD-10-CM | POA: Diagnosis not present

## 2017-02-21 DIAGNOSIS — M531 Cervicobrachial syndrome: Secondary | ICD-10-CM | POA: Diagnosis not present

## 2017-02-22 DIAGNOSIS — M9901 Segmental and somatic dysfunction of cervical region: Secondary | ICD-10-CM | POA: Diagnosis not present

## 2017-02-22 DIAGNOSIS — M5442 Lumbago with sciatica, left side: Secondary | ICD-10-CM | POA: Diagnosis not present

## 2017-02-22 DIAGNOSIS — M9903 Segmental and somatic dysfunction of lumbar region: Secondary | ICD-10-CM | POA: Diagnosis not present

## 2017-02-22 DIAGNOSIS — M531 Cervicobrachial syndrome: Secondary | ICD-10-CM | POA: Diagnosis not present

## 2017-02-23 ENCOUNTER — Ambulatory Visit (INDEPENDENT_AMBULATORY_CARE_PROVIDER_SITE_OTHER): Payer: BLUE CROSS/BLUE SHIELD

## 2017-02-23 DIAGNOSIS — F411 Generalized anxiety disorder: Secondary | ICD-10-CM | POA: Diagnosis not present

## 2017-02-23 DIAGNOSIS — E538 Deficiency of other specified B group vitamins: Secondary | ICD-10-CM

## 2017-02-23 MED ORDER — CYANOCOBALAMIN 1000 MCG/ML IJ SOLN
1000.0000 ug | Freq: Once | INTRAMUSCULAR | Status: AC
  Administered 2017-02-23: 1000 ug via INTRAMUSCULAR

## 2017-02-24 DIAGNOSIS — M531 Cervicobrachial syndrome: Secondary | ICD-10-CM | POA: Diagnosis not present

## 2017-02-24 DIAGNOSIS — M9901 Segmental and somatic dysfunction of cervical region: Secondary | ICD-10-CM | POA: Diagnosis not present

## 2017-02-24 DIAGNOSIS — M9903 Segmental and somatic dysfunction of lumbar region: Secondary | ICD-10-CM | POA: Diagnosis not present

## 2017-02-24 DIAGNOSIS — M5442 Lumbago with sciatica, left side: Secondary | ICD-10-CM | POA: Diagnosis not present

## 2017-03-02 DIAGNOSIS — M5442 Lumbago with sciatica, left side: Secondary | ICD-10-CM | POA: Diagnosis not present

## 2017-03-02 DIAGNOSIS — M9903 Segmental and somatic dysfunction of lumbar region: Secondary | ICD-10-CM | POA: Diagnosis not present

## 2017-03-02 DIAGNOSIS — M531 Cervicobrachial syndrome: Secondary | ICD-10-CM | POA: Diagnosis not present

## 2017-03-02 DIAGNOSIS — F411 Generalized anxiety disorder: Secondary | ICD-10-CM | POA: Diagnosis not present

## 2017-03-02 DIAGNOSIS — M9901 Segmental and somatic dysfunction of cervical region: Secondary | ICD-10-CM | POA: Diagnosis not present

## 2017-03-09 DIAGNOSIS — F411 Generalized anxiety disorder: Secondary | ICD-10-CM | POA: Diagnosis not present

## 2017-03-10 DIAGNOSIS — M9901 Segmental and somatic dysfunction of cervical region: Secondary | ICD-10-CM | POA: Diagnosis not present

## 2017-03-10 DIAGNOSIS — M531 Cervicobrachial syndrome: Secondary | ICD-10-CM | POA: Diagnosis not present

## 2017-03-10 DIAGNOSIS — M9903 Segmental and somatic dysfunction of lumbar region: Secondary | ICD-10-CM | POA: Diagnosis not present

## 2017-03-10 DIAGNOSIS — M5442 Lumbago with sciatica, left side: Secondary | ICD-10-CM | POA: Diagnosis not present

## 2017-03-15 DIAGNOSIS — M9901 Segmental and somatic dysfunction of cervical region: Secondary | ICD-10-CM | POA: Diagnosis not present

## 2017-03-15 DIAGNOSIS — M5442 Lumbago with sciatica, left side: Secondary | ICD-10-CM | POA: Diagnosis not present

## 2017-03-15 DIAGNOSIS — F411 Generalized anxiety disorder: Secondary | ICD-10-CM | POA: Diagnosis not present

## 2017-03-15 DIAGNOSIS — M9903 Segmental and somatic dysfunction of lumbar region: Secondary | ICD-10-CM | POA: Diagnosis not present

## 2017-03-15 DIAGNOSIS — M531 Cervicobrachial syndrome: Secondary | ICD-10-CM | POA: Diagnosis not present

## 2017-03-22 DIAGNOSIS — M5442 Lumbago with sciatica, left side: Secondary | ICD-10-CM | POA: Diagnosis not present

## 2017-03-22 DIAGNOSIS — M9903 Segmental and somatic dysfunction of lumbar region: Secondary | ICD-10-CM | POA: Diagnosis not present

## 2017-03-22 DIAGNOSIS — M9901 Segmental and somatic dysfunction of cervical region: Secondary | ICD-10-CM | POA: Diagnosis not present

## 2017-03-22 DIAGNOSIS — M531 Cervicobrachial syndrome: Secondary | ICD-10-CM | POA: Diagnosis not present

## 2017-03-23 DIAGNOSIS — F411 Generalized anxiety disorder: Secondary | ICD-10-CM | POA: Diagnosis not present

## 2017-03-29 DIAGNOSIS — F411 Generalized anxiety disorder: Secondary | ICD-10-CM | POA: Diagnosis not present

## 2017-03-30 ENCOUNTER — Ambulatory Visit (INDEPENDENT_AMBULATORY_CARE_PROVIDER_SITE_OTHER): Payer: BLUE CROSS/BLUE SHIELD | Admitting: *Deleted

## 2017-03-30 DIAGNOSIS — E538 Deficiency of other specified B group vitamins: Secondary | ICD-10-CM

## 2017-04-04 ENCOUNTER — Other Ambulatory Visit: Payer: Self-pay | Admitting: Otolaryngology

## 2017-04-04 DIAGNOSIS — E041 Nontoxic single thyroid nodule: Secondary | ICD-10-CM | POA: Diagnosis not present

## 2017-04-04 DIAGNOSIS — H6123 Impacted cerumen, bilateral: Secondary | ICD-10-CM | POA: Diagnosis not present

## 2017-04-05 ENCOUNTER — Encounter: Payer: Self-pay | Admitting: Internal Medicine

## 2017-04-05 ENCOUNTER — Ambulatory Visit (INDEPENDENT_AMBULATORY_CARE_PROVIDER_SITE_OTHER): Payer: BLUE CROSS/BLUE SHIELD | Admitting: Internal Medicine

## 2017-04-05 VITALS — BP 120/80 | HR 76 | Temp 98.1°F | Wt 236.0 lb

## 2017-04-05 DIAGNOSIS — E538 Deficiency of other specified B group vitamins: Secondary | ICD-10-CM

## 2017-04-05 DIAGNOSIS — E041 Nontoxic single thyroid nodule: Secondary | ICD-10-CM | POA: Diagnosis not present

## 2017-04-05 DIAGNOSIS — R5383 Other fatigue: Secondary | ICD-10-CM

## 2017-04-05 DIAGNOSIS — K580 Irritable bowel syndrome with diarrhea: Secondary | ICD-10-CM | POA: Diagnosis not present

## 2017-04-05 DIAGNOSIS — E559 Vitamin D deficiency, unspecified: Secondary | ICD-10-CM

## 2017-04-05 LAB — VITAMIN B12: Vitamin B-12: 577 pg/mL (ref 211–911)

## 2017-04-05 LAB — T4, FREE: Free T4: 0.69 ng/dL (ref 0.60–1.60)

## 2017-04-05 LAB — TSH: TSH: 2.52 u[IU]/mL (ref 0.35–4.50)

## 2017-04-05 LAB — VITAMIN D 25 HYDROXY (VIT D DEFICIENCY, FRACTURES): VITD: 27.5 ng/mL — ABNORMAL LOW (ref 30.00–100.00)

## 2017-04-05 MED ORDER — VITAMIN D (ERGOCALCIFEROL) 1.25 MG (50000 UNIT) PO CAPS: 50000.0000 [IU] | ORAL_CAPSULE | ORAL | 0 refills | Status: DC

## 2017-04-05 NOTE — Patient Instructions (Signed)
Diet for Irritable Bowel Syndrome When you have irritable bowel syndrome (IBS), the foods you eat and your eating habits are very important. IBS may cause various symptoms, such as abdominal pain, constipation, or diarrhea. Choosing the right foods can help ease discomfort caused by these symptoms. Work with your health care provider and dietitian to find the best eating plan to help control your symptoms. What general guidelines do I need to follow?  Keep a food diary. This will help you identify foods that cause symptoms. Write down: ? What you eat and when. ? What symptoms you have. ? When symptoms occur in relation to your meals.  Avoid foods that cause symptoms. Talk with your dietitian about other ways to get the same nutrients that are in these foods.  Eat more foods that contain fiber. Take a fiber supplement if directed by your dietitian.  Eat your meals slowly, in a relaxed setting.  Aim to eat 5-6 small meals per day. Do not skip meals.  Drink enough fluids to keep your urine clear or pale yellow.  Ask your health care provider if you should take an over-the-counter probiotic during flare-ups to help restore healthy gut bacteria.  If you have cramping or diarrhea, try making your meals low in fat and high in carbohydrates. Examples of carbohydrates are pasta, rice, whole grain breads and cereals, fruits, and vegetables.  If dairy products cause your symptoms to flare up, try eating less of them. You might be able to handle yogurt better than other dairy products because it contains bacteria that help with digestion. What foods are not recommended? The following are some foods and drinks that may worsen your symptoms:  Fatty foods, such as French fries.  Milk products, such as cheese or ice cream.  Chocolate.  Alcohol.  Products with caffeine, such as coffee.  Carbonated drinks, such as soda.  The items listed above may not be a complete list of foods and beverages to  avoid. Contact your dietitian for more information. What foods are good sources of fiber? Your health care provider or dietitian may recommend that you eat more foods that contain fiber. Fiber can help reduce constipation and other IBS symptoms. Add foods with fiber to your diet a little at a time so that your body can get used to them. Too much fiber at once might cause gas and swelling of your abdomen. The following are some foods that are good sources of fiber:  Apples.  Peaches.  Pears.  Berries.  Figs.  Broccoli (raw).  Cabbage.  Carrots.  Raw peas.  Kidney beans.  Lima beans.  Whole grain bread.  Whole grain cereal.  Where to find more information: International Foundation for Functional Gastrointestinal Disorders: www.iffgd.org National Institute of Diabetes and Digestive and Kidney Diseases: www.niddk.nih.gov/health-information/health-topics/digestive-diseases/ibs/Pages/facts.aspx This information is not intended to replace advice given to you by your health care provider. Make sure you discuss any questions you have with your health care provider. Document Released: 11/20/2003 Document Revised: 02/05/2016 Document Reviewed: 11/30/2013 Elsevier Interactive Patient Education  2018 Elsevier Inc.  

## 2017-04-05 NOTE — Progress Notes (Signed)
Subjective:    Patient ID: Lori Huber, female    DOB: [birth year], 41 y.o.   MRN: [id number]  HPI  Pt presents to the clinic today requesting to have her Vit D and B12 rechecked. She has been receiving supplements since January. She has not really noticed much improvement in the way of her fatigue. She reports she has been more active lately, exercising 3 x week with her husband. She does not feel like her energy levels have improved. She reports she sleeps well, averaging 6-7 hours per night. She feels rested when she wakes up but feels progressively more tired throughout the day. She never naps. She is having issues with stress and anxiety.  She also c/o intermittent nausea and loose stools. She reports that a few times per week, she will have fecal urgency about 30 minutes after a meal. She denies fecal incontinence. She denies abdominal pain, vomiting, constipation or blood in her stool. She has been keeping a food diary, but can not pinpoint anything that may be causing her issues.  She also wants me to know that she was ENT yesterday. They found some nodules on her thyroid. They have set her up for a thyroid ultrasound.   Review of Systems      Past Medical History:  Diagnosis Date  . Anxiety   . Vitamin D deficiency     Current Outpatient Prescriptions  Medication Sig Dispense Refill  . ibuprofen (ADVIL,MOTRIN) 200 MG tablet Take 200 mg by mouth as needed.    Marland Kitchen levonorgestrel (MIRENA) 20 MCG/24HR IUD 1 each by Intrauterine route once.     Current Facility-Administered Medications  Medication Dose Route Frequency Provider Last Rate Last Dose  . cyanocobalamin ((VITAMIN B-12)) injection 1,000 mcg  1,000 mcg Intramuscular Q30 days Lorre Munroe, NP   1,000 mcg at 03/30/17 1557    Allergies  Allergen Reactions  . Zoloft [Sertraline] Other (See Comments)    Chest pain, nausea, vomiting    Family History  Problem Relation Age of Onset  . Hypertension Father   .  Hypertension Brother   . Breast cancer Brother   . Hypertension Paternal Grandmother   . Stroke Paternal Grandmother   . Heart disease Paternal Grandmother   . Diabetes Paternal Grandmother   . Cancer Neg Hx     Social History   Social History  . Marital status: Married    Spouse name: N/A  . Number of children: N/A  . Years of education: N/A   Occupational History  . Not on file.   Social History Main Topics  . Smoking status: Never Smoker  . Smokeless tobacco: Never Used  . Alcohol use No     Comment: occasional  . Drug use: No  . Sexual activity: Yes    Birth control/ protection: IUD   Other Topics Concern  . Not on file   Social History Narrative  . No narrative on file     Constitutional: Pt reports fatigue. Denies fever, malaise, headache or abrupt weight changes.  Respiratory: Denies difficulty breathing, shortness of breath, cough or sputum production.   Cardiovascular: Denies chest pain, chest tightness, palpitations or swelling in the hands or feet.  Gastrointestinal: Pt reports loose stools. Denies abdominal pain, bloating, diarrhea or blood in the stool.  Neurological: Denies dizziness, difficulty with memory, difficulty with speech or problems with balance and coordination.  Psych: Pt reports stress and anxiety. Denies depression, SI/HI.  No other specific complaints in a  complete review of systems (except as listed in HPI above).  Objective:   Physical Exam   BP 120/80   Pulse 76   Temp 98.1 F (36.7 C) (Oral)   Wt 236 lb (107 kg)   SpO2 98%   BMI 36.96 kg/m  Wt Readings from Last 3 Encounters:  04/05/17 236 lb (107 kg)  01/10/17 241 lb 12 oz (109.7 kg)  10/29/16 245 lb (111.1 kg)    General: Appears her stated age, obese in NAD. Abdomen: Soft and nontender. Normal bowel sounds. No distention or masses noted.  Neurological: Alert and oriented.  Psychiatric: Mood and affect normal. Behavior is normal. Judgment and thought content normal.     BMET    Component Value Date/Time   NA 137 10/04/2016 0956   K 4.1 10/04/2016 0956   CL 106 10/04/2016 0956   CO2 24 10/04/2016 0956   GLUCOSE 95 10/04/2016 0956   BUN 10 10/04/2016 0956   CREATININE 0.78 10/04/2016 0956   CALCIUM 9.4 10/04/2016 0956   GFRNONAA >60 08/29/2016 0410   GFRAA >60 08/29/2016 0410    Lipid Panel     Component Value Date/Time   CHOL 172 11/26/2013 1421   TRIG 118.0 11/26/2013 1421   HDL 39.80 11/26/2013 1421   CHOLHDL 4 11/26/2013 1421   VLDL 23.6 11/26/2013 1421   LDLCALC 109 (H) 11/26/2013 1421    CBC    Component Value Date/Time   WBC 9.4 10/04/2016 0956   RBC 4.58 10/04/2016 0956   HGB 14.2 10/04/2016 0956   HCT 41.2 10/04/2016 0956   PLT 278.0 10/04/2016 0956   MCV 90.0 10/04/2016 0956   MCH 31.3 08/29/2016 0410   MCHC 34.6 10/04/2016 0956   RDW 13.7 10/04/2016 0956   LYMPHSABS 2.3 06/04/2015 1629   MONOABS 0.8 06/04/2015 1629   EOSABS 0.2 06/04/2015 1629   BASOSABS 0.0 06/04/2015 1629    Hgb A1C Lab Results  Component Value Date   HGBA1C 5.3 10/05/2016           Assessment & Plan:   Fatigue, B12 Deficiency, Vit D Deficiency:  Will check B12 and Vit D today Encouraged her to continue to exercise  IBS- D:  Discussed treatment with Imodium, Bentyl and low dose SSRI She wants to refrain from medications at this time Can refer to GI if worse  Thyroid Nodule:  Awaiting ultrasound TSH and Free T4 today  Return precautions discussed, will follow up after labs Nicki ReaperBAITY, Avianna Moynahan, NP

## 2017-04-06 ENCOUNTER — Ambulatory Visit
Admission: RE | Admit: 2017-04-06 | Discharge: 2017-04-06 | Disposition: A | Discharge status: Home / Self Care | Payer: BLUE CROSS/BLUE SHIELD | Source: Ambulatory Visit | Attending: Otolaryngology | Admitting: Otolaryngology

## 2017-04-06 DIAGNOSIS — F411 Generalized anxiety disorder: Secondary | ICD-10-CM | POA: Diagnosis not present

## 2017-04-06 DIAGNOSIS — E042 Nontoxic multinodular goiter: Secondary | ICD-10-CM | POA: Diagnosis not present

## 2017-04-06 DIAGNOSIS — E041 Nontoxic single thyroid nodule: Secondary | ICD-10-CM | POA: Diagnosis not present

## 2017-04-12 DIAGNOSIS — M5442 Lumbago with sciatica, left side: Secondary | ICD-10-CM | POA: Diagnosis not present

## 2017-04-12 DIAGNOSIS — M9903 Segmental and somatic dysfunction of lumbar region: Secondary | ICD-10-CM | POA: Diagnosis not present

## 2017-04-12 DIAGNOSIS — M9901 Segmental and somatic dysfunction of cervical region: Secondary | ICD-10-CM | POA: Diagnosis not present

## 2017-04-12 DIAGNOSIS — M531 Cervicobrachial syndrome: Secondary | ICD-10-CM | POA: Diagnosis not present

## 2017-04-13 DIAGNOSIS — F411 Generalized anxiety disorder: Secondary | ICD-10-CM | POA: Diagnosis not present

## 2017-04-26 DIAGNOSIS — F411 Generalized anxiety disorder: Secondary | ICD-10-CM | POA: Diagnosis not present

## 2017-04-26 DIAGNOSIS — M531 Cervicobrachial syndrome: Secondary | ICD-10-CM | POA: Diagnosis not present

## 2017-04-26 DIAGNOSIS — M9901 Segmental and somatic dysfunction of cervical region: Secondary | ICD-10-CM | POA: Diagnosis not present

## 2017-04-26 DIAGNOSIS — M9903 Segmental and somatic dysfunction of lumbar region: Secondary | ICD-10-CM | POA: Diagnosis not present

## 2017-04-26 DIAGNOSIS — M5442 Lumbago with sciatica, left side: Secondary | ICD-10-CM | POA: Diagnosis not present

## 2017-05-04 DIAGNOSIS — F411 Generalized anxiety disorder: Secondary | ICD-10-CM | POA: Diagnosis not present

## 2017-05-11 DIAGNOSIS — F411 Generalized anxiety disorder: Secondary | ICD-10-CM | POA: Diagnosis not present

## 2017-06-01 DIAGNOSIS — F411 Generalized anxiety disorder: Secondary | ICD-10-CM | POA: Diagnosis not present

## 2017-06-08 DIAGNOSIS — F411 Generalized anxiety disorder: Secondary | ICD-10-CM | POA: Diagnosis not present

## 2017-06-15 DIAGNOSIS — F411 Generalized anxiety disorder: Secondary | ICD-10-CM | POA: Diagnosis not present

## 2017-06-22 DIAGNOSIS — F411 Generalized anxiety disorder: Secondary | ICD-10-CM | POA: Diagnosis not present

## 2017-06-24 ENCOUNTER — Encounter: Payer: Self-pay | Admitting: Internal Medicine

## 2017-06-24 DIAGNOSIS — E559 Vitamin D deficiency, unspecified: Secondary | ICD-10-CM

## 2017-06-27 DIAGNOSIS — E041 Nontoxic single thyroid nodule: Secondary | ICD-10-CM | POA: Diagnosis not present

## 2017-06-27 DIAGNOSIS — H6123 Impacted cerumen, bilateral: Secondary | ICD-10-CM | POA: Diagnosis not present

## 2017-06-29 ENCOUNTER — Other Ambulatory Visit (INDEPENDENT_AMBULATORY_CARE_PROVIDER_SITE_OTHER): Payer: BLUE CROSS/BLUE SHIELD

## 2017-06-29 DIAGNOSIS — E538 Deficiency of other specified B group vitamins: Secondary | ICD-10-CM

## 2017-06-29 DIAGNOSIS — E559 Vitamin D deficiency, unspecified: Secondary | ICD-10-CM | POA: Diagnosis not present

## 2017-06-29 DIAGNOSIS — F411 Generalized anxiety disorder: Secondary | ICD-10-CM | POA: Diagnosis not present

## 2017-06-29 LAB — TSH: TSH: 4.13 u[IU]/mL (ref 0.35–4.50)

## 2017-06-29 LAB — VITAMIN B12: VITAMIN B 12: 318 pg/mL (ref 211–911)

## 2017-06-29 LAB — VITAMIN D 25 HYDROXY (VIT D DEFICIENCY, FRACTURES): VITD: 32.09 ng/mL (ref 30.00–100.00)

## 2017-06-29 LAB — T4, FREE: FREE T4: 0.7 ng/dL (ref 0.60–1.60)

## 2017-07-06 DIAGNOSIS — F411 Generalized anxiety disorder: Secondary | ICD-10-CM | POA: Diagnosis not present

## 2017-07-07 ENCOUNTER — Encounter: Payer: Self-pay | Admitting: Internal Medicine

## 2017-07-13 DIAGNOSIS — F411 Generalized anxiety disorder: Secondary | ICD-10-CM | POA: Diagnosis not present

## 2017-07-20 DIAGNOSIS — F411 Generalized anxiety disorder: Secondary | ICD-10-CM | POA: Diagnosis not present

## 2017-07-27 DIAGNOSIS — F411 Generalized anxiety disorder: Secondary | ICD-10-CM | POA: Diagnosis not present

## 2017-08-10 DIAGNOSIS — F411 Generalized anxiety disorder: Secondary | ICD-10-CM | POA: Diagnosis not present

## 2017-08-17 DIAGNOSIS — F411 Generalized anxiety disorder: Secondary | ICD-10-CM | POA: Diagnosis not present

## 2017-08-24 DIAGNOSIS — F411 Generalized anxiety disorder: Secondary | ICD-10-CM | POA: Diagnosis not present

## 2017-08-25 ENCOUNTER — Encounter: Payer: Self-pay | Admitting: Internal Medicine

## 2017-08-30 ENCOUNTER — Ambulatory Visit (INDEPENDENT_AMBULATORY_CARE_PROVIDER_SITE_OTHER): Payer: BLUE CROSS/BLUE SHIELD | Admitting: Internal Medicine

## 2017-08-30 ENCOUNTER — Encounter: Payer: Self-pay | Admitting: Internal Medicine

## 2017-08-30 ENCOUNTER — Ambulatory Visit: Payer: Self-pay | Admitting: Internal Medicine

## 2017-08-30 VITALS — BP 122/78 | HR 74 | Temp 98.0°F | Wt 220.0 lb

## 2017-08-30 DIAGNOSIS — F418 Other specified anxiety disorders: Secondary | ICD-10-CM

## 2017-08-30 MED ORDER — LORAZEPAM 0.5 MG PO TABS: 0.5000 mg | ORAL_TABLET | Freq: Two times a day (BID) | ORAL | 0 refills | Status: DC | PRN

## 2017-08-30 NOTE — Progress Notes (Signed)
Subjective:    Patient ID: Lori Huber, female    DOB: 08/26/1976, 41 y.o.   MRN: [id number]  HPI  Pt presents to the clinic today to discuss anxiety. 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 She reports she hast 2 long flights 13+ and 3 shorts flights < 6 hours. She has taken this trip before but has been having a lot of health related anxiety over the last year. She had a bad reaction to Zoloft and since then her anxiety has been out of control. She is seeing a therapist monthly. Her therapist advised her to come today to discuss her flight anxiety.  Review of Systems      Past Medical History:  Diagnosis Date  . Anxiety   . Vitamin D deficiency     Current Outpatient Medications  Medication Sig Dispense Refill  . ibuprofen (ADVIL,MOTRIN) 200 MG tablet Take 200 mg by mouth as needed.    Marland Kitchen. levonorgestrel (MIRENA) 20 MCG/24HR IUD 1 each by Intrauterine route once.    Marland Kitchen. LORazepam (ATIVAN) 0.5 MG tablet Take 1 tablet (0.5 mg total) by mouth 2 (two) times daily as needed for anxiety. 15 tablet 0   Current Facility-Administered Medications  Medication Dose Route Frequency Provider Last Rate Last Dose  . cyanocobalamin ((VITAMIN B-12)) injection 1,000 mcg  1,000 mcg Intramuscular Q30 days Lorre MunroeBaity, Mackenzy Grumbine W, NP   1,000 mcg at 03/30/17 1557    Allergies  Allergen Reactions  . Zoloft [Sertraline] Other (See Comments)    Chest pain, nausea, vomiting    Family History  Problem Relation Age of Onset  . Hypertension Father   . Hypertension Brother   . Breast cancer Brother   . Hypertension Paternal Grandmother   . Stroke Paternal Grandmother   . Heart disease Paternal Grandmother   . Diabetes Paternal Grandmother   . Cancer Neg Hx     Social History   Socioeconomic History  . Marital status: Married    Spouse name: Not on file  . Number of children: Not on file  . Years of education: Not on file  . Highest education  level: Not on file  Social Needs  . Financial resource strain: Not on file  . Food insecurity - worry: Not on file  . Food insecurity - inability: Not on file  . Transportation needs - medical: Not on file  . Transportation needs - non-medical: Not on file  Occupational History  . Not on file  Tobacco Use  . Smoking status: Never Smoker  . Smokeless tobacco: Never Used  Substance and Sexual Activity  . Alcohol use: No    Alcohol/week: 4.2 oz    Types: 7 Glasses of wine per week    Comment: occasional  . Drug use: No  . Sexual activity: Yes    Birth control/protection: IUD  Other Topics Concern  . Not on file  Social History Narrative  . Not on file     Constitutional: Denies fever, malaise, fatigue, headache or abrupt weight changes.  Psych: Pt reports anxiety. Denies depression, SI/HI.  No other specific complaints in a complete review of systems (except as listed in HPI above).  Objective:   Physical Exam   BP 122/78   Pulse 74   Temp 98 F (36.7 C) (Oral)   Wt 220 lb (99.8 kg)   SpO2 98%   BMI 34.46 kg/m  Wt Readings from Last 3 Encounters:  08/30/17 220 lb (99.8 kg)  04/05/17 236 lb (107 kg)  01/10/17 241 lb 12 oz (109.7 kg)    General: Appears her stated age, well developed, well nourished in NAD. Neurological: Alert and oriented.  Psychiatric: Mood and affect normal. Behavior is normal. Judgment and thought content normal.     BMET    Component Value Date/Time   NA 137 10/04/2016 0956   K 4.1 10/04/2016 0956   CL 106 10/04/2016 0956   CO2 24 10/04/2016 0956   GLUCOSE 95 10/04/2016 0956   BUN 10 10/04/2016 0956   CREATININE 0.78 10/04/2016 0956   CALCIUM 9.4 10/04/2016 0956   GFRNONAA >60 08/29/2016 0410   GFRAA >60 08/29/2016 0410    Lipid Panel     Component Value Date/Time   CHOL 172 11/26/2013 1421   TRIG 118.0 11/26/2013 1421   HDL 39.80 11/26/2013 1421   CHOLHDL 4 11/26/2013 1421   VLDL 23.6 11/26/2013 1421   LDLCALC 109 (H)  11/26/2013 1421    CBC    Component Value Date/Time   WBC 9.4 10/04/2016 0956   RBC 4.58 10/04/2016 0956   HGB 14.2 10/04/2016 0956   HCT 41.2 10/04/2016 0956   PLT 278.0 10/04/2016 0956   MCV 90.0 10/04/2016 0956   MCH 31.3 08/29/2016 0410   MCHC 34.6 10/04/2016 0956   RDW 13.7 10/04/2016 0956   LYMPHSABS 2.3 06/04/2015 1629   MONOABS 0.8 06/04/2015 1629   EOSABS 0.2 06/04/2015 1629   BASOSABS 0.0 06/04/2015 1629    Hgb A1C Lab Results  Component Value Date   HGBA1C 5.3 10/05/2016           Assessment & Plan:   Flight Anxiety:  Discussed Hydroxyzine, Xanax and Ativan Will try Ativan 0.5 mg 30 minutes prior to her flight- sedation and addiction precaution given She will continue to work with her therapist on a regular basis  Return precautions discussed Nicki ReaperBAITY, Janeah Kovacich, NP

## 2017-08-30 NOTE — Patient Instructions (Signed)

## 2017-08-31 DIAGNOSIS — F411 Generalized anxiety disorder: Secondary | ICD-10-CM | POA: Diagnosis not present

## 2017-09-14 ENCOUNTER — Encounter: Payer: Self-pay | Admitting: Radiology

## 2017-09-14 DIAGNOSIS — F411 Generalized anxiety disorder: Secondary | ICD-10-CM | POA: Diagnosis not present

## 2017-10-07 DIAGNOSIS — H6980 Other specified disorders of Eustachian tube, unspecified ear: Secondary | ICD-10-CM | POA: Diagnosis not present

## 2017-10-07 DIAGNOSIS — H6123 Impacted cerumen, bilateral: Secondary | ICD-10-CM | POA: Diagnosis not present

## 2017-10-12 DIAGNOSIS — F411 Generalized anxiety disorder: Secondary | ICD-10-CM | POA: Diagnosis not present

## 2017-10-19 DIAGNOSIS — F411 Generalized anxiety disorder: Secondary | ICD-10-CM | POA: Diagnosis not present

## 2017-10-24 DIAGNOSIS — F411 Generalized anxiety disorder: Secondary | ICD-10-CM | POA: Diagnosis not present

## 2017-10-26 DIAGNOSIS — F411 Generalized anxiety disorder: Secondary | ICD-10-CM | POA: Diagnosis not present

## 2017-11-02 DIAGNOSIS — F411 Generalized anxiety disorder: Secondary | ICD-10-CM | POA: Diagnosis not present

## 2017-11-16 DIAGNOSIS — F411 Generalized anxiety disorder: Secondary | ICD-10-CM | POA: Diagnosis not present

## 2017-11-19 IMAGING — MG DIGITAL SCREENING BILATERAL MAMMOGRAM WITH CAD
4 series · 4 of 4 positions shown · non-contrast
Comparison: Previous exam(s).

CLINICAL DATA: Screening.

EXAM:
DIGITAL SCREENING BILATERAL MAMMOGRAM WITH CAD

[R MLO]
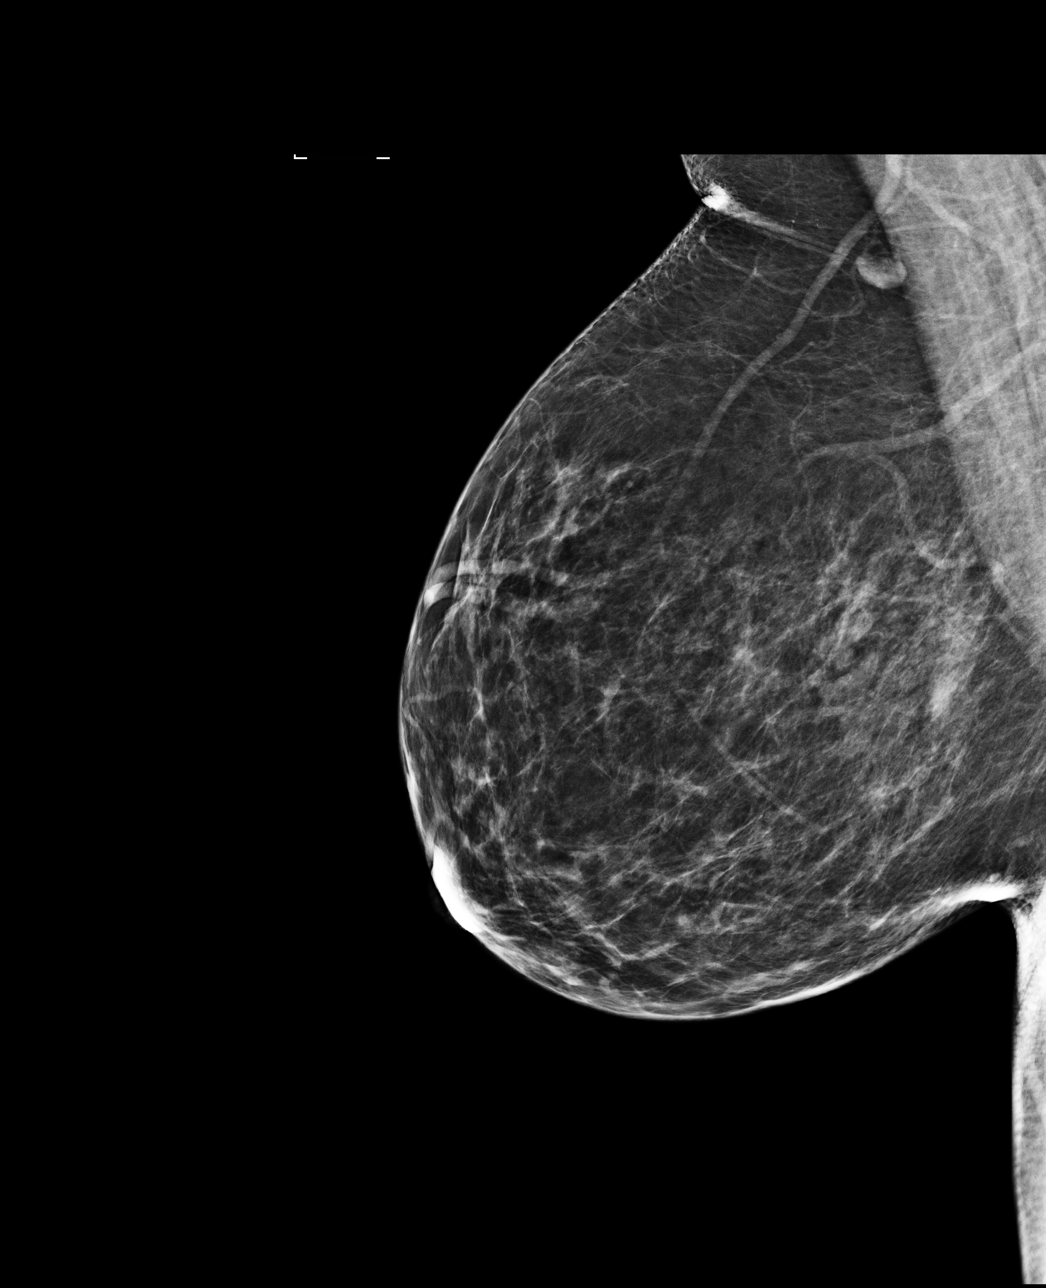

[L CC]
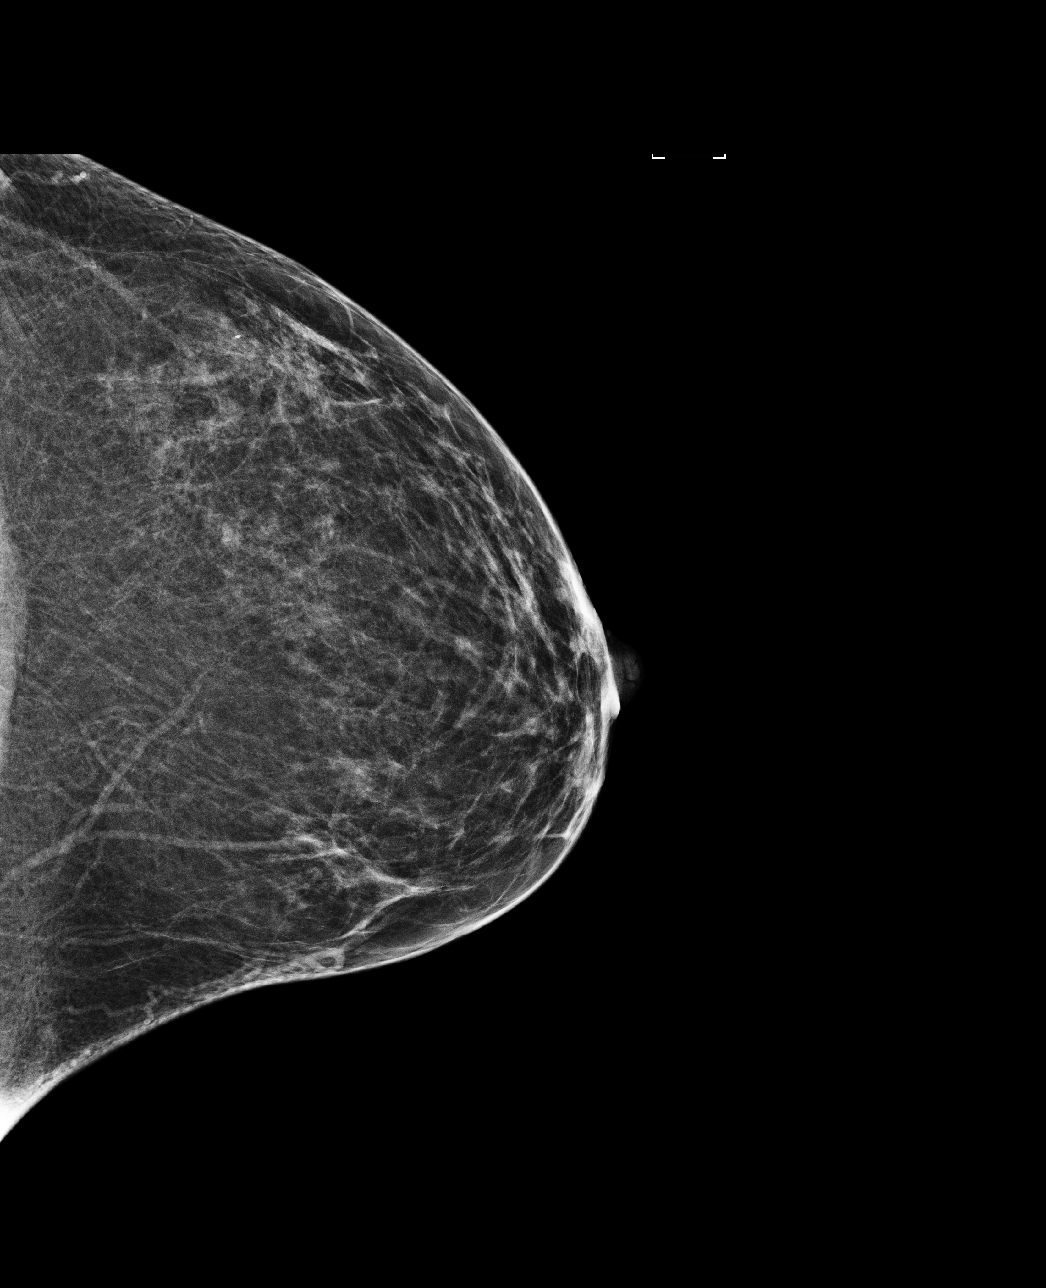

[R CC]
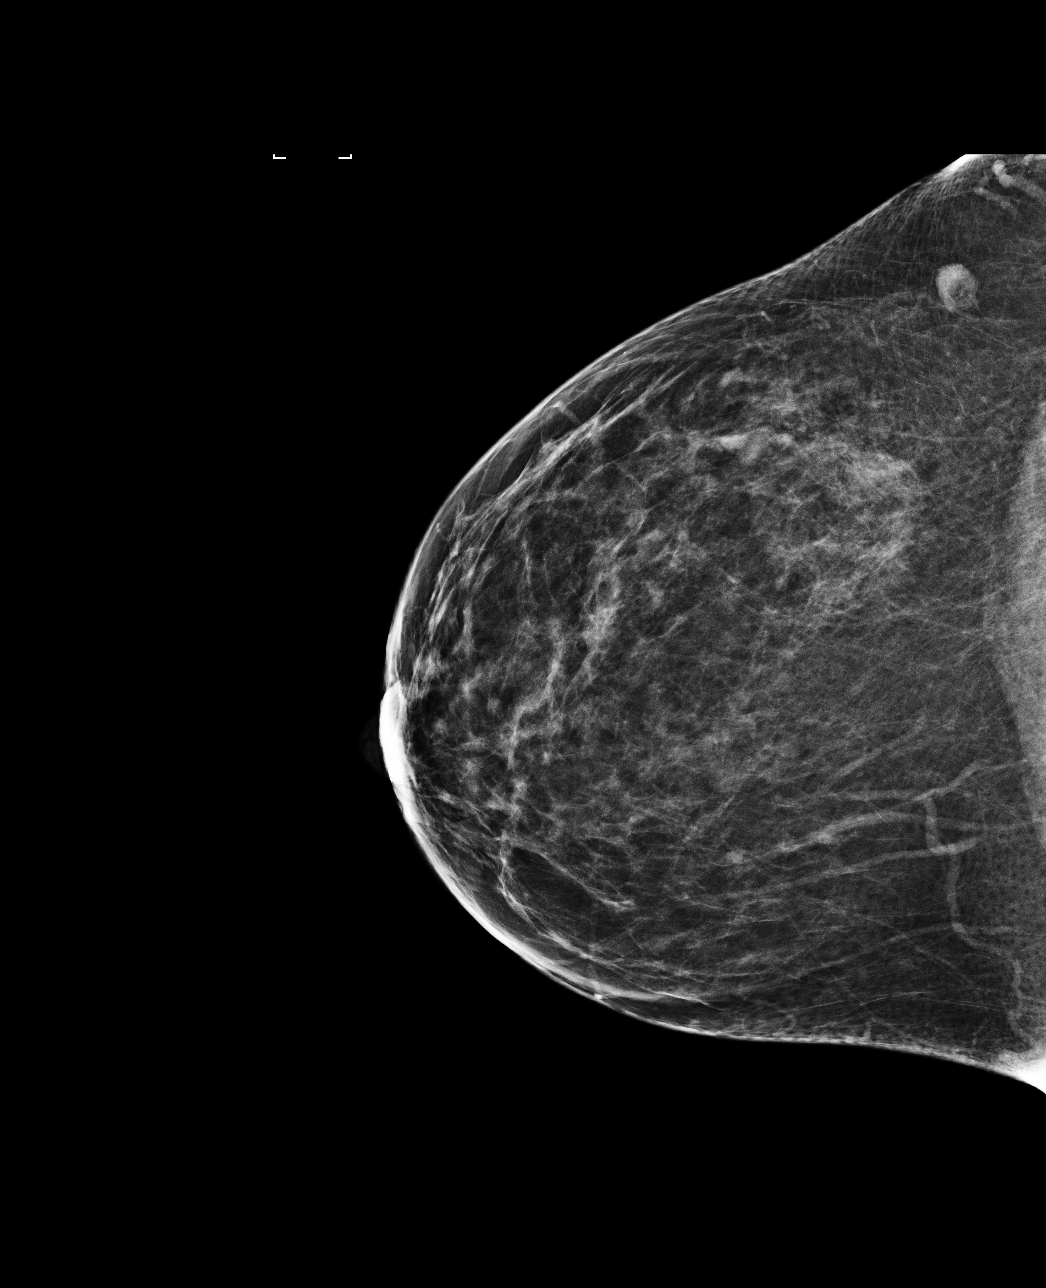

[L MLO]
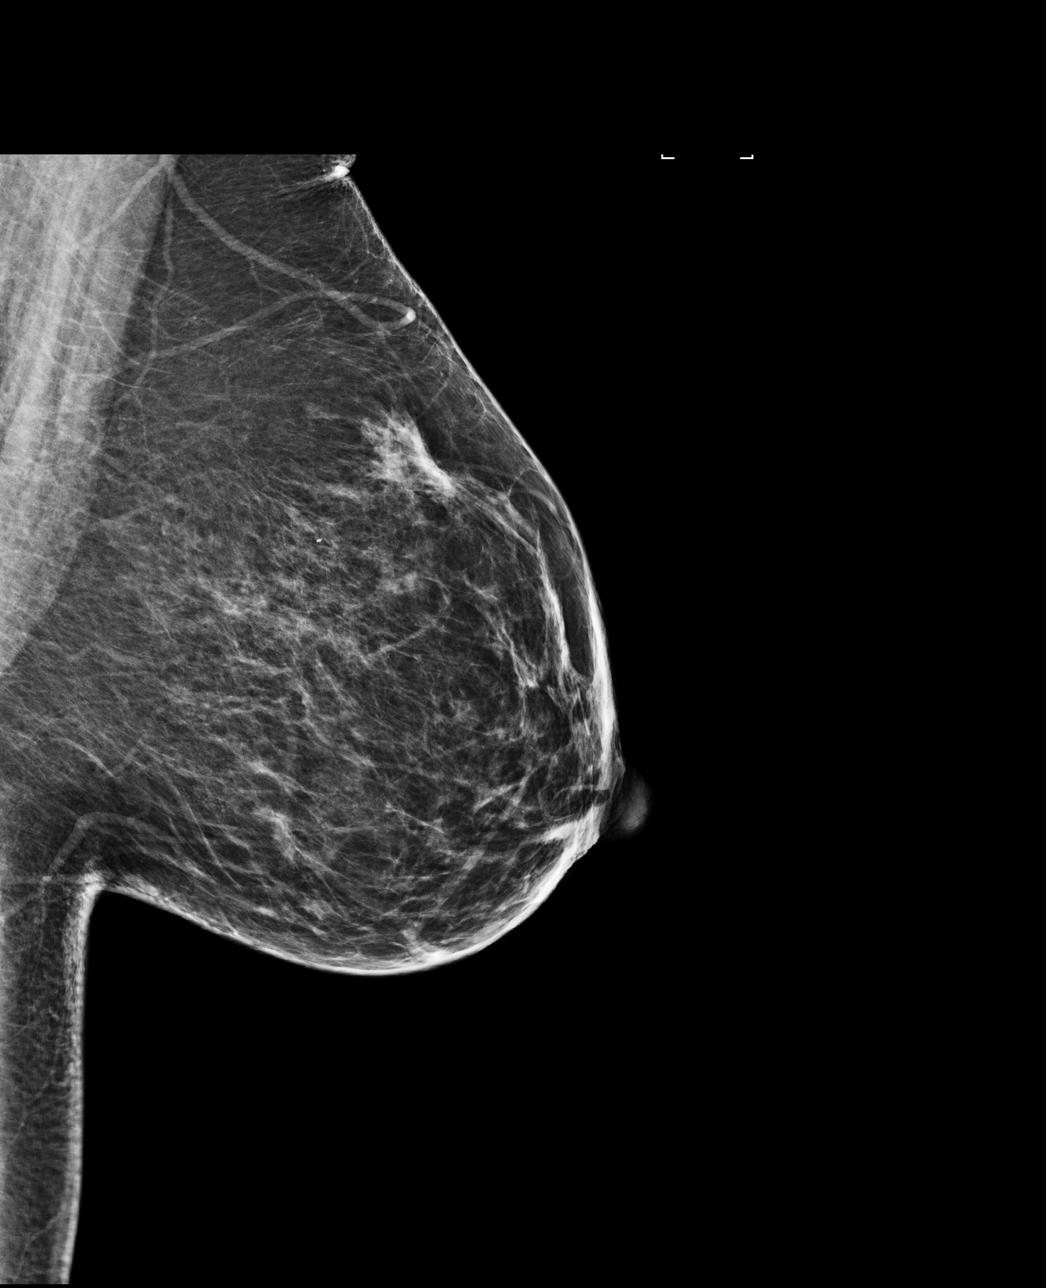

[4 of 4 positions shown; findings below may reference images not displayed]

ACR Breast Density Category b: There are scattered areas of
fibroglandular density.
FINDINGS: There are no findings suspicious for malignancy. Images were
processed with CAD.
IMPRESSION: No mammographic evidence of malignancy. A result letter of this
screening mammogram will be mailed directly to the patient.

RECOMMENDATION:
Screening mammogram in one year. (Code:AS-G-LCT)

BI-RADS CATEGORY  1: Negative.

## 2017-11-23 DIAGNOSIS — F4323 Adjustment disorder with mixed anxiety and depressed mood: Secondary | ICD-10-CM | POA: Diagnosis not present

## 2017-12-07 DIAGNOSIS — F4323 Adjustment disorder with mixed anxiety and depressed mood: Secondary | ICD-10-CM | POA: Diagnosis not present

## 2017-12-21 DIAGNOSIS — F4323 Adjustment disorder with mixed anxiety and depressed mood: Secondary | ICD-10-CM | POA: Diagnosis not present

## 2017-12-28 DIAGNOSIS — F4323 Adjustment disorder with mixed anxiety and depressed mood: Secondary | ICD-10-CM | POA: Diagnosis not present

## 2018-01-05 ENCOUNTER — Encounter: Payer: Self-pay | Admitting: Internal Medicine

## 2018-01-07 ENCOUNTER — Ambulatory Visit: Payer: BLUE CROSS/BLUE SHIELD | Admitting: Family Medicine

## 2018-01-07 ENCOUNTER — Other Ambulatory Visit (HOSPITAL_COMMUNITY): Admit: 2018-01-07 | Payer: Self-pay

## 2018-01-07 ENCOUNTER — Other Ambulatory Visit (HOSPITAL_COMMUNITY)
Admit: 2018-01-07 | Discharge: 2018-01-07 | Disposition: A | Discharge status: Home / Self Care | Payer: BLUE CROSS/BLUE SHIELD | Source: Other Acute Inpatient Hospital | Attending: Family Medicine | Admitting: Family Medicine

## 2018-01-07 ENCOUNTER — Encounter: Payer: Self-pay | Admitting: Family Medicine

## 2018-01-07 VITALS — BP 116/62 | HR 74 | Temp 98.4°F | Ht 67.0 in | Wt 223.0 lb

## 2018-01-07 DIAGNOSIS — E559 Vitamin D deficiency, unspecified: Secondary | ICD-10-CM

## 2018-01-07 DIAGNOSIS — R319 Hematuria, unspecified: Secondary | ICD-10-CM | POA: Insufficient documentation

## 2018-01-07 DIAGNOSIS — R1032 Left lower quadrant pain: Secondary | ICD-10-CM | POA: Diagnosis not present

## 2018-01-07 LAB — COMPREHENSIVE METABOLIC PANEL
ALBUMIN: 3.8 g/dL (ref 3.5–5.0)
ALK PHOS: 55 U/L (ref 38–126)
ALT: 8 U/L — AB (ref 14–54)
ANION GAP: 9 (ref 5–15)
AST: 9 U/L — AB (ref 15–41)
BILIRUBIN TOTAL: 1 mg/dL (ref 0.3–1.2)
BUN: 14 mg/dL (ref 6–20)
CALCIUM: 9.1 mg/dL (ref 8.9–10.3)
CO2: 23 mmol/L (ref 22–32)
CREATININE: 0.72 mg/dL (ref 0.44–1.00)
Chloride: 108 mmol/L (ref 101–111)
GFR calc Af Amer: >60 mL/min (ref >60)
GFR calc non Af Amer: >60 mL/min (ref >60)
GLUCOSE: 87 mg/dL (ref 65–99)
Potassium: 4.1 mmol/L (ref 3.5–5.1)
Sodium: 140 mmol/L (ref 135–145)
Total Protein: 7.7 g/dL (ref 6.5–8.1)

## 2018-01-07 LAB — POC URINALSYSI DIPSTICK (AUTOMATED)
Leukocytes, UA: NEGATIVE
PH UA: 6 (ref 5.0–8.0)
Spec Grav, UA: 1.025 (ref 1.010–1.025)
Urobilinogen, UA: NEGATIVE E.U./dL — AB

## 2018-01-07 LAB — CBC WITH DIFFERENTIAL/PLATELET
BASOS ABS: 0 10*3/uL (ref 0.0–0.1)
BASOS PCT: 0 %
EOS ABS: 0.3 10*3/uL (ref 0.0–0.7)
EOS PCT: 3 %
HEMATOCRIT: 39.5 % (ref 36.0–46.0)
Hemoglobin: 13.6 g/dL (ref 12.0–15.0)
Lymphocytes Relative: 20 %
Lymphs Abs: 1.9 10*3/uL (ref 0.7–4.0)
MCH: 32.5 pg (ref 26.0–34.0)
MCHC: 34.4 g/dL (ref 30.0–36.0)
MCV: 94.3 fL (ref 78.0–100.0)
MONO ABS: 0.9 10*3/uL (ref 0.1–1.0)
MONOS PCT: 9 %
NEUTROS ABS: 6.5 10*3/uL (ref 1.7–7.7)
Neutrophils Relative %: 68 %
PLATELETS: 282 10*3/uL (ref 150–400)
RBC: 4.19 MIL/uL (ref 3.87–5.11)
RDW: 12.7 % (ref 11.5–15.5)
WBC: 9.5 10*3/uL (ref 4.0–10.5)

## 2018-01-07 LAB — URINALYSIS, ROUTINE W REFLEX MICROSCOPIC
BILIRUBIN URINE: NEGATIVE
Glucose, UA: NEGATIVE mg/dL
Ketones, ur: NEGATIVE mg/dL
LEUKOCYTES UA: NEGATIVE
Nitrite: NEGATIVE
PH: 6 (ref 5.0–8.0)
Protein, ur: NEGATIVE mg/dL
SPECIFIC GRAVITY, URINE: 1.021 (ref 1.005–1.030)

## 2018-01-07 LAB — LIPASE, BLOOD: Lipase: 37 U/L (ref 11–51)

## 2018-01-07 MED ORDER — CIPROFLOXACIN HCL 500 MG PO TABS: 500.0000 mg | ORAL_TABLET | Freq: Two times a day (BID) | ORAL | 0 refills | Status: DC

## 2018-01-07 MED ORDER — METRONIDAZOLE 500 MG PO TABS: 500.0000 mg | ORAL_TABLET | Freq: Three times a day (TID) | ORAL | 0 refills | Status: DC

## 2018-01-07 NOTE — Assessment & Plan Note (Addendum)
Typical symptoms of diverticulitis. No acute abdomen on exam today. Check UA, labs today (sent to hospital for labs). Rx cipro and flagyl course - reviewed avoidance of alcohol and tendon rupture risk.  rec clear liquids x 2 days then bland low fiber diet.  Update if not improving with treatment.  Red flags to seek ER care reviewed - reviewed possible complications of diverticulitis.

## 2018-01-07 NOTE — Addendum Note (Signed)
Addended by: Scarlett Presto on: 01/07/2018 10:16 AM   Modules accepted: Orders

## 2018-01-07 NOTE — Addendum Note (Signed)
Addended by: Eustaquio Boyden on: 01/07/2018 10:24 AM   Modules accepted: Orders

## 2018-01-07 NOTE — Assessment & Plan Note (Signed)
Dipstick with 1+ blood, unable to spin in Saturday clinic. I have sent microscopy to lab.  If truly +, consider recurrent nephrolithiasis as cause.

## 2018-01-07 NOTE — Progress Notes (Addendum)
BP 116/62 (BP Location: Left Arm, Patient Position: Sitting, Cuff Size: Large)   Pulse 74   Temp 98.4 F (36.9 C) (Oral)   Ht  (1.702 m)   Wt 223 lb (101.2 kg)   SpO2 99%   BMI 34.93 kg/m    CC: LLQ abd pain Subjective:    Patient ID: Lori Huber, female    DOB: May 26, 1976, 42 y.o.   MRN: [id number]  HPI: Lori Huber is a 42 y.o. female presenting on 01/07/2018 for Abdominal Pain (started a few days ago, diarrhea on Wed, had fever and chills, lower left side)   5d h/o LLQ pain worse after meals. Excruciating pain Wednesday night associated with fevers/chills, diarrhea. Persistent LLQ pain. Constant pain. Sneezing, coughing worsens pain.   Associated with nausea, no vomiting. No blood in stool. No urinary changes.  No h/o diverticulitis.   H/o kidney stones - this feels different (no back pain).  S/p cholecystectomy  IUD in place since 2016.  LMP - spotting 10 days ago (about normal for periods, irregular with IUD), again once yesterday.   Requests vit D check.  Relevant past medical, surgical, family and social history reviewed and updated as indicated. Interim medical history since our last visit reviewed. Allergies and medications reviewed and updated. Outpatient Medications Prior to Visit  Medication Sig Dispense Refill  . ibuprofen (ADVIL,MOTRIN) 200 MG tablet Take 200 mg by mouth as needed.    Marland Kitchen levonorgestrel (MIRENA) 20 MCG/24HR IUD 1 each by Intrauterine route once.    Marland Kitchen LORazepam (ATIVAN) 0.5 MG tablet Take 1 tablet (0.5 mg total) by mouth 2 (two) times daily as needed for anxiety. 15 tablet 0   Facility-Administered Medications Prior to Visit  Medication Dose Route Frequency Provider Last Rate Last Dose  . cyanocobalamin ((VITAMIN B-12)) injection 1,000 mcg  1,000 mcg Intramuscular Q30 days Lorre Munroe, NP   1,000 mcg at 03/30/17 1557     Per HPI unless specifically indicated in ROS section below Review of Systems     Objective:      BP 116/62 (BP Location: Left Arm, Patient Position: Sitting, Cuff Size: Large)   Pulse 74   Temp 98.4 F (36.9 C) (Oral)   Ht  (1.702 m)   Wt 223 lb (101.2 kg)   SpO2 99%   BMI 34.93 kg/m   Wt Readings from Last 3 Encounters:  01/07/18 223 lb (101.2 kg)  08/30/17 220 lb (99.8 kg)  04/05/17 236 lb (107 kg)    Physical Exam  Constitutional: She appears well-developed and well-nourished. No distress.  HENT:  Head: Normocephalic and atraumatic.  Mouth/Throat: Oropharynx is clear and moist. No oropharyngeal exudate.  Cardiovascular: Normal rate, regular rhythm and normal heart sounds.  No murmur heard. Pulmonary/Chest: Effort normal and breath sounds normal. No respiratory distress. She has no wheezes. She has no rhonchi. She has no rales.  Abdominal: Soft. Normal appearance and bowel sounds are normal. There is no hepatosplenomegaly. There is tenderness (moderate) in the left lower quadrant. There is no rigidity, no rebound, no guarding, no CVA tenderness and negative Murphy's sign. No hernia.  Skin: Skin is warm.  Nursing note and vitals reviewed.  Results for orders placed or performed in visit on 01/07/18  POCT Urinalysis Dipstick (Automated)  Result Value Ref Range   Color, UA yellow    Clarity, UA couldy    Glucose, UA -    Bilirubin, UA -    Ketones, UA -  Spec Grav, UA 1.025 1.010 - 1.025   Blood, UA 1+    pH, UA 6.0 5.0 - 8.0   Protein, UA -    Urobilinogen, UA negative (A) 0.2 or 1.0 E.U./dL   Nitrite, UA -    Leukocytes, UA Negative Negative   Lab Results  Component Value Date   WBC 9.5 01/07/2018   HGB 13.6 01/07/2018   HCT 39.5 01/07/2018   MCV 94.3 01/07/2018   PLT 282 01/07/2018    Lab Results  Component Value Date   ALT 8 (L) 01/07/2018   AST 9 (L) 01/07/2018   ALKPHOS 55 01/07/2018   BILITOT 1.0 01/07/2018    Lab Results  Component Value Date   CREATININE 0.72 01/07/2018   BUN 14 01/07/2018   NA 140 01/07/2018   K 4.1 01/07/2018    CL 108 01/07/2018   CO2 23 01/07/2018       Assessment & Plan:   Problem List Items Addressed This Visit    Hematuria    Dipstick with 1+ blood, unable to spin in Saturday clinic. I have sent microscopy to lab.  If truly +, consider recurrent nephrolithiasis as cause.       Relevant Orders   Urinalysis, microscopic only   LLQ abdominal pain - Primary    Typical symptoms of diverticulitis. No acute abdomen on exam today. Check UA, labs today (sent to hospital for labs). Rx cipro and flagyl course - reviewed avoidance of alcohol and tendon rupture risk.  rec clear liquids x 2 days then bland low fiber diet.  Update if not improving with treatment.  Red flags to seek ER care reviewed - reviewed possible complications of diverticulitis.      Relevant Orders   Comprehensive metabolic panel   CBC with Differential/Platelet   Lipase   POCT Urinalysis Dipstick (Automated) (Completed)   Vitamin D deficiency    Requests this checked.       Relevant Orders   VITAMIN D 25 Hydroxy (Vit-D Deficiency, Fractures)       Meds ordered this encounter  Medications  . ciprofloxacin (CIPRO) 500 MG tablet    Sig: Take 1 tablet (500 mg total) by mouth 2 (two) times daily.    Dispense:  20 tablet    Refill:  0  . metroNIDAZOLE (FLAGYL) 500 MG tablet    Sig: Take 1 tablet (500 mg total) by mouth 3 (three) times daily.    Dispense:  30 tablet    Refill:  0   Orders Placed This Encounter  Procedures  . Comprehensive metabolic panel  . CBC with Differential/Platelet  . VITAMIN D 25 Hydroxy (Vit-D Deficiency, Fractures)  . Lipase  . Urinalysis, microscopic only  . POCT Urinalysis Dipstick (Automated)    Follow up plan: No follow-ups on file.  Eustaquio Boyden, MD

## 2018-01-07 NOTE — Patient Instructions (Signed)
Diverticulitis °Diverticulitis is infection or inflammation of small pouches (diverticula) in the colon that form due to a condition called diverticulosis. Diverticula can trap stool (feces) and bacteria, causing infection and inflammation. °Diverticulitis may cause severe stomach pain and diarrhea. It may lead to tissue damage in the colon that causes bleeding. The diverticula may also burst (rupture) and cause infected stool to enter other areas of the abdomen. °Complications of diverticulitis can include: °· Bleeding. °· Severe infection. °· Severe pain. °· Rupture (perforation) of the colon. °· Blockage (obstruction) of the colon. ° °What are the causes? °This condition is caused by stool becoming trapped in the diverticula, which allows bacteria to grow in the diverticula. This leads to inflammation and infection. °What increases the risk? °You are more likely to develop this condition if: °· You have diverticulosis. The risk for diverticulosis increases if: °? You are overweight or obese. °? You use tobacco products. °? You do not get enough exercise. °· You eat a diet that does not include enough fiber. High-fiber foods include fruits, vegetables, beans, nuts, and whole grains. ° °What are the signs or symptoms? °Symptoms of this condition may include: °· Pain and tenderness in the abdomen. The pain is normally located on the left side of the abdomen, but it may occur in other areas. °· Fever and chills. °· Bloating. °· Cramping. °· Nausea. °· Vomiting. °· Changes in bowel routines. °· Blood in your stool. ° °How is this diagnosed? °This condition is diagnosed based on: °· Your medical history. °· A physical exam. °· Tests to make sure there is nothing else causing your condition. These tests may include: °? Blood tests. °? Urine tests. °? Imaging tests of the abdomen, including X-rays, ultrasounds, MRIs, or CT scans. ° °How is this treated? °Most cases of this condition are mild and can be treated at home.  Treatment may include: °· Taking over-the-counter pain medicines. °· Following a clear liquid diet. °· Taking antibiotic medicines by mouth. °· Rest. ° °More severe cases may need to be treated at a hospital. Treatment may include: °· Not eating or drinking. °· Taking prescription pain medicine. °· Receiving antibiotic medicines through an IV tube. °· Receiving fluids and nutrition through an IV tube. °· Surgery. ° °When your condition is under control, your health care provider may recommend that you have a colonoscopy. This is an exam to look at the entire large intestine. During the exam, a lubricated, bendable tube is inserted into the anus and then passed into the rectum, colon, and other parts of the large intestine. A colonoscopy can show how severe your diverticula are and whether something else may be causing your symptoms. °Follow these instructions at home: °Medicines °· Take over-the-counter and prescription medicines only as told by your health care provider. These include fiber supplements, probiotics, and stool softeners. °· If you were prescribed an antibiotic medicine, take it as told by your health care provider. Do not stop taking the antibiotic even if you start to feel better. °· Do not drive or use heavy machinery while taking prescription pain medicine. °General instructions °· Follow a full liquid diet or another diet as directed by your health care provider. After your symptoms improve, your health care provider may tell you to change your diet. He or she may recommend that you eat a diet that contains at least 25 g (25 grams) of fiber daily. Fiber makes it easier to pass stool. Healthy sources of fiber include: °? Berries. One cup   contains 4-8 grams of fiber. °? Beans or lentils. One half cup contains 5-8 grams of fiber. °? Green vegetables. One cup contains 4 grams of fiber. °· Exercise for at least 30 minutes, 3 times each week. You should exercise hard enough to raise your heart rate and  break a sweat. °· Keep all follow-up visits as told by your health care provider. This is important. You may need a colonoscopy. °Contact a health care provider if: °· Your pain does not improve. °· You have a hard time drinking or eating food. °· Your bowel movements do not return to normal. °Get help right away if: °· Your pain gets worse. °· Your symptoms do not get better with treatment. °· Your symptoms suddenly get worse. °· You have a fever. °· You vomit more than one time. °· You have stools that are bloody, black, or tarry. °Summary °· Diverticulitis is infection or inflammation of small pouches (diverticula) in the colon that form due to a condition called diverticulosis. Diverticula can trap stool (feces) and bacteria, causing infection and inflammation. °· You are at higher risk for this condition if you have diverticulosis and you eat a diet that does not include enough fiber. °· Most cases of this condition are mild and can be treated at home. More severe cases may need to be treated at a hospital. °· When your condition is under control, your health care provider may recommend that you have an exam called a colonoscopy. This exam can show how severe your diverticula are and whether something else may be causing your symptoms. °This information is not intended to replace advice given to you by your health care provider. Make sure you discuss any questions you have with your health care provider. °Document Released: 06/09/2005 Document Revised: 10/02/2016 Document Reviewed: 10/02/2016 °Elsevier Interactive Patient Education © 2018 [employer] ° °

## 2018-01-07 NOTE — Assessment & Plan Note (Signed)
Requests this checked.

## 2018-01-09 ENCOUNTER — Encounter: Payer: Self-pay | Admitting: Internal Medicine

## 2018-01-09 LAB — VITAMIN D 25 HYDROXY (VIT D DEFICIENCY, FRACTURES): VIT D 25 HYDROXY: 20.4 ng/mL — AB (ref 30.0–100.0)

## 2018-01-11 ENCOUNTER — Encounter: Payer: Self-pay | Admitting: Internal Medicine

## 2018-01-11 ENCOUNTER — Ambulatory Visit: Payer: BLUE CROSS/BLUE SHIELD | Admitting: Internal Medicine

## 2018-01-11 VITALS — BP 118/80 | HR 84 | Temp 98.3°F | Wt 223.0 lb

## 2018-01-11 DIAGNOSIS — R1012 Left upper quadrant pain: Secondary | ICD-10-CM

## 2018-01-11 DIAGNOSIS — R1032 Left lower quadrant pain: Secondary | ICD-10-CM | POA: Diagnosis not present

## 2018-01-11 DIAGNOSIS — K5900 Constipation, unspecified: Secondary | ICD-10-CM

## 2018-01-11 DIAGNOSIS — R110 Nausea: Secondary | ICD-10-CM

## 2018-01-11 LAB — POC URINALSYSI DIPSTICK (AUTOMATED)
Bilirubin, UA: NEGATIVE
GLUCOSE UA: NEGATIVE
KETONES UA: NEGATIVE
Leukocytes, UA: NEGATIVE
Nitrite, UA: NEGATIVE
Protein, UA: NEGATIVE
UROBILINOGEN UA: 0.2 U/dL
pH, UA: 6 (ref 5.0–8.0)

## 2018-01-11 NOTE — Patient Instructions (Signed)

## 2018-01-11 NOTE — Progress Notes (Signed)
Subjective:    Patient ID: Lori Huber, female    DOB: 1976/06/02, 42 y.o.   MRN: [id number]  HPI  Patient presents to the clinic today to follow.  She reports this started about 10 days ago.  She described the pain is sharp and crampy.  She reports associated loose stools, fever and chills.  The pain seemed worse after eating, worse with pressure, and worse as the day progressed.  She saw Dr. Sharen Hones on 427 for the same.  Urinalysis at that time showed 1+ blood.She does have a history of kidney stones, but reports this feels different.  No urine culture was done.  She was diagnosed with possible diverticulitis and treated with Cipro and Flagyl.  She was advised to consume a clear liquid diet and advance as tolerated.  She reports shortly after taking Cipro and Flagyl it made her sick to her stomach, so she stopped taking them. She did try to just restart the Cipro, but reports it caused numbness and tingling in her hands, so she stopped taking it. Since that time, she reports some nausea but no vomiting. She has had some improvement in her LLQ pain. She has started having some left upper quadrant pain that radiates to her left flank. She is a little constipated, but no loose stools. She denies urinary or vaginal complaints.  CT scan from 2011 reviewed, no noted diverticulosis on exam.  She has not had a colonoscopy. She is s/p cholecystectomy.  Review of Systems  Past Medical History:  Diagnosis Date  . Anxiety   . Vitamin D deficiency     Current Outpatient Medications  Medication Sig Dispense Refill  . ciprofloxacin (CIPRO) 500 MG tablet Take 1 tablet (500 mg total) by mouth 2 (two) times daily. 20 tablet 0  . ibuprofen (ADVIL,MOTRIN) 200 MG tablet Take 200 mg by mouth as needed.    Marland Kitchen levonorgestrel (MIRENA) 20 MCG/24HR IUD 1 each by Intrauterine route once.    Marland Kitchen LORazepam (ATIVAN) 0.5 MG tablet Take 1 tablet (0.5 mg total) by mouth 2 (two) times daily as needed for anxiety.  15 tablet 0  . metroNIDAZOLE (FLAGYL) 500 MG tablet Take 1 tablet (500 mg total) by mouth 3 (three) times daily. 30 tablet 0   Current Facility-Administered Medications  Medication Dose Route Frequency Provider Last Rate Last Dose  . cyanocobalamin ((VITAMIN B-12)) injection 1,000 mcg  1,000 mcg Intramuscular Q30 days Lorre Munroe, NP   1,000 mcg at 03/30/17 1557    Allergies  Allergen Reactions  . Zoloft [Sertraline] Other (See Comments)    Chest pain, nausea, vomiting    Family History  Problem Relation Age of Onset  . Hypertension Father   . Hypertension Brother   . Breast cancer Brother   . Hypertension Paternal Grandmother   . Stroke Paternal Grandmother   . Heart disease Paternal Grandmother   . Diabetes Paternal Grandmother   . Cancer Neg Hx     Social History   Socioeconomic History  . Marital status: Married    Spouse name: Not on file  . Number of children: Not on file  . Years of education: Not on file  . Highest education level: Not on file  Occupational History  . Not on file  Social Needs  . Financial resource strain: Not on file  . Food insecurity:    Worry: Not on file    Inability: Not on file  . Transportation needs:    Medical: Not  on file    Non-medical: Not on file  Tobacco Use  . Smoking status: Never Smoker  . Smokeless tobacco: Never Used  Substance and Sexual Activity  . Alcohol use: No    Alcohol/week: 4.2 oz    Types: 7 Glasses of wine per week    Comment: occasional  . Drug use: No  . Sexual activity: Yes    Birth control/protection: IUD  Lifestyle  . Physical activity:    Days per week: Not on file    Minutes per session: Not on file  . Stress: Not on file  Relationships  . Social connections:    Talks on phone: Not on file    Gets together: Not on file    Attends religious service: Not on file    Active member of club or organization: Not on file    Attends meetings of clubs or organizations: Not on file     Relationship status: Not on file  . Intimate partner violence:    Fear of current or ex partner: Not on file    Emotionally abused: Not on file    Physically abused: Not on file    Forced sexual activity: Not on file  Other Topics Concern  . Not on file  Social History Narrative  . Not on file     Constitutional: Denies fever, malaise, fatigue, headache or abrupt weight changes.  Respiratory: Denies difficulty breathing, shortness of breath, cough or sputum production.   Cardiovascular: Denies chest pain, chest tightness, palpitations or swelling in the hands or feet.  Gastrointestinal: Pt reports LUQ, LLQ abdominal pain and constipation. Denies bloating,  diarrhea or blood in the stool.  GU: Denies urgency, frequency, pain with urination, burning sensation, blood in urine, odor or discharge. Neurological: Denies dizziness, difficulty with memory, difficulty with speech or problems with balance and coordination.    No other specific complaints in a complete review of systems (except as listed in HPI above).     Objective:   Physical Exam   BP 118/80   Pulse 84   Temp 98.3 F (36.8 C) (Oral)   Wt 223 lb (101.2 kg)   SpO2 98%   BMI 34.93 kg/m  Wt Readings from Last 3 Encounters:  01/11/18 223 lb (101.2 kg)  01/07/18 223 lb (101.2 kg)  08/30/17 220 lb (99.8 kg)    General: Appears her stated age, obese in NAD. Skin: Warm, dry and intact. No rashes noted. Cardiovascular: Normal rate and rhythm.  Pulmonary/Chest: Normal effort and positive vesicular breath sounds. No respiratory distress. No wheezes, rales or ronchi noted.  Abdomen: Soft and mildly tender in the LUQ, LLQ. Normal bowel sounds. No distention or masses noted. No CVA tenderness noted Neurological: Alert and oriented.    BMET    Component Value Date/Time   NA 140 01/07/2018 1233   K 4.1 01/07/2018 1233   CL 108 01/07/2018 1233   CO2 23 01/07/2018 1233   GLUCOSE 87 01/07/2018 1233   BUN 14 01/07/2018  1233   CREATININE 0.72 01/07/2018 1233   CALCIUM 9.1 01/07/2018 1233   GFRNONAA >60 01/07/2018 1233   GFRAA >60 01/07/2018 1233    Lipid Panel     Component Value Date/Time   CHOL 172 11/26/2013 1421   TRIG 118.0 11/26/2013 1421   HDL 39.80 11/26/2013 1421   CHOLHDL 4 11/26/2013 1421   VLDL 23.6 11/26/2013 1421   LDLCALC 109 (H) 11/26/2013 1421    CBC    Component Value  Date/Time   WBC 9.5 01/07/2018 1233   RBC 4.19 01/07/2018 1233   HGB 13.6 01/07/2018 1233   HCT 39.5 01/07/2018 1233   PLT 282 01/07/2018 1233   MCV 94.3 01/07/2018 1233   MCH 32.5 01/07/2018 1233   MCHC 34.4 01/07/2018 1233   RDW 12.7 01/07/2018 1233   LYMPHSABS 1.9 01/07/2018 1233   MONOABS 0.9 01/07/2018 1233   EOSABS 0.3 01/07/2018 1233   BASOSABS 0.0 01/07/2018 1233    Hgb A1C Lab Results  Component Value Date   HGBA1C 5.3 10/05/2016           Assessment & Plan:   LUQ, LLQ Abdominal Pain, Nausea and Constipation:  She could not tolerate Cipro and Flagyl Symptoms slightly improved but unresolved Repeat urinalysis: trace blood Will send urine culture Will obtain CT abdomen with contrast  Will follow up after labs, return precautions discussed Nicki Reaper, NP

## 2018-01-12 LAB — URINE CULTURE
MICRO NUMBER:: 90531626
SPECIMEN QUALITY: ADEQUATE

## 2018-01-13 ENCOUNTER — Ambulatory Visit
Admission: RE | Admit: 2018-01-13 | Discharge: 2018-01-13 | Disposition: A | Discharge status: Home / Self Care | Payer: BLUE CROSS/BLUE SHIELD | Source: Ambulatory Visit | Attending: Internal Medicine | Admitting: Internal Medicine

## 2018-01-13 DIAGNOSIS — R1012 Left upper quadrant pain: Secondary | ICD-10-CM

## 2018-01-13 DIAGNOSIS — K5732 Diverticulitis of large intestine without perforation or abscess without bleeding: Secondary | ICD-10-CM | POA: Insufficient documentation

## 2018-01-13 DIAGNOSIS — R1110 Vomiting, unspecified: Secondary | ICD-10-CM | POA: Diagnosis not present

## 2018-01-13 DIAGNOSIS — R197 Diarrhea, unspecified: Secondary | ICD-10-CM | POA: Diagnosis not present

## 2018-01-13 DIAGNOSIS — R109 Unspecified abdominal pain: Secondary | ICD-10-CM | POA: Diagnosis not present

## 2018-01-13 MED ORDER — IOPAMIDOL (ISOVUE-300) INJECTION 61%
100.0000 mL | Freq: Once | INTRAVENOUS | Status: AC | PRN
  Administered 2018-01-13: 100 mL via INTRAVENOUS

## 2018-01-16 ENCOUNTER — Encounter: Payer: Self-pay | Admitting: Internal Medicine

## 2018-01-18 DIAGNOSIS — F4323 Adjustment disorder with mixed anxiety and depressed mood: Secondary | ICD-10-CM | POA: Diagnosis not present

## 2018-01-25 DIAGNOSIS — F4323 Adjustment disorder with mixed anxiety and depressed mood: Secondary | ICD-10-CM | POA: Diagnosis not present

## 2018-02-14 DIAGNOSIS — F411 Generalized anxiety disorder: Secondary | ICD-10-CM | POA: Diagnosis not present

## 2018-02-21 DIAGNOSIS — D2262 Melanocytic nevi of left upper limb, including shoulder: Secondary | ICD-10-CM | POA: Diagnosis not present

## 2018-02-21 DIAGNOSIS — D2271 Melanocytic nevi of right lower limb, including hip: Secondary | ICD-10-CM | POA: Diagnosis not present

## 2018-02-21 DIAGNOSIS — D225 Melanocytic nevi of trunk: Secondary | ICD-10-CM | POA: Diagnosis not present

## 2018-02-21 DIAGNOSIS — D485 Neoplasm of uncertain behavior of skin: Secondary | ICD-10-CM | POA: Diagnosis not present

## 2018-02-21 DIAGNOSIS — D2261 Melanocytic nevi of right upper limb, including shoulder: Secondary | ICD-10-CM | POA: Diagnosis not present

## 2018-02-22 DIAGNOSIS — F411 Generalized anxiety disorder: Secondary | ICD-10-CM | POA: Diagnosis not present

## 2018-02-28 DIAGNOSIS — F411 Generalized anxiety disorder: Secondary | ICD-10-CM | POA: Diagnosis not present

## 2018-03-08 DIAGNOSIS — F411 Generalized anxiety disorder: Secondary | ICD-10-CM | POA: Diagnosis not present

## 2018-03-15 DIAGNOSIS — F411 Generalized anxiety disorder: Secondary | ICD-10-CM | POA: Diagnosis not present

## 2018-03-22 DIAGNOSIS — F411 Generalized anxiety disorder: Secondary | ICD-10-CM | POA: Diagnosis not present

## 2018-03-23 DIAGNOSIS — F411 Generalized anxiety disorder: Secondary | ICD-10-CM | POA: Diagnosis not present

## 2018-04-18 DIAGNOSIS — F411 Generalized anxiety disorder: Secondary | ICD-10-CM | POA: Diagnosis not present

## 2018-04-26 DIAGNOSIS — F411 Generalized anxiety disorder: Secondary | ICD-10-CM | POA: Diagnosis not present

## 2018-05-03 DIAGNOSIS — F411 Generalized anxiety disorder: Secondary | ICD-10-CM | POA: Diagnosis not present

## 2018-05-08 DIAGNOSIS — F411 Generalized anxiety disorder: Secondary | ICD-10-CM | POA: Diagnosis not present

## 2018-06-12 DIAGNOSIS — F411 Generalized anxiety disorder: Secondary | ICD-10-CM | POA: Diagnosis not present

## 2018-06-29 DIAGNOSIS — F411 Generalized anxiety disorder: Secondary | ICD-10-CM | POA: Diagnosis not present

## 2018-07-03 ENCOUNTER — Other Ambulatory Visit: Payer: Self-pay | Admitting: Internal Medicine

## 2018-07-03 DIAGNOSIS — Z1231 Encounter for screening mammogram for malignant neoplasm of breast: Secondary | ICD-10-CM

## 2018-07-03 DIAGNOSIS — F411 Generalized anxiety disorder: Secondary | ICD-10-CM | POA: Diagnosis not present

## 2018-07-07 ENCOUNTER — Other Ambulatory Visit: Payer: Self-pay | Admitting: Otolaryngology

## 2018-07-07 DIAGNOSIS — E041 Nontoxic single thyroid nodule: Secondary | ICD-10-CM

## 2018-07-07 DIAGNOSIS — H6123 Impacted cerumen, bilateral: Secondary | ICD-10-CM | POA: Diagnosis not present

## 2018-07-10 DIAGNOSIS — F411 Generalized anxiety disorder: Secondary | ICD-10-CM | POA: Diagnosis not present

## 2018-07-12 ENCOUNTER — Encounter: Payer: Self-pay | Admitting: Internal Medicine

## 2018-07-13 ENCOUNTER — Encounter: Payer: Self-pay | Admitting: Obstetrics & Gynecology

## 2018-07-13 ENCOUNTER — Ambulatory Visit (INDEPENDENT_AMBULATORY_CARE_PROVIDER_SITE_OTHER): Payer: BLUE CROSS/BLUE SHIELD | Admitting: Obstetrics & Gynecology

## 2018-07-13 VITALS — BP 110/72 | HR 72 | Wt 242.6 lb

## 2018-07-13 DIAGNOSIS — Z01419 Encounter for gynecological examination (general) (routine) without abnormal findings: Secondary | ICD-10-CM | POA: Diagnosis not present

## 2018-07-13 DIAGNOSIS — Z124 Encounter for screening for malignant neoplasm of cervix: Secondary | ICD-10-CM | POA: Diagnosis not present

## 2018-07-13 DIAGNOSIS — Z113 Encounter for screening for infections with a predominantly sexual mode of transmission: Secondary | ICD-10-CM

## 2018-07-13 DIAGNOSIS — Z1231 Encounter for screening mammogram for malignant neoplasm of breast: Secondary | ICD-10-CM

## 2018-07-13 DIAGNOSIS — Z3202 Encounter for pregnancy test, result negative: Secondary | ICD-10-CM

## 2018-07-13 DIAGNOSIS — N898 Other specified noninflammatory disorders of vagina: Secondary | ICD-10-CM | POA: Diagnosis not present

## 2018-07-13 DIAGNOSIS — N949 Unspecified condition associated with female genital organs and menstrual cycle: Secondary | ICD-10-CM

## 2018-07-13 DIAGNOSIS — N644 Mastodynia: Secondary | ICD-10-CM

## 2018-07-13 DIAGNOSIS — Z1151 Encounter for screening for human papillomavirus (HPV): Secondary | ICD-10-CM | POA: Diagnosis not present

## 2018-07-13 LAB — POCT URINE PREGNANCY: Preg Test, Ur: NEGATIVE

## 2018-07-13 NOTE — Progress Notes (Signed)
GYNECOLOGY ANNUAL PREVENTATIVE CARE ENCOUNTER NOTE  Subjective:   Lori Huber is a 42 y.o. [id number] female here for a routine annual gynecologic exam.  Current complaints: some bilateral breast sensitivity and cervical sensitivity lately, mild but she is concerned about possible pregnancy. No other symptoms.   Denies abnormal vaginal bleeding, discharge, pelvic pain, problems with intercourse or other gynecologic concerns.    Gynecologic History No LMP recorded. (Menstrual status: IUD). Contraception: Mirena IUD placed 2016 Last Pap: 10/29/2016. Results were: normal with negative HPV Last mammogram: 12/03/2016. Results were: normal  Obstetric History OB History  Gravida Para Term Preterm AB Living  1       1 0  SAB TAB Ectopic Multiple Live Births    1          # Outcome Date GA Lbr Len/2nd Weight Sex Delivery Anes PTL Lv  1 TAB 2001            Past Medical History:  Diagnosis Date  . Anxiety   . Vitamin D deficiency     Past Surgical History:  Procedure Laterality Date  . CHOLECYSTECTOMY  2011  . KIDNEY STONE SURGERY  1996  . KNEE SURGERY Left     Current Outpatient Medications on File Prior to Visit  Medication Sig Dispense Refill  . levonorgestrel (MIRENA) 20 MCG/24HR IUD 1 each by Intrauterine route once.    Marland Kitchen LORazepam (ATIVAN) 0.5 MG tablet Take 1 tablet (0.5 mg total) by mouth 2 (two) times daily as needed for anxiety. 15 tablet 0  . ciprofloxacin (CIPRO) 500 MG tablet Take 1 tablet (500 mg total) by mouth 2 (two) times daily. (Patient not taking: Reported on 01/11/2018) 20 tablet 0  . ibuprofen (ADVIL,MOTRIN) 200 MG tablet Take 200 mg by mouth as needed.    . metroNIDAZOLE (FLAGYL) 500 MG tablet Take 1 tablet (500 mg total) by mouth 3 (three) times daily. (Patient not taking: Reported on 01/11/2018) 30 tablet 0   Current Facility-Administered Medications on File Prior to Visit  Medication Dose Route Frequency Provider Last Rate Last Dose  . cyanocobalamin  ((VITAMIN B-12)) injection 1,000 mcg  1,000 mcg Intramuscular Q30 days Lorre Munroe, NP   1,000 mcg at 03/30/17 1557    Allergies  Allergen Reactions  . Zoloft [Sertraline] Other (See Comments)    Chest pain, nausea, vomiting  . Ciprofloxacin Other (See Comments)    Pt states it caused tingling in hands and feet.   . Flagyl [Metronidazole] Nausea And Vomiting    Social History:  reports that she has never smoked. She has never used smokeless tobacco. She reports that she does not drink alcohol or use drugs.  Family History  Problem Relation Age of Onset  . Hypertension Father   . Hypertension Brother   . Breast cancer Brother   . Hypertension Paternal Grandmother   . Stroke Paternal Grandmother   . Heart disease Paternal Grandmother   . Diabetes Paternal Grandmother   . Cancer Neg Hx     The following portions of the patient's history were reviewed and updated as appropriate: allergies, current medications, past family history, past medical history, past social history, past surgical history and problem list.  Review of Systems Pertinent items noted in HPI and remainder of comprehensive ROS otherwise negative.   Objective:  BP 110/72   Pulse 72   Wt 242 lb 9.6 oz (110 kg)   BMI 38.00 kg/m  CONSTITUTIONAL: Well-developed, well-nourished female in no acute distress.  HENT:  Normocephalic, atraumatic, External right and left ear normal. Oropharynx is clear and moist EYES: Conjunctivae and EOM are normal. Pupils are equal, round, and reactive to light. No scleral icterus.  NECK: Normal range of motion, supple, no masses.  Normal thyroid.  SKIN: Skin is warm and dry. No rash noted. Not diaphoretic. No erythema. No pallor. MUSCULOSKELETAL: Normal range of motion. No tenderness.  No cyanosis, clubbing, or edema.  2+ distal pulses. NEUROLOGIC: Alert and oriented to person, place, and time. Normal reflexes, muscle tone coordination. No cranial nerve deficit noted. PSYCHIATRIC:  Normal mood and affect. Normal behavior. Normal judgment and thought content. CARDIOVASCULAR: Normal heart rate noted, regular rhythm RESPIRATORY: Clear to auscultation bilaterally. Effort and breath sounds normal, no problems with respiration noted. BREASTS: Symmetric in size. No masses, skin changes, nipple drainage, or lymphadenopathy. ABDOMEN: Soft, normal bowel sounds, no distention noted.  No tenderness, rebound or guarding.  PELVIC: Normal appearing external genitalia; normal appearing vaginal mucosa and cervix.  Thick, yellow discharge noted in upper vagina, testing sample obtained.  IUD strings visualized. Pap smear obtained.  Normal uterine size, no other palpable masses, no uterine or adnexal tenderness.  Results for orders placed or performed in visit on 07/13/18 (from the past 24 hour(s))  POCT urine pregnancy     Status: Normal   Collection Time: 07/13/18 10:38 AM  Result Value Ref Range   Preg Test, Ur Negative Negative     Assessment and Plan:  1. Vaginal discharge 2. Cervical sensitivity - POCT urine pregnancy negative, patient reassured - Cervicovaginal ancillary only done, will follow up results and manage accordingly.  3. Breast sensitivity 4. Breast cancer screening by mammogram Mammogram ordered, had normal exam today.  - MM 3D SCREEN BREAST BILATERAL; Future  5. Encounter for gynecological examination without abnormal finding - Cytology - PAP Will follow up results of pap smear and manage accordingly. Routine preventative health maintenance measures emphasized. Please refer to After Visit Summary for other counseling recommendations.    Jaynie Collins, MD, FACOG Obstetrician & Gynecologist, [facility] for [employer], [facility]

## 2018-07-13 NOTE — Patient Instructions (Signed)
Preventive Care 40-64 Years, Female Preventive care refers to lifestyle choices and visits with your health care provider that can promote health and wellness. What does preventive care include?  A yearly physical exam. This is also called an annual well check.  Dental exams once or twice a year.  Routine eye exams. Ask your health care provider how often you should have your eyes checked.  Personal lifestyle choices, including: ? Daily care of your teeth and gums. ? Regular physical activity. ? Eating a healthy diet. ? Avoiding tobacco and drug use. ? Limiting alcohol use. ? Practicing safe sex. ? Taking low-dose aspirin daily starting at age 58. ? Taking vitamin and mineral supplements as recommended by your health care provider. What happens during an annual well check? The services and screenings done by your health care provider during your annual well check will depend on your age, overall health, lifestyle risk factors, and family history of disease. Counseling Your health care provider may ask you questions about your:  Alcohol use.  Tobacco use.  Drug use.  Emotional well-being.  Home and relationship well-being.  Sexual activity.  Eating habits.  Work and work Statistician.  Method of birth control.  Menstrual cycle.  Pregnancy history.  Screening You may have the following tests or measurements:  Height, weight, and BMI.  Blood pressure.  Lipid and cholesterol levels. These may be checked every 5 years, or more frequently if you are over 81 years old.  Skin check.  Lung cancer screening. You may have this screening every year starting at age 78 if you have a 30-pack-year history of smoking and currently smoke or have quit within the past 15 years.  Fecal occult blood test (FOBT) of the stool. You may have this test every year starting at age 65.  Flexible sigmoidoscopy or colonoscopy. You may have a sigmoidoscopy every 5 years or a colonoscopy  every 10 years starting at age 30.  Hepatitis C blood test.  Hepatitis B blood test.  Sexually transmitted disease (STD) testing.  Diabetes screening. This is done by checking your blood sugar (glucose) after you have not eaten for a while (fasting). You may have this done every 1-3 years.  Mammogram. This may be done every 1-2 years. Talk to your health care provider about when you should start having regular mammograms. This may depend on whether you have a family history of breast cancer.  BRCA-related cancer screening. This may be done if you have a family history of breast, ovarian, tubal, or peritoneal cancers.  Pelvic exam and Pap test. This may be done every 3 years starting at age 80. Starting at age 36, this may be done every 5 years if you have a Pap test in combination with an HPV test.  Bone density scan. This is done to screen for osteoporosis. You may have this scan if you are at high risk for osteoporosis.  Discuss your test results, treatment options, and if necessary, the need for more tests with your health care provider. Vaccines Your health care provider may recommend certain vaccines, such as:  Influenza vaccine. This is recommended every year.  Tetanus, diphtheria, and acellular pertussis (Tdap, Td) vaccine. You may need a Td booster every 10 years.  Varicella vaccine. You may need this if you have not been vaccinated.  Zoster vaccine. You may need this after age 5.  Measles, mumps, and rubella (MMR) vaccine. You may need at least one dose of MMR if you were born in  1957 or later. You may also need a second dose.  Pneumococcal 13-valent conjugate (PCV13) vaccine. You may need this if you have certain conditions and were not previously vaccinated.  Pneumococcal polysaccharide (PPSV23) vaccine. You may need one or two doses if you smoke cigarettes or if you have certain conditions.  Meningococcal vaccine. You may need this if you have certain  conditions.  Hepatitis A vaccine. You may need this if you have certain conditions or if you travel or work in places where you may be exposed to hepatitis A.  Hepatitis B vaccine. You may need this if you have certain conditions or if you travel or work in places where you may be exposed to hepatitis B.  Haemophilus influenzae type b (Hib) vaccine. You may need this if you have certain conditions.  Talk to your health care provider about which screenings and vaccines you need and how often you need them. This information is not intended to replace advice given to you by your health care provider. Make sure you discuss any questions you have with your health care provider. Document Released: 09/26/2015 Document Revised: 05/19/2016 Document Reviewed: 07/01/2015 Elsevier Interactive Patient Education  2018 Elsevier Inc.  

## 2018-07-14 LAB — CERVICOVAGINAL ANCILLARY ONLY
Bacterial vaginitis: NEGATIVE
CANDIDA VAGINITIS: NEGATIVE
CHLAMYDIA, DNA PROBE: NEGATIVE
NEISSERIA GONORRHEA: NEGATIVE
Trichomonas: NEGATIVE

## 2018-07-17 ENCOUNTER — Encounter: Payer: Self-pay | Admitting: Internal Medicine

## 2018-07-17 ENCOUNTER — Ambulatory Visit: Payer: BLUE CROSS/BLUE SHIELD | Admitting: Internal Medicine

## 2018-07-17 ENCOUNTER — Ambulatory Visit
Admission: RE | Admit: 2018-07-17 | Discharge: 2018-07-17 | Disposition: A | Discharge status: Home / Self Care | Payer: BLUE CROSS/BLUE SHIELD | Source: Ambulatory Visit | Attending: Otolaryngology | Admitting: Otolaryngology

## 2018-07-17 VITALS — BP 112/78 | HR 76 | Temp 98.5°F | Wt 244.0 lb

## 2018-07-17 DIAGNOSIS — E041 Nontoxic single thyroid nodule: Secondary | ICD-10-CM

## 2018-07-17 DIAGNOSIS — R635 Abnormal weight gain: Secondary | ICD-10-CM | POA: Diagnosis not present

## 2018-07-17 DIAGNOSIS — E559 Vitamin D deficiency, unspecified: Secondary | ICD-10-CM | POA: Diagnosis not present

## 2018-07-17 DIAGNOSIS — N644 Mastodynia: Secondary | ICD-10-CM

## 2018-07-17 DIAGNOSIS — Z23 Encounter for immunization: Secondary | ICD-10-CM | POA: Diagnosis not present

## 2018-07-17 DIAGNOSIS — E042 Nontoxic multinodular goiter: Secondary | ICD-10-CM

## 2018-07-17 DIAGNOSIS — E049 Nontoxic goiter, unspecified: Secondary | ICD-10-CM | POA: Diagnosis not present

## 2018-07-17 DIAGNOSIS — E538 Deficiency of other specified B group vitamins: Secondary | ICD-10-CM

## 2018-07-17 DIAGNOSIS — R5383 Other fatigue: Secondary | ICD-10-CM

## 2018-07-17 LAB — VITAMIN D 25 HYDROXY (VIT D DEFICIENCY, FRACTURES): VITD: 21.06 ng/mL — ABNORMAL LOW (ref 30.00–100.00)

## 2018-07-17 LAB — LUTEINIZING HORMONE: LH: 5.41 m[IU]/mL

## 2018-07-17 LAB — CYTOLOGY - PAP
DIAGNOSIS: NEGATIVE
HPV: NOT DETECTED

## 2018-07-17 LAB — HEMOGLOBIN A1C: HEMOGLOBIN A1C: 5.4 % (ref 4.6–6.5)

## 2018-07-17 LAB — T4, FREE: Free T4: 0.63 ng/dL (ref 0.60–1.60)

## 2018-07-17 LAB — TSH: TSH: 2.73 u[IU]/mL (ref 0.35–4.50)

## 2018-07-17 LAB — FOLLICLE STIMULATING HORMONE: FSH: 5.7 m[IU]/mL

## 2018-07-17 LAB — VITAMIN B12: Vitamin B-12: 328 pg/mL (ref 211–911)

## 2018-07-17 NOTE — Progress Notes (Signed)
Subjective:    Patient ID: Lori Huber, female    DOB: 11-10-1975, 42 y.o.   MRN: [id number]  HPI  Pt presents to the clinic today to have repeat labs. She would like Vit D, B12 and thyroid levels done. She has been off B12 injections for about 8 months. She is taking a Vit D and Calcium supplement OTC.   She reports her ENT would also like a followup thyroid ultrasound done to follow up on her thyroid nodules. Her last thyroid ultrasound was 03/2017. She has a repeat scheduled for today. She is following with Dr. Willeen Cass.  She also reports persistent fatigue, weight gain and breast tenderness.  She reports she saw her GYN for the same.  They did a pregnancy test which was negative.  Her GYN advised her she might be peri-menopausal.  She does not have periods because of an IUD.  She last had her female female hormones checked 1 year ago.  Review of Systems      Past Medical History:  Diagnosis Date  . Anxiety   . Vitamin D deficiency     Current Outpatient Medications  Medication Sig Dispense Refill  . ciprofloxacin (CIPRO) 500 MG tablet Take 1 tablet (500 mg total) by mouth 2 (two) times daily. (Patient not taking: Reported on 01/11/2018) 20 tablet 0  . ibuprofen (ADVIL,MOTRIN) 200 MG tablet Take 200 mg by mouth as needed.    Marland Kitchen levonorgestrel (MIRENA) 20 MCG/24HR IUD 1 each by Intrauterine route once.    Marland Kitchen LORazepam (ATIVAN) 0.5 MG tablet Take 1 tablet (0.5 mg total) by mouth 2 (two) times daily as needed for anxiety. 15 tablet 0  . metroNIDAZOLE (FLAGYL) 500 MG tablet Take 1 tablet (500 mg total) by mouth 3 (three) times daily. (Patient not taking: Reported on 01/11/2018) 30 tablet 0   Current Facility-Administered Medications  Medication Dose Route Frequency Provider Last Rate Last Dose  . cyanocobalamin ((VITAMIN B-12)) injection 1,000 mcg  1,000 mcg Intramuscular Q30 days Lorre Munroe, NP   1,000 mcg at 03/30/17 1557    Allergies  Allergen Reactions  . Zoloft  [Sertraline] Other (See Comments)    Chest pain, nausea, vomiting  . Ciprofloxacin Other (See Comments)    Pt states it caused tingling in hands and feet.   . Flagyl [Metronidazole] Nausea And Vomiting    Family History  Problem Relation Age of Onset  . Hypertension Father   . Hypertension Brother   . Breast cancer Brother   . Hypertension Paternal Grandmother   . Stroke Paternal Grandmother   . Heart disease Paternal Grandmother   . Diabetes Paternal Grandmother   . Cancer Neg Hx     Social History   Socioeconomic History  . Marital status: Married    Spouse name: Not on file  . Number of children: Not on file  . Years of education: Not on file  . Highest education level: Not on file  Occupational History  . Not on file  Social Needs  . Financial resource strain: Not on file  . Food insecurity:    Worry: Not on file    Inability: Not on file  . Transportation needs:    Medical: Not on file    Non-medical: Not on file  Tobacco Use  . Smoking status: Never Smoker  . Smokeless tobacco: Never Used  Substance and Sexual Activity  . Alcohol use: No    Alcohol/week: 7.0 standard drinks    Types: 7 Glasses  of wine per week    Comment: occasional  . Drug use: No  . Sexual activity: Yes    Birth control/protection: IUD  Lifestyle  . Physical activity:    Days per week: Not on file    Minutes per session: Not on file  . Stress: Not on file  Relationships  . Social connections:    Talks on phone: Not on file    Gets together: Not on file    Attends religious service: Not on file    Active member of club or organization: Not on file    Attends meetings of clubs or organizations: Not on file    Relationship status: Not on file  . Intimate partner violence:    Fear of current or ex partner: Not on file    Emotionally abused: Not on file    Physically abused: Not on file    Forced sexual activity: Not on file  Other Topics Concern  . Not on file  Social History  Narrative  . Not on file     Constitutional: Pt reports fatigue and weight gain. Denies fever, malaise, headache.  Respiratory:  Denies cough, difficulty breathing, shortness of breath, cough or sputum production.   Cardiovascular: Denies chest pain, chest tightness, palpitations or swelling in the hands or feet.  Neurological: Denies dizziness, difficulty with memory, difficulty with speech or problems with balance and coordination.  Psych: Pt reports anxiety. Denies depression, SI/HI.  No other specific complaints in a complete review of systems (except as listed in HPI above).  Objective:   Physical Exam   BP 112/78   Pulse 76   Temp 98.5 F (36.9 C) (Oral)   Wt 244 lb (110.7 kg)   SpO2 98%   BMI 38.22 kg/m  Wt Readings from Last 3 Encounters:  07/17/18 244 lb (110.7 kg)  07/13/18 242 lb 9.6 oz (110 kg)  01/11/18 223 lb (101.2 kg)    General: Appears her stated age, obese in NAD. Neck:  Neck supple, trachea midline. No masses, lumps  present.  Cardiovascular: Normal rate and rhythm.  Pulmonary/Chest: Normal effort and positive vesicular breath sounds. No respiratory distress. No wheezes, rales or ronchi noted.  Neurological: Alert and oriented.  Psychiatric: Mood and affect normal. Behavior is normal. Judgment and thought content normal.    BMET    Component Value Date/Time   NA 140 01/07/2018 1233   K 4.1 01/07/2018 1233   CL 108 01/07/2018 1233   CO2 23 01/07/2018 1233   GLUCOSE 87 01/07/2018 1233   BUN 14 01/07/2018 1233   CREATININE 0.72 01/07/2018 1233   CALCIUM 9.1 01/07/2018 1233   GFRNONAA >60 01/07/2018 1233   GFRAA >60 01/07/2018 1233    Lipid Panel     Component Value Date/Time   CHOL 172 11/26/2013 1421   TRIG 118.0 11/26/2013 1421   HDL 39.80 11/26/2013 1421   CHOLHDL 4 11/26/2013 1421   VLDL 23.6 11/26/2013 1421   LDLCALC 109 (H) 11/26/2013 1421    CBC    Component Value Date/Time   WBC 9.5 01/07/2018 1233   RBC 4.19 01/07/2018  1233   HGB 13.6 01/07/2018 1233   HCT 39.5 01/07/2018 1233   PLT 282 01/07/2018 1233   MCV 94.3 01/07/2018 1233   MCH 32.5 01/07/2018 1233   MCHC 34.4 01/07/2018 1233   RDW 12.7 01/07/2018 1233   LYMPHSABS 1.9 01/07/2018 1233   MONOABS 0.9 01/07/2018 1233   EOSABS 0.3 01/07/2018 1233  BASOSABS 0.0 01/07/2018 1233    Hgb A1C Lab Results  Component Value Date   HGBA1C 5.3 10/05/2016           Assessment & Plan:   Fatigue, Weight gain, Breast Tenderness:  We will check FSH and LH today  Multiple Thyroid Nodules:  TSH, free T4 and T3 today She has a thyroid ultrasound scheduled for later today She will continue to follow with endocrinology  Vitamin D Deficiency:  Vitamin D level today  Vitamin B12 Deficiency:  Vitamin B12 level today  We will follow-up after labs, return precautions discussed Nicki Reaper, NP

## 2018-07-17 NOTE — Patient Instructions (Signed)
Vitamin D Deficiency Vitamin D deficiency is when your body does not have enough vitamin D. Vitamin D is important because:  It helps your body use other minerals that your body needs.  It helps keep your bones strong and healthy.  It may help to prevent some diseases.  It helps your heart and other muscles work well.  You can get vitamin D by:  Eating foods with vitamin D in them.  Drinking or eating milk or other foods that have had vitamin D added to them.  Taking a vitamin D supplement.  Being in the sun.  Not getting enough vitamin D can make your bones become soft. It can also cause other health problems. Follow these instructions at home:  Take medicines and supplements only as told by your doctor.  Eat foods that have vitamin D. These include: ? Dairy products, cereals, or juices with added vitamin D. Check the label for vitamin D. ? Fatty fish like salmon or trout. ? Eggs. ? Oysters.  Do not use tanning beds.  Stay at a healthy weight. Lose weight, if needed.  Keep all follow-up visits as told by your doctor. This is important. Contact a doctor if:  Your symptoms do not go away.  You feel sick to your stomach (nauseous).  Youthrow up (vomit).  You poop less often than usual or you have trouble pooping (constipation). This information is not intended to replace advice given to you by your health care provider. Make sure you discuss any questions you have with your health care provider. Document Released: 08/19/2011 Document Revised: 02/05/2016 Document Reviewed: 01/15/2015 Elsevier Interactive Patient Education  2018 Else[employer] Inc.  

## 2018-07-18 LAB — T3: T3 TOTAL: 89 ng/dL (ref 76–181)

## 2018-07-19 ENCOUNTER — Encounter: Payer: Self-pay | Admitting: Internal Medicine

## 2018-07-25 ENCOUNTER — Other Ambulatory Visit: Payer: Self-pay | Admitting: Internal Medicine

## 2018-07-26 ENCOUNTER — Encounter: Payer: Self-pay | Admitting: Internal Medicine

## 2018-07-26 MED ORDER — VITAMIN D (ERGOCALCIFEROL) 1.25 MG (50000 UNIT) PO CAPS: 50000.0000 [IU] | ORAL_CAPSULE | ORAL | 0 refills | Status: DC

## 2018-08-02 DIAGNOSIS — F411 Generalized anxiety disorder: Secondary | ICD-10-CM | POA: Diagnosis not present

## 2018-08-08 ENCOUNTER — Ambulatory Visit
Admission: RE | Admit: 2018-08-08 | Discharge: 2018-08-08 | Disposition: A | Discharge status: Home / Self Care | Payer: BLUE CROSS/BLUE SHIELD | Source: Ambulatory Visit | Attending: Obstetrics & Gynecology | Admitting: Obstetrics & Gynecology

## 2018-08-08 DIAGNOSIS — N644 Mastodynia: Secondary | ICD-10-CM | POA: Insufficient documentation

## 2018-08-08 DIAGNOSIS — Z1231 Encounter for screening mammogram for malignant neoplasm of breast: Secondary | ICD-10-CM | POA: Diagnosis not present

## 2018-08-14 DIAGNOSIS — F411 Generalized anxiety disorder: Secondary | ICD-10-CM | POA: Diagnosis not present

## 2018-10-03 DIAGNOSIS — F411 Generalized anxiety disorder: Secondary | ICD-10-CM | POA: Diagnosis not present

## 2018-10-09 DIAGNOSIS — F411 Generalized anxiety disorder: Secondary | ICD-10-CM | POA: Diagnosis not present

## 2018-10-23 DIAGNOSIS — F411 Generalized anxiety disorder: Secondary | ICD-10-CM | POA: Diagnosis not present

## 2018-10-30 DIAGNOSIS — F411 Generalized anxiety disorder: Secondary | ICD-10-CM | POA: Diagnosis not present

## 2018-11-06 DIAGNOSIS — F411 Generalized anxiety disorder: Secondary | ICD-10-CM | POA: Diagnosis not present

## 2018-11-20 ENCOUNTER — Encounter: Payer: Self-pay | Admitting: Internal Medicine

## 2018-11-20 DIAGNOSIS — E559 Vitamin D deficiency, unspecified: Secondary | ICD-10-CM

## 2018-11-20 DIAGNOSIS — E538 Deficiency of other specified B group vitamins: Secondary | ICD-10-CM

## 2018-11-20 DIAGNOSIS — F411 Generalized anxiety disorder: Secondary | ICD-10-CM | POA: Diagnosis not present

## 2018-11-28 DIAGNOSIS — F411 Generalized anxiety disorder: Secondary | ICD-10-CM | POA: Diagnosis not present

## 2018-12-06 ENCOUNTER — Encounter: Payer: Self-pay | Admitting: Internal Medicine

## 2018-12-06 DIAGNOSIS — F411 Generalized anxiety disorder: Secondary | ICD-10-CM | POA: Diagnosis not present

## 2018-12-13 DIAGNOSIS — F411 Generalized anxiety disorder: Secondary | ICD-10-CM | POA: Diagnosis not present

## 2018-12-27 DIAGNOSIS — F411 Generalized anxiety disorder: Secondary | ICD-10-CM | POA: Diagnosis not present

## 2019-01-03 DIAGNOSIS — F411 Generalized anxiety disorder: Secondary | ICD-10-CM | POA: Diagnosis not present

## 2019-01-10 DIAGNOSIS — F411 Generalized anxiety disorder: Secondary | ICD-10-CM | POA: Diagnosis not present

## 2019-01-17 DIAGNOSIS — F411 Generalized anxiety disorder: Secondary | ICD-10-CM | POA: Diagnosis not present

## 2019-01-25 DIAGNOSIS — F411 Generalized anxiety disorder: Secondary | ICD-10-CM | POA: Diagnosis not present

## 2019-01-31 DIAGNOSIS — F411 Generalized anxiety disorder: Secondary | ICD-10-CM | POA: Diagnosis not present

## 2019-02-06 DIAGNOSIS — F411 Generalized anxiety disorder: Secondary | ICD-10-CM | POA: Diagnosis not present

## 2019-02-07 DIAGNOSIS — F411 Generalized anxiety disorder: Secondary | ICD-10-CM | POA: Diagnosis not present

## 2019-02-13 DIAGNOSIS — F411 Generalized anxiety disorder: Secondary | ICD-10-CM | POA: Diagnosis not present

## 2019-02-19 DIAGNOSIS — F411 Generalized anxiety disorder: Secondary | ICD-10-CM | POA: Diagnosis not present

## 2019-02-20 DIAGNOSIS — D2272 Melanocytic nevi of left lower limb, including hip: Secondary | ICD-10-CM | POA: Diagnosis not present

## 2019-02-20 DIAGNOSIS — D225 Melanocytic nevi of trunk: Secondary | ICD-10-CM | POA: Diagnosis not present

## 2019-02-20 DIAGNOSIS — D2262 Melanocytic nevi of left upper limb, including shoulder: Secondary | ICD-10-CM | POA: Diagnosis not present

## 2019-02-20 DIAGNOSIS — D2261 Melanocytic nevi of right upper limb, including shoulder: Secondary | ICD-10-CM | POA: Diagnosis not present

## 2019-02-21 DIAGNOSIS — F411 Generalized anxiety disorder: Secondary | ICD-10-CM | POA: Diagnosis not present

## 2019-03-06 DIAGNOSIS — F411 Generalized anxiety disorder: Secondary | ICD-10-CM | POA: Diagnosis not present

## 2019-03-13 DIAGNOSIS — F411 Generalized anxiety disorder: Secondary | ICD-10-CM | POA: Diagnosis not present

## 2019-03-14 DIAGNOSIS — E041 Nontoxic single thyroid nodule: Secondary | ICD-10-CM | POA: Diagnosis not present

## 2019-03-14 DIAGNOSIS — H6123 Impacted cerumen, bilateral: Secondary | ICD-10-CM | POA: Diagnosis not present

## 2019-04-04 DIAGNOSIS — F411 Generalized anxiety disorder: Secondary | ICD-10-CM | POA: Diagnosis not present

## 2019-04-16 ENCOUNTER — Encounter: Payer: Self-pay | Admitting: Internal Medicine

## 2019-04-19 DIAGNOSIS — F411 Generalized anxiety disorder: Secondary | ICD-10-CM | POA: Diagnosis not present

## 2019-04-25 DIAGNOSIS — F411 Generalized anxiety disorder: Secondary | ICD-10-CM | POA: Diagnosis not present

## 2019-04-26 DIAGNOSIS — F411 Generalized anxiety disorder: Secondary | ICD-10-CM | POA: Diagnosis not present

## 2019-05-07 DIAGNOSIS — F411 Generalized anxiety disorder: Secondary | ICD-10-CM | POA: Diagnosis not present

## 2019-05-23 DIAGNOSIS — F411 Generalized anxiety disorder: Secondary | ICD-10-CM | POA: Diagnosis not present

## 2019-05-25 DIAGNOSIS — H60332 Swimmer's ear, left ear: Secondary | ICD-10-CM | POA: Diagnosis not present

## 2019-05-25 DIAGNOSIS — H6123 Impacted cerumen, bilateral: Secondary | ICD-10-CM | POA: Diagnosis not present

## 2019-05-25 DIAGNOSIS — H6062 Unspecified chronic otitis externa, left ear: Secondary | ICD-10-CM | POA: Diagnosis not present

## 2019-05-31 DIAGNOSIS — F411 Generalized anxiety disorder: Secondary | ICD-10-CM | POA: Diagnosis not present

## 2019-06-11 DIAGNOSIS — F411 Generalized anxiety disorder: Secondary | ICD-10-CM | POA: Diagnosis not present

## 2019-06-18 DIAGNOSIS — F411 Generalized anxiety disorder: Secondary | ICD-10-CM | POA: Diagnosis not present

## 2019-06-26 ENCOUNTER — Encounter: Payer: Self-pay | Admitting: Radiology

## 2019-06-26 ENCOUNTER — Encounter: Payer: BLUE CROSS/BLUE SHIELD | Admitting: Internal Medicine

## 2019-06-26 ENCOUNTER — Encounter: Payer: Self-pay | Admitting: Internal Medicine

## 2019-07-04 DIAGNOSIS — F411 Generalized anxiety disorder: Secondary | ICD-10-CM | POA: Diagnosis not present

## 2019-07-06 ENCOUNTER — Encounter: Payer: Self-pay | Admitting: Internal Medicine

## 2019-07-06 ENCOUNTER — Ambulatory Visit (INDEPENDENT_AMBULATORY_CARE_PROVIDER_SITE_OTHER): Payer: BC Managed Care – PPO | Admitting: Internal Medicine

## 2019-07-06 ENCOUNTER — Other Ambulatory Visit: Payer: Self-pay

## 2019-07-06 VITALS — BP 100/70 | HR 95 | Temp 98.4°F | Ht 65.0 in | Wt 219.0 lb

## 2019-07-06 DIAGNOSIS — R5383 Other fatigue: Secondary | ICD-10-CM | POA: Diagnosis not present

## 2019-07-06 DIAGNOSIS — E559 Vitamin D deficiency, unspecified: Secondary | ICD-10-CM | POA: Diagnosis not present

## 2019-07-06 DIAGNOSIS — E538 Deficiency of other specified B group vitamins: Secondary | ICD-10-CM | POA: Diagnosis not present

## 2019-07-06 DIAGNOSIS — Z23 Encounter for immunization: Secondary | ICD-10-CM | POA: Diagnosis not present

## 2019-07-06 DIAGNOSIS — F411 Generalized anxiety disorder: Secondary | ICD-10-CM | POA: Diagnosis not present

## 2019-07-06 DIAGNOSIS — Z0000 Encounter for general adult medical examination without abnormal findings: Secondary | ICD-10-CM

## 2019-07-06 NOTE — Patient Instructions (Signed)
Health Maintenance, Female Adopting a healthy lifestyle and getting preventive care are important in promoting health and wellness. Ask your health care provider about:  The right schedule for you to have regular tests and exams.  Things you can do on your own to prevent diseases and keep yourself healthy. What should I know about diet, weight, and exercise? Eat a healthy diet   Eat a diet that includes plenty of vegetables, fruits, low-fat dairy products, and lean protein.  Do not eat a lot of foods that are high in solid fats, added sugars, or sodium. Maintain a healthy weight Body mass index (BMI) is used to identify weight problems. It estimates body fat based on height and weight. Your health care provider can help determine your BMI and help you achieve or maintain a healthy weight. Get regular exercise Get regular exercise. This is one of the most important things you can do for your health. Most adults should:  Exercise for at least 150 minutes each week. The exercise should increase your heart rate and make you sweat (moderate-intensity exercise).  Do strengthening exercises at least twice a week. This is in addition to the moderate-intensity exercise.  Spend less time sitting. Even light physical activity can be beneficial. Watch cholesterol and blood lipids Have your blood tested for lipids and cholesterol at 45 years of age, then have this test every 5 years. Have your cholesterol levels checked more often if:  Your lipid or cholesterol levels are high.  You are older than 45 years of age.  You are at high risk for heart disease. What should I know about cancer screening? Depending on your health history and family history, you may need to have cancer screening at various ages. This may include screening for:  Breast cancer.  Cervical cancer.  Colorectal cancer.  Skin cancer.  Lung cancer. What should I know about heart disease, diabetes, and high blood  pressure? Blood pressure and heart disease  High blood pressure causes heart disease and increases the risk of stroke. This is more likely to develop in people who have high blood pressure readings, are of African descent, or are overweight.  Have your blood pressure checked: ? Every 3-5 years if you are 18-39 years of age. ? Every year if you are 40 years old or older. Diabetes Have regular diabetes screenings. This checks your fasting blood sugar level. Have the screening done:  Once every three years after age 40 if you are at a normal weight and have a low risk for diabetes.  More often and at a younger age if you are overweight or have a high risk for diabetes. What should I know about preventing infection? Hepatitis B If you have a higher risk for hepatitis B, you should be screened for this virus. Talk with your health care provider to find out if you are at risk for hepatitis B infection. Hepatitis C Testing is recommended for:  Everyone born from 1945 through 1965.  Anyone with known risk factors for hepatitis C. Sexually transmitted infections (STIs)  Get screened for STIs, including gonorrhea and chlamydia, if: ? You are sexually active and are younger than 45 years of age. ? You are older than 45 years of age and your health care provider tells you that you are at risk for this type of infection. ? Your sexual activity has changed since you were last screened, and you are at increased risk for chlamydia or gonorrhea. Ask your health care provider if   you are at risk.  Ask your health care provider about whether you are at high risk for HIV. Your health care provider may recommend a prescription medicine to help prevent HIV infection. If you choose to take medicine to prevent HIV, you should first get tested for HIV. You should then be tested every 3 months for as long as you are taking the medicine. Pregnancy  If you are about to stop having your period (premenopausal) and  you may become pregnant, seek counseling before you get pregnant.  Take 400 to 800 micrograms (mcg) of folic acid every day if you become pregnant.  Ask for birth control (contraception) if you want to prevent pregnancy. Osteoporosis and menopause Osteoporosis is a disease in which the bones lose minerals and strength with aging. This can result in bone fractures. If you are 65 years old or older, or if you are at risk for osteoporosis and fractures, ask your health care provider if you should:  Be screened for bone loss.  Take a calcium or vitamin D supplement to lower your risk of fractures.  Be given hormone replacement therapy (HRT) to treat symptoms of menopause. Follow these instructions at home: Lifestyle  Do not use any products that contain nicotine or tobacco, such as cigarettes, e-cigarettes, and chewing tobacco. If you need help quitting, ask your health care provider.  Do not use street drugs.  Do not share needles.  Ask your health care provider for help if you need support or information about quitting drugs. Alcohol use  Do not drink alcohol if: ? Your health care provider tells you not to drink. ? You are pregnant, may be pregnant, or are planning to become pregnant.  If you drink alcohol: ? Limit how much you use to 0-1 drink a day. ? Limit intake if you are breastfeeding.  Be aware of how much alcohol is in your drink. In the [place] one drink equals one 12 oz bottle of beer (355 mL), one 5 oz glass of wine (148 mL), or one 1 oz glass of hard liquor (44 mL). General instructions  Schedule regular health, dental, and eye exams.  Stay current with your vaccines.  Tell your health care provider if: ? You often feel depressed. ? You have ever been abused or do not feel safe at home. Summary  Adopting a healthy lifestyle and getting preventive care are important in promoting health and wellness.  Follow your health care provider's instructions about healthy  diet, exercising, and getting tested or screened for diseases.  Follow your health care provider's instructions on monitoring your cholesterol and blood pressure. This information is not intended to replace advice given to you by your health care provider. Make sure you discuss any questions you have with your health care provider. Document Released: 03/15/2011 Document Revised: 08/23/2018 Document Reviewed: 08/23/2018 Elsevier Patient Education  2020 [employer]  

## 2019-07-06 NOTE — Progress Notes (Signed)
Subjective:    Patient ID: Lori HalimStaci L Saltz-Spieker, female    DOB: 09/12/1976, 43 y.o.   MRN: [id number]  HPI  Pt presents to the clinic today for her annual exam.   Anxiety: Triggered by general life stress. She does continue to see a therapist weekly. She is not currently taking any medications for this. She denies depression, SI/HI.  Flu:07/2018 Tetanus: > 10 years ago Pap Smear: 06/2018 Mammogram: 07/2018 Vision Screening: annually Dentist: as needed  Diet: She does eat meat. She consumes more veggies than fruits. She tries to avoid fried foods. She drinks mostly water. Exercise: Walking and step machine for 5 per weeks.  Review of Systems      Past Medical History:  Diagnosis Date  . Anxiety   . Vitamin D deficiency     Current Outpatient Medications  Medication Sig Dispense Refill  . Ascorbic Acid (VITAMIN C) 100 MG tablet Take 100 mg by mouth daily.    . cholecalciferol (VITAMIN D3) 25 MCG (1000 UT) tablet Take 2,000 Units by mouth daily.    . fexofenadine (ALLEGRA) 180 MG tablet Take 180 mg by mouth daily.    Marland Kitchen. ibuprofen (ADVIL,MOTRIN) 200 MG tablet Take 200 mg by mouth as needed.    Marland Kitchen. levonorgestrel (MIRENA) 20 MCG/24HR IUD 1 each by Intrauterine route once.    . Multiple Vitamin (MULTIVITAMIN) capsule Take 2 capsules by mouth daily.     No current facility-administered medications for this visit.     Allergies  Allergen Reactions  . Zoloft [Sertraline] Other (See Comments)    Chest pain, nausea, vomiting  . Eggs Or Egg-Derived Products   . Ciprofloxacin Other (See Comments)    Pt states it caused tingling in hands and feet.   . Flagyl [Metronidazole] Nausea And Vomiting    Family History  Problem Relation Age of Onset  . Hypertension Father   . Hypertension Brother   . Hypertension Paternal Grandmother   . Stroke Paternal Grandmother   . Heart disease Paternal Grandmother   . Diabetes Paternal Grandmother   . Cancer Neg Hx   . Breast cancer Neg Hx      Social History   Socioeconomic History  . Marital status: Married    Spouse name: Not on file  . Number of children: Not on file  . Years of education: Not on file  . Highest education level: Not on file  Occupational History  . Not on file  Social Needs  . Financial resource strain: Not on file  . Food insecurity    Worry: Not on file    Inability: Not on file  . Transportation needs    Medical: Not on file    Non-medical: Not on file  Tobacco Use  . Smoking status: Never Smoker  . Smokeless tobacco: Never Used  Substance and Sexual Activity  . Alcohol use: No    Alcohol/week: 7.0 standard drinks    Types: 7 Glasses of wine per week    Comment: occasional  . Drug use: No  . Sexual activity: Yes    Birth control/protection: I.U.D.  Lifestyle  . Physical activity    Days per week: Not on file    Minutes per session: Not on file  . Stress: Not on file  Relationships  . Social Musicianconnections    Talks on phone: Not on file    Gets together: Not on file    Attends religious service: Not on file    Active member of  club or organization: Not on file    Attends meetings of clubs or organizations: Not on file    Relationship status: Not on file  . Intimate partner violence    Fear of current or ex partner: Not on file    Emotionally abused: Not on file    Physically abused: Not on file    Forced sexual activity: Not on file  Other Topics Concern  . Not on file  Social History Narrative  . Not on file     Constitutional: Pt reports fatigue. Denies fever, malaise, headache or abrupt weight changes.  HEENT: Denies eye pain, eye redness, ear pain, ringing in the ears, wax buildup, runny nose, nasal congestion, bloody nose, or sore throat. Respiratory: Denies difficulty breathing, shortness of breath, cough or sputum production.   Cardiovascular: Denies chest pain, chest tightness, palpitations or swelling in the hands or feet.  Gastrointestinal: Denies abdominal pain,  bloating, constipation, diarrhea or blood in the stool.  GU: Denies urgency, frequency, pain with urination, burning sensation, blood in urine, odor or discharge. Musculoskeletal: Denies decrease in range of motion, difficulty with gait, muscle pain or joint pain and swelling.  Skin: Denies redness, rashes, lesions or ulcercations.  Neurological: Denies dizziness, difficulty with memory, difficulty with speech or problems with balance and coordination.  Psych: Pt has a history of anxiety. Denies depression, SI/HI.  No other specific complaints in a complete review of systems (except as listed in HPI above).  Objective:   Physical Exam BP 100/70 (BP Location: Left Arm, Patient Position: Sitting, Cuff Size: Large)   Pulse 95   Temp 98.4 F (36.9 C)   Ht 5\' 5"  (1.651 m)   Wt 99.3 kg   SpO2 96%   BMI 36.44 kg/m  Wt Readings from Last 3 Encounters:  07/06/19 99.3 kg  07/17/18 110.7 kg  07/13/18 110 kg    General: Appears her stated age, obese, in NAD. Skin: Warm, dry and intact. No rashes, lesions or ulcerations noted. HEENT: Head: normal shape and size; Eyes: sclera white, no icterus, conjunctiva pink, PERRLA and EOMs intact; Ears: Tm's gray and intact, normal light reflex; Neck:  Neck supple, trachea midline. No masses, lumps or thyromegaly present.  Cardiovascular: Normal rate and rhythm. S1,S2 noted.  No murmur, rubs or gallops noted. No JVD or BLE edema. Pulmonary/Chest: Normal effort and positive vesicular breath sounds. No respiratory distress. No wheezes, rales or ronchi noted.  Abdomen: Soft and nontender. Normal bowel sounds. No distention or masses noted. Liver, spleen and kidneys non palpable. Musculoskeletal: Strength 5/5 BUE/BLE. No difficulty with gait.  Neurological: Alert and oriented. Cranial nerves II-XII grossly intact. Coordination normal.  Psychiatric: Mood and affect normal. Behavior is normal. Judgment and thought content normal.     BMET    Component Value  Date/Time   NA 138 07/06/2019 1448   K 4.1 07/06/2019 1448   CL 104 07/06/2019 1448   CO2 23 07/06/2019 1448   GLUCOSE 90 07/06/2019 1448   BUN 15 07/06/2019 1448   CREATININE 0.82 07/06/2019 1448   CALCIUM 10.3 (H) 07/06/2019 1448   GFRNONAA >60 01/07/2018 1233   GFRAA >60 01/07/2018 1233    Lipid Panel     Component Value Date/Time   CHOL 170 07/06/2019 1448   TRIG 93 07/06/2019 1448   HDL 38 (L) 07/06/2019 1448   CHOLHDL 4.5 07/06/2019 1448   VLDL 23.6 11/26/2013 1421   LDLCALC 113 (H) 07/06/2019 1448    CBC  Component Value Date/Time   WBC 10.6 07/06/2019 1448   RBC 4.38 07/06/2019 1448   HGB 14.0 07/06/2019 1448   HCT 41.2 07/06/2019 1448   PLT 258 07/06/2019 1448   MCV 94.1 07/06/2019 1448   MCH 32.0 07/06/2019 1448   MCHC 34.0 07/06/2019 1448   RDW 12.8 07/06/2019 1448   LYMPHSABS 1.9 01/07/2018 1233   MONOABS 0.9 01/07/2018 1233   EOSABS 0.3 01/07/2018 1233   BASOSABS 0.0 01/07/2018 1233    Hgb A1C Lab Results  Component Value Date   HGBA1C 5.1 07/06/2019            Assessment & Plan:   Preventative Health Maintenance:  Flu shot today Tetanus UTD Pap smear UTD She will call to schedule her mammogram Encouraged her to consume a balanced diet and exercise regimen Advised her to see an eye doctor and dentist annually Will check CBC, CMET, TSH, Lipid and A1C  Fatigue:  Will check TSH, Vit D, B12  RTC in 1 year, sooner if needed Webb Silversmith, NP

## 2019-07-07 LAB — COMPREHENSIVE METABOLIC PANEL
AG Ratio: 1.4 (calc) (ref 1.0–2.5)
ALT: 32 U/L — ABNORMAL HIGH (ref 6–29)
AST: 23 U/L (ref 10–35)
Albumin: 4.2 g/dL (ref 3.6–5.1)
Alkaline phosphatase (APISO): 59 U/L (ref 31–125)
BUN: 15 mg/dL (ref 7–25)
CO2: 23 mmol/L (ref 20–32)
Calcium: 10.3 mg/dL — ABNORMAL HIGH (ref 8.6–10.2)
Chloride: 104 mmol/L (ref 98–110)
Creat: 0.82 mg/dL (ref 0.50–1.10)
Globulin: 2.9 g/dL (calc) (ref 1.9–3.7)
Glucose, Bld: 90 mg/dL (ref 65–99)
Potassium: 4.1 mmol/L (ref 3.5–5.3)
Sodium: 138 mmol/L (ref 135–146)
Total Bilirubin: 1.5 mg/dL — ABNORMAL HIGH (ref 0.2–1.2)
Total Protein: 7.1 g/dL (ref 6.1–8.1)

## 2019-07-07 LAB — CBC
HCT: 41.2 % (ref 35.0–45.0)
Hemoglobin: 14 g/dL (ref 11.7–15.5)
MCH: 32 pg (ref 27.0–33.0)
MCHC: 34 g/dL (ref 32.0–36.0)
MCV: 94.1 fL (ref 80.0–100.0)
MPV: 11.7 fL (ref 7.5–12.5)
Platelets: 258 10*3/uL (ref 140–400)
RBC: 4.38 10*6/uL (ref 3.80–5.10)
RDW: 12.8 % (ref 11.0–15.0)
WBC: 10.6 10*3/uL (ref 3.8–10.8)

## 2019-07-07 LAB — VITAMIN D 25 HYDROXY (VIT D DEFICIENCY, FRACTURES): Vit D, 25-Hydroxy: 52 ng/mL (ref 30–100)

## 2019-07-07 LAB — VITAMIN B12: Vitamin B-12: 458 pg/mL (ref 200–1100)

## 2019-07-07 LAB — LIPID PANEL
Cholesterol: 170 mg/dL (ref <200)
HDL: 38 mg/dL — ABNORMAL LOW (ref >=50)
LDL Cholesterol (Calc): 113 mg/dL (calc) — ABNORMAL HIGH
Non-HDL Cholesterol (Calc): 132 mg/dL (calc) — ABNORMAL HIGH (ref <130)
Total CHOL/HDL Ratio: 4.5 (calc) (ref <5.0)
Triglycerides: 93 mg/dL (ref <150)

## 2019-07-07 LAB — HEMOGLOBIN A1C
Hgb A1c MFr Bld: 5.1 % of total Hgb (ref <5.7)
Mean Plasma Glucose: 100 (calc)
eAG (mmol/L): 5.5 (calc)

## 2019-07-07 LAB — TSH: TSH: 2.89 m[IU]/L

## 2019-07-10 DIAGNOSIS — H47323 Drusen of optic disc, bilateral: Secondary | ICD-10-CM | POA: Diagnosis not present

## 2019-07-13 ENCOUNTER — Encounter: Payer: Self-pay | Admitting: Internal Medicine

## 2019-07-13 DIAGNOSIS — F411 Generalized anxiety disorder: Secondary | ICD-10-CM | POA: Insufficient documentation

## 2019-07-13 NOTE — Assessment & Plan Note (Signed)
Support offered today Will continue to monitor

## 2019-07-17 ENCOUNTER — Encounter: Payer: Self-pay | Admitting: Internal Medicine

## 2019-07-19 DIAGNOSIS — F411 Generalized anxiety disorder: Secondary | ICD-10-CM | POA: Diagnosis not present

## 2019-08-02 DIAGNOSIS — F411 Generalized anxiety disorder: Secondary | ICD-10-CM | POA: Diagnosis not present

## 2019-08-07 DIAGNOSIS — F411 Generalized anxiety disorder: Secondary | ICD-10-CM | POA: Diagnosis not present

## 2019-08-14 DIAGNOSIS — F411 Generalized anxiety disorder: Secondary | ICD-10-CM | POA: Diagnosis not present

## 2019-08-21 DIAGNOSIS — F411 Generalized anxiety disorder: Secondary | ICD-10-CM | POA: Diagnosis not present

## 2019-08-30 DIAGNOSIS — F411 Generalized anxiety disorder: Secondary | ICD-10-CM | POA: Diagnosis not present

## 2019-08-31 DIAGNOSIS — Z20828 Contact with and (suspected) exposure to other viral communicable diseases: Secondary | ICD-10-CM | POA: Diagnosis not present

## 2019-09-17 DIAGNOSIS — H6123 Impacted cerumen, bilateral: Secondary | ICD-10-CM | POA: Diagnosis not present

## 2019-09-17 DIAGNOSIS — R42 Dizziness and giddiness: Secondary | ICD-10-CM | POA: Diagnosis not present

## 2019-09-19 DIAGNOSIS — F411 Generalized anxiety disorder: Secondary | ICD-10-CM | POA: Diagnosis not present

## 2019-09-26 DIAGNOSIS — F411 Generalized anxiety disorder: Secondary | ICD-10-CM | POA: Diagnosis not present

## 2019-10-02 DIAGNOSIS — F411 Generalized anxiety disorder: Secondary | ICD-10-CM | POA: Diagnosis not present

## 2019-10-24 DIAGNOSIS — F411 Generalized anxiety disorder: Secondary | ICD-10-CM | POA: Diagnosis not present

## 2019-10-31 DIAGNOSIS — F411 Generalized anxiety disorder: Secondary | ICD-10-CM | POA: Diagnosis not present

## 2019-11-15 DIAGNOSIS — F411 Generalized anxiety disorder: Secondary | ICD-10-CM | POA: Diagnosis not present

## 2019-12-05 DIAGNOSIS — F411 Generalized anxiety disorder: Secondary | ICD-10-CM | POA: Diagnosis not present

## 2019-12-12 DIAGNOSIS — F411 Generalized anxiety disorder: Secondary | ICD-10-CM | POA: Diagnosis not present

## 2020-01-02 DIAGNOSIS — F411 Generalized anxiety disorder: Secondary | ICD-10-CM | POA: Diagnosis not present

## 2020-01-16 DIAGNOSIS — F411 Generalized anxiety disorder: Secondary | ICD-10-CM | POA: Diagnosis not present

## 2020-01-18 DIAGNOSIS — H6123 Impacted cerumen, bilateral: Secondary | ICD-10-CM | POA: Diagnosis not present

## 2020-01-18 DIAGNOSIS — H6060 Unspecified chronic otitis externa, unspecified ear: Secondary | ICD-10-CM | POA: Diagnosis not present

## 2020-01-30 DIAGNOSIS — F411 Generalized anxiety disorder: Secondary | ICD-10-CM | POA: Diagnosis not present

## 2020-02-19 DIAGNOSIS — D225 Melanocytic nevi of trunk: Secondary | ICD-10-CM | POA: Diagnosis not present

## 2020-02-19 DIAGNOSIS — D2261 Melanocytic nevi of right upper limb, including shoulder: Secondary | ICD-10-CM | POA: Diagnosis not present

## 2020-02-19 DIAGNOSIS — D2272 Melanocytic nevi of left lower limb, including hip: Secondary | ICD-10-CM | POA: Diagnosis not present

## 2020-02-19 DIAGNOSIS — D2262 Melanocytic nevi of left upper limb, including shoulder: Secondary | ICD-10-CM | POA: Diagnosis not present

## 2020-03-05 DIAGNOSIS — F411 Generalized anxiety disorder: Secondary | ICD-10-CM | POA: Diagnosis not present

## 2020-03-26 DIAGNOSIS — F411 Generalized anxiety disorder: Secondary | ICD-10-CM | POA: Diagnosis not present

## 2020-04-02 DIAGNOSIS — F411 Generalized anxiety disorder: Secondary | ICD-10-CM | POA: Diagnosis not present

## 2020-04-08 DIAGNOSIS — F411 Generalized anxiety disorder: Secondary | ICD-10-CM | POA: Diagnosis not present

## 2020-04-23 DIAGNOSIS — F411 Generalized anxiety disorder: Secondary | ICD-10-CM | POA: Diagnosis not present

## 2020-04-24 DIAGNOSIS — Z1152 Encounter for screening for COVID-19: Secondary | ICD-10-CM | POA: Diagnosis not present

## 2020-04-24 DIAGNOSIS — Z03818 Encounter for observation for suspected exposure to other biological agents ruled out: Secondary | ICD-10-CM | POA: Diagnosis not present

## 2020-04-25 ENCOUNTER — Ambulatory Visit: Payer: Self-pay | Admitting: Medical

## 2020-04-25 ENCOUNTER — Telehealth: Payer: Self-pay | Admitting: Medical

## 2020-04-25 ENCOUNTER — Encounter: Payer: Self-pay | Admitting: Medical

## 2020-04-25 ENCOUNTER — Other Ambulatory Visit: Payer: Self-pay

## 2020-04-25 DIAGNOSIS — Z20828 Contact with and (suspected) exposure to other viral communicable diseases: Secondary | ICD-10-CM

## 2020-04-25 LAB — POC COVID19 BINAXNOW: SARS Coronavirus 2 Ag: NEGATIVE

## 2020-04-25 NOTE — Progress Notes (Signed)
° °  Subjective:    Patient ID: Lori Huber, female    DOB: 04/07/1976, 44 y.o.   MRN: [id number]  HPI  44 yo female in non acute distress with symptoms of HA and ST x 2 days Husband tested positve this am on his PCR (his quick test was negative) . He started Wednesday with his  symptoms.    Allergies  Allergen Reactions   Zoloft [Sertraline] Other (See Comments)    Chest pain, nausea, vomiting   Eggs Or Egg-Derived Products    Ciprofloxacin Other (See Comments)    Pt states it caused tingling in hands and feet.    Flagyl [Metronidazole] Nausea And Vomiting    Current Outpatient Medications:    Calcium Carbonate (CALCIUM 600 PO), Take 1 tablet by mouth daily., Disp: , Rfl:    cholecalciferol (VITAMIN D3) 25 MCG (1000 UT) tablet, Take 4,000 Units by mouth daily. , Disp: , Rfl:    fexofenadine (ALLEGRA) 180 MG tablet, Take 180 mg by mouth daily., Disp: , Rfl:    ibuprofen (ADVIL,MOTRIN) 200 MG tablet, Take 200 mg by mouth as needed., Disp: , Rfl:    levonorgestrel (MIRENA) 20 MCG/24HR IUD, 1 each by Intrauterine route once., Disp: , Rfl:    Multiple Vitamin (MULTIVITAMIN) capsule, Take 2 capsules by mouth daily., Disp: , Rfl:   Review of Systems  Constitutional: Negative for chills, fatigue and fever.  HENT: Positive for sore throat. Negative for ear pain, postnasal drip, rhinorrhea, sinus pressure, sinus pain and sneezing.   Respiratory: Positive for cough (mild). Negative for shortness of breath and wheezing.   Cardiovascular: Negative for chest pain.  Gastrointestinal: Negative for abdominal pain, diarrhea, nausea and vomiting.  Musculoskeletal: Negative for myalgias.  Skin: Negative for rash.  Allergic/Immunologic: Positive for environmental allergies.  Neurological: Positive for headaches. Negative for dizziness, seizures, syncope and light-headedness.   Takes ibuprofen as needed    Objective:   Physical Exam  No physical exam was performed due to  telemedicine appointment.  Recent Results (from the past 2160 hour(s))  POC COVID-19     Status: Normal   Collection Time: 04/25/20 10:42 AM  Result Value Ref Range   SARS Coronavirus 2 Ag Negative Negative      Assessment & Plan:   Close Contact exposure of Covid-19 ( husband) Patient is symptomatic with HA and ST. Covid-19 PCR pending. Isolate from husband if possible. Patient verbalizes understanding and has no questions at the end of our conversation.

## 2020-04-26 ENCOUNTER — Encounter: Payer: Self-pay | Admitting: Medical

## 2020-04-26 LAB — NOVEL CORONAVIRUS, NAA: SARS-CoV-2, NAA: NOT DETECTED

## 2020-04-26 LAB — SARS-COV-2, NAA 2 DAY TAT

## 2020-04-30 DIAGNOSIS — F411 Generalized anxiety disorder: Secondary | ICD-10-CM | POA: Diagnosis not present

## 2020-05-08 DIAGNOSIS — F411 Generalized anxiety disorder: Secondary | ICD-10-CM | POA: Diagnosis not present

## 2020-05-13 DIAGNOSIS — F411 Generalized anxiety disorder: Secondary | ICD-10-CM | POA: Diagnosis not present

## 2020-05-16 DIAGNOSIS — H6123 Impacted cerumen, bilateral: Secondary | ICD-10-CM | POA: Diagnosis not present

## 2020-05-16 DIAGNOSIS — H6063 Unspecified chronic otitis externa, bilateral: Secondary | ICD-10-CM | POA: Diagnosis not present

## 2020-05-29 ENCOUNTER — Encounter: Payer: Self-pay | Admitting: Internal Medicine

## 2020-05-29 ENCOUNTER — Ambulatory Visit (INDEPENDENT_AMBULATORY_CARE_PROVIDER_SITE_OTHER): Payer: BC Managed Care – PPO | Admitting: Internal Medicine

## 2020-05-29 ENCOUNTER — Other Ambulatory Visit: Payer: Self-pay

## 2020-05-29 ENCOUNTER — Ambulatory Visit (INDEPENDENT_AMBULATORY_CARE_PROVIDER_SITE_OTHER)
Admission: RE | Admit: 2020-05-29 | Discharge: 2020-05-29 | Disposition: A | Discharge status: Home / Self Care | Payer: BC Managed Care – PPO | Source: Ambulatory Visit | Attending: Internal Medicine | Admitting: Internal Medicine

## 2020-05-29 VITALS — BP 106/68 | HR 68 | Temp 98.3°F | Wt 195.0 lb

## 2020-05-29 DIAGNOSIS — G8929 Other chronic pain: Secondary | ICD-10-CM | POA: Diagnosis not present

## 2020-05-29 DIAGNOSIS — M545 Low back pain: Secondary | ICD-10-CM

## 2020-05-29 MED ORDER — METHYLPREDNISOLONE ACETATE 80 MG/ML IJ SUSP
80.0000 mg | Freq: Once | INTRAMUSCULAR | Status: AC
  Administered 2020-05-29: 80 mg via INTRAMUSCULAR

## 2020-05-29 NOTE — Patient Instructions (Signed)

## 2020-05-29 NOTE — Progress Notes (Signed)
Subjective:    Patient ID: Lori Huber, female    DOB: Sep 14, 1975, 44 y.o.   MRN: [id number]  HPI   Pt presents to the clinic today with c/o right lower back pain. She reports this started about 3 years ago, but worse in the last 2 weeks.  She describes the pain as sharp and constant.  The pain is worse first thing in the morning and after sitting for long periods of time.  She states that the pain radiatiates down her right leg. She describes the right leg as throbbing with associated numbness, no tingling.  She denies loss of bowel or bladder control. She denies any injury to the area. She has tried stretching before walking which helps to loosen up the tightness and sensitivity that she experiences in her back.  She applies a heating pad daily for 30 minutes which offers minimal relief. She has seen a chiropractor 3 years ago for the same.  Review of Systems      Past Medical History:  Diagnosis Date  . Anxiety   . Vitamin D deficiency     Current Outpatient Medications  Medication Sig Dispense Refill  . Calcium Carbonate (CALCIUM 600 PO) Take 1 tablet by mouth daily.    . cholecalciferol (VITAMIN D3) 25 MCG (1000 UT) tablet Take 4,000 Units by mouth daily.     . fexofenadine (ALLEGRA) 180 MG tablet Take 180 mg by mouth daily.    Marland Kitchen ibuprofen (ADVIL,MOTRIN) 200 MG tablet Take 200 mg by mouth as needed.    Marland Kitchen levonorgestrel (MIRENA) 20 MCG/24HR IUD 1 each by Intrauterine route once.    . Multiple Vitamin (MULTIVITAMIN) capsule Take 2 capsules by mouth daily.     No current facility-administered medications for this visit.    Allergies  Allergen Reactions  . Zoloft [Sertraline] Other (See Comments)    Chest pain, nausea, vomiting  . Eggs Or Egg-Derived Products   . Ciprofloxacin Other (See Comments)    Pt states it caused tingling in hands and feet.   . Flagyl [Metronidazole] Nausea And Vomiting    Family History  Problem Relation Age of Onset  . Hypertension  Father   . Hypertension Brother   . Hypertension Paternal Grandmother   . Stroke Paternal Grandmother   . Heart disease Paternal Grandmother   . Diabetes Paternal Grandmother   . Cancer Neg Hx   . Breast cancer Neg Hx     Social History   Socioeconomic History  . Marital status: Married    Spouse name: Not on file  . Number of children: Not on file  . Years of education: Not on file  . Highest education level: Not on file  Occupational History  . Not on file  Tobacco Use  . Smoking status: Never Smoker  . Smokeless tobacco: Never Used  Substance and Sexual Activity  . Alcohol use: No    Alcohol/week: 7.0 standard drinks    Types: 7 Glasses of wine per week    Comment: occasional  . Drug use: No  . Sexual activity: Yes    Birth control/protection: I.U.D.  Other Topics Concern  . Not on file  Social History Narrative  . Not on file   Social Determinants of Health   Financial Resource Strain:   . Difficulty of Paying Living Expenses: Not on file  Food Insecurity:   . Worried About Programme researcher, broadcasting/film/video in the Last Year: Not on file  . Ran Out of Food  in the Last Year: Not on file  Transportation Needs:   . Lack of Transportation (Medical): Not on file  . Lack of Transportation (Non-Medical): Not on file  Physical Activity:   . Days of Exercise per Week: Not on file  . Minutes of Exercise per Session: Not on file  Stress:   . Feeling of Stress : Not on file  Social Connections:   . Frequency of Communication with Friends and Family: Not on file  . Frequency of Social Gatherings with Friends and Family: Not on file  . Attends Religious Services: Not on file  . Active Member of Clubs or Organizations: Not on file  . Attends Banker Meetings: Not on file  . Marital Status: Not on file  Intimate Partner Violence:   . Fear of Current or Ex-Partner: Not on file  . Emotionally Abused: Not on file  . Physically Abused: Not on file  . Sexually Abused: Not  on file     Constitutional: Denies fever, malaise, fatigue, headache or abrupt weight changes.  Respiratory: Denies difficulty breathing, shortness of breath, cough or sputum production.   Cardiovascular: Denies chest pain, chest tightness, palpitations or swelling in the hands or feet.  Musculoskeletal: Pt reports right sided low back pain.  Denies decrease in range of motion, joint swelling or difficulty with gait. Skin: Denies redness, rashes, lesions or ulcercations.  Neurological: Pt reports numbness in RLE. Denies problems with balance and coordination.   No other specific complaints in a complete review of systems (except as listed in HPI above).  Objective:   Physical Exam:  BP 106/68   Pulse 68   Temp 98.3 F (36.8 C) (Temporal)   Wt 195 lb (88.5 kg)   SpO2 98%   BMI 32.45 kg/m   Wt Readings from Last 3 Encounters:  07/06/19 219 lb (99.3 kg)  07/17/18 244 lb (110.7 kg)  07/13/18 242 lb 9.6 oz (110 kg)    General: Appears her stated age, obese, in NAD. Skin: Warm, dry and intact. No rashes noted. Cardiovascular: Normal rate and rhythm. S1,S2 noted.  No murmur, rubs or gallops noted.  Lungs: Normal breath sounds. No wheezes, rales or rhonchi. Musculoskeletal: Pain with extension but normal flexion, rotation and lateral bending. Pain with palpation over the lumbar and right paralumbar muscles. Normal abduction, adduction and external rotation of the right hip. Pain with internal rotation of the right hip. Strength 5/5 BLE. No difficulty with gait, but obvious difficulty getting from a laying to sitting and sitting to standing positions.  Neurological: Alert and oriented. Coordination normal. Negative SLR on the right.   BMET    Component Value Date/Time   NA 138 07/06/2019 1448   K 4.1 07/06/2019 1448   CL 104 07/06/2019 1448   CO2 23 07/06/2019 1448   GLUCOSE 90 07/06/2019 1448   BUN 15 07/06/2019 1448   CREATININE 0.82 07/06/2019 1448   CALCIUM 10.3 (H)  07/06/2019 1448   GFRNONAA >60 01/07/2018 1233   GFRAA >60 01/07/2018 1233    Lipid Panel     Component Value Date/Time   CHOL 170 07/06/2019 1448   TRIG 93 07/06/2019 1448   HDL 38 (L) 07/06/2019 1448   CHOLHDL 4.5 07/06/2019 1448   VLDL 23.6 11/26/2013 1421   LDLCALC 113 (H) 07/06/2019 1448    CBC    Component Value Date/Time   WBC 10.6 07/06/2019 1448   RBC 4.38 07/06/2019 1448   HGB 14.0 07/06/2019 1448  HCT 41.2 07/06/2019 1448   PLT 258 07/06/2019 1448   MCV 94.1 07/06/2019 1448   MCH 32.0 07/06/2019 1448   MCHC 34.0 07/06/2019 1448   RDW 12.8 07/06/2019 1448   LYMPHSABS 1.9 01/07/2018 1233   MONOABS 0.9 01/07/2018 1233   EOSABS 0.3 01/07/2018 1233   BASOSABS 0.0 01/07/2018 1233    Hgb A1C Lab Results  Component Value Date   HGBA1C 5.1 07/06/2019          Assessment & Plan:  Chronic Right Sided Low Back Pain:  DDX include osteoarthritis, musculoskeletal pain, sciatica 80 mg Depo IM today X Ray lumbar spine She declined RX for muscle relaxer at this time  Encouraged stretching and heat  Will followup after xray with further recommendation, return precautions discussed  Nicki Reaper, NP This visit occurred during the SARS-CoV-2 public health emergency.  Safety protocols were in place, including screening questions prior to the visit, additional usage of staff PPE, and extensive cleaning of exam room while observing appropriate contact time as indicated for disinfecting solutions.

## 2020-05-29 NOTE — Addendum Note (Signed)
Addended by: Roena Malady on: 05/29/2020 11:44 AM   Modules accepted: Orders

## 2020-06-10 DIAGNOSIS — F411 Generalized anxiety disorder: Secondary | ICD-10-CM | POA: Diagnosis not present

## 2020-07-03 DIAGNOSIS — F411 Generalized anxiety disorder: Secondary | ICD-10-CM | POA: Diagnosis not present

## 2020-07-10 DIAGNOSIS — F411 Generalized anxiety disorder: Secondary | ICD-10-CM | POA: Diagnosis not present

## 2020-07-17 DIAGNOSIS — F411 Generalized anxiety disorder: Secondary | ICD-10-CM | POA: Diagnosis not present

## 2020-08-18 ENCOUNTER — Other Ambulatory Visit: Payer: Self-pay | Admitting: Internal Medicine

## 2020-08-18 DIAGNOSIS — Z1231 Encounter for screening mammogram for malignant neoplasm of breast: Secondary | ICD-10-CM

## 2020-08-18 DIAGNOSIS — H6123 Impacted cerumen, bilateral: Secondary | ICD-10-CM | POA: Diagnosis not present

## 2020-08-18 DIAGNOSIS — H6980 Other specified disorders of Eustachian tube, unspecified ear: Secondary | ICD-10-CM | POA: Diagnosis not present

## 2020-08-18 DIAGNOSIS — H908 Mixed conductive and sensorineural hearing loss, unspecified: Secondary | ICD-10-CM | POA: Diagnosis not present

## 2020-08-18 DIAGNOSIS — H900 Conductive hearing loss, bilateral: Secondary | ICD-10-CM | POA: Diagnosis not present

## 2020-08-19 DIAGNOSIS — F411 Generalized anxiety disorder: Secondary | ICD-10-CM | POA: Diagnosis not present

## 2020-08-28 DIAGNOSIS — F411 Generalized anxiety disorder: Secondary | ICD-10-CM | POA: Diagnosis not present

## 2020-09-23 DIAGNOSIS — F411 Generalized anxiety disorder: Secondary | ICD-10-CM | POA: Diagnosis not present

## 2020-09-30 DIAGNOSIS — F411 Generalized anxiety disorder: Secondary | ICD-10-CM | POA: Diagnosis not present

## 2020-11-03 ENCOUNTER — Ambulatory Visit
Admission: RE | Admit: 2020-11-03 | Discharge: 2020-11-03 | Disposition: A | Discharge status: Home / Self Care | Payer: BC Managed Care – PPO | Source: Ambulatory Visit | Attending: Internal Medicine | Admitting: Internal Medicine

## 2020-11-03 ENCOUNTER — Other Ambulatory Visit: Payer: Self-pay

## 2020-11-03 DIAGNOSIS — Z1231 Encounter for screening mammogram for malignant neoplasm of breast: Secondary | ICD-10-CM | POA: Diagnosis not present

## 2020-11-05 DIAGNOSIS — F411 Generalized anxiety disorder: Secondary | ICD-10-CM | POA: Diagnosis not present

## 2020-11-19 DIAGNOSIS — F411 Generalized anxiety disorder: Secondary | ICD-10-CM | POA: Diagnosis not present

## 2020-12-03 DIAGNOSIS — F411 Generalized anxiety disorder: Secondary | ICD-10-CM | POA: Diagnosis not present

## 2020-12-17 DIAGNOSIS — F411 Generalized anxiety disorder: Secondary | ICD-10-CM | POA: Diagnosis not present

## 2020-12-31 DIAGNOSIS — F411 Generalized anxiety disorder: Secondary | ICD-10-CM | POA: Diagnosis not present

## 2021-01-07 DIAGNOSIS — F411 Generalized anxiety disorder: Secondary | ICD-10-CM | POA: Diagnosis not present

## 2021-01-16 DIAGNOSIS — Z20822 Contact with and (suspected) exposure to covid-19: Secondary | ICD-10-CM | POA: Diagnosis not present

## 2021-01-16 DIAGNOSIS — Z03818 Encounter for observation for suspected exposure to other biological agents ruled out: Secondary | ICD-10-CM | POA: Diagnosis not present

## 2021-01-21 DIAGNOSIS — F411 Generalized anxiety disorder: Secondary | ICD-10-CM | POA: Diagnosis not present

## 2021-01-28 DIAGNOSIS — F411 Generalized anxiety disorder: Secondary | ICD-10-CM | POA: Diagnosis not present

## 2021-02-20 DIAGNOSIS — D2261 Melanocytic nevi of right upper limb, including shoulder: Secondary | ICD-10-CM | POA: Diagnosis not present

## 2021-02-20 DIAGNOSIS — D2272 Melanocytic nevi of left lower limb, including hip: Secondary | ICD-10-CM | POA: Diagnosis not present

## 2021-02-20 DIAGNOSIS — D2262 Melanocytic nevi of left upper limb, including shoulder: Secondary | ICD-10-CM | POA: Diagnosis not present

## 2021-02-20 DIAGNOSIS — D225 Melanocytic nevi of trunk: Secondary | ICD-10-CM | POA: Diagnosis not present

## 2021-02-23 DIAGNOSIS — H6123 Impacted cerumen, bilateral: Secondary | ICD-10-CM | POA: Diagnosis not present

## 2021-02-23 DIAGNOSIS — E041 Nontoxic single thyroid nodule: Secondary | ICD-10-CM | POA: Diagnosis not present

## 2021-02-25 DIAGNOSIS — F411 Generalized anxiety disorder: Secondary | ICD-10-CM | POA: Diagnosis not present

## 2021-03-05 DIAGNOSIS — F411 Generalized anxiety disorder: Secondary | ICD-10-CM | POA: Diagnosis not present

## 2021-03-11 DIAGNOSIS — F411 Generalized anxiety disorder: Secondary | ICD-10-CM | POA: Diagnosis not present

## 2021-03-25 DIAGNOSIS — F411 Generalized anxiety disorder: Secondary | ICD-10-CM | POA: Diagnosis not present

## 2021-04-01 DIAGNOSIS — F411 Generalized anxiety disorder: Secondary | ICD-10-CM | POA: Diagnosis not present

## 2021-04-15 DIAGNOSIS — F411 Generalized anxiety disorder: Secondary | ICD-10-CM | POA: Diagnosis not present

## 2021-05-07 DIAGNOSIS — F411 Generalized anxiety disorder: Secondary | ICD-10-CM | POA: Diagnosis not present

## 2021-05-15 IMAGING — DX DG LUMBAR SPINE COMPLETE 4+V
5 series · 5 of 5 positions shown · non-contrast
Comparison: No

CLINICAL DATA: Low back pain for the past year.

EXAM:
LUMBAR SPINE - COMPLETE 4+ VIEW

[l-spine ap]
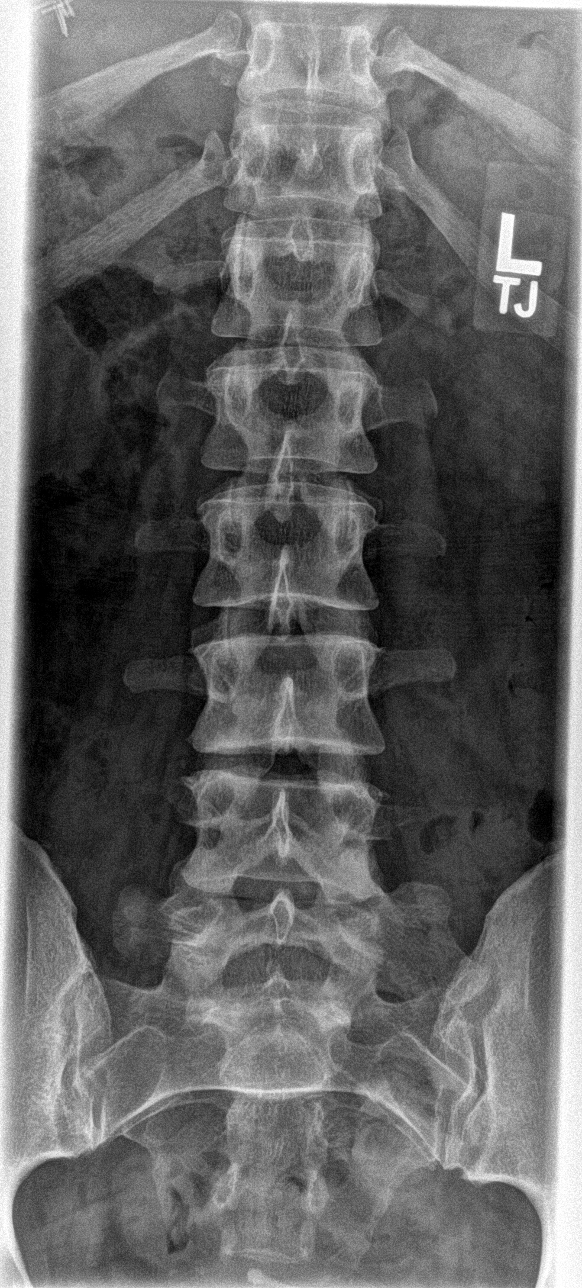

[l-spine obl (1 of 2)]
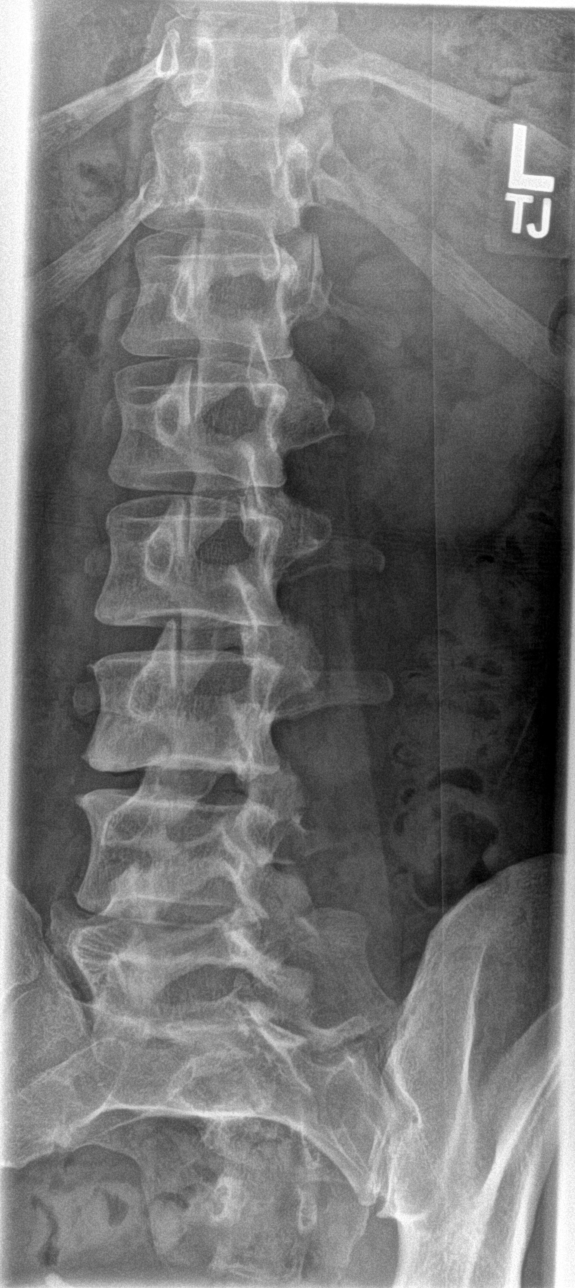

[l-spine obl (2 of 2)]
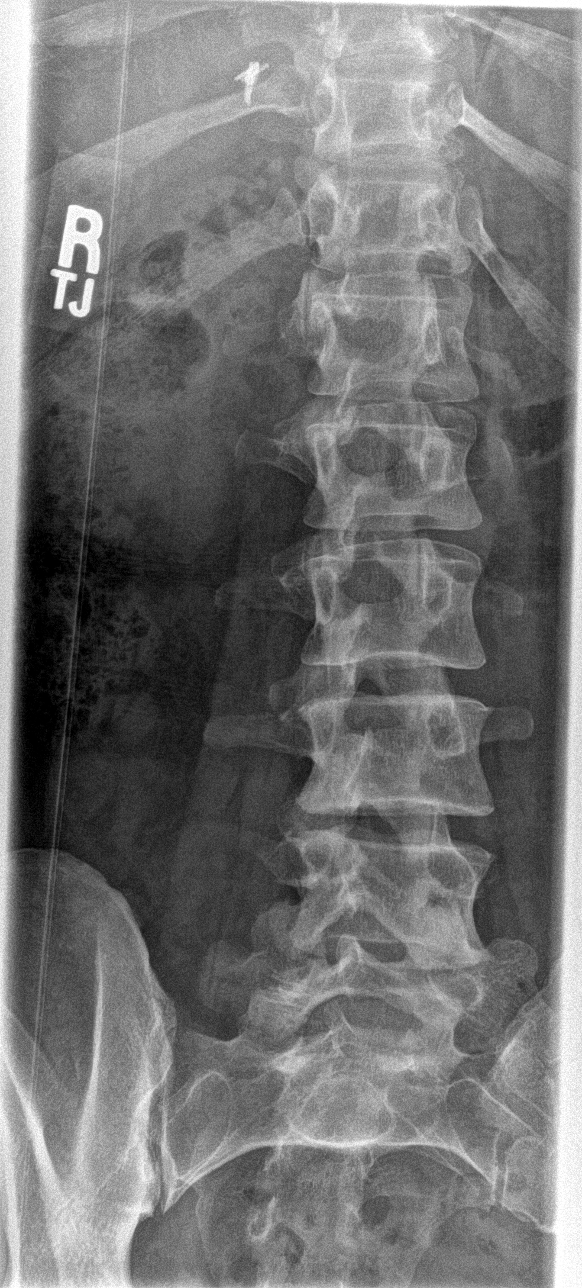

[l-spine lat]
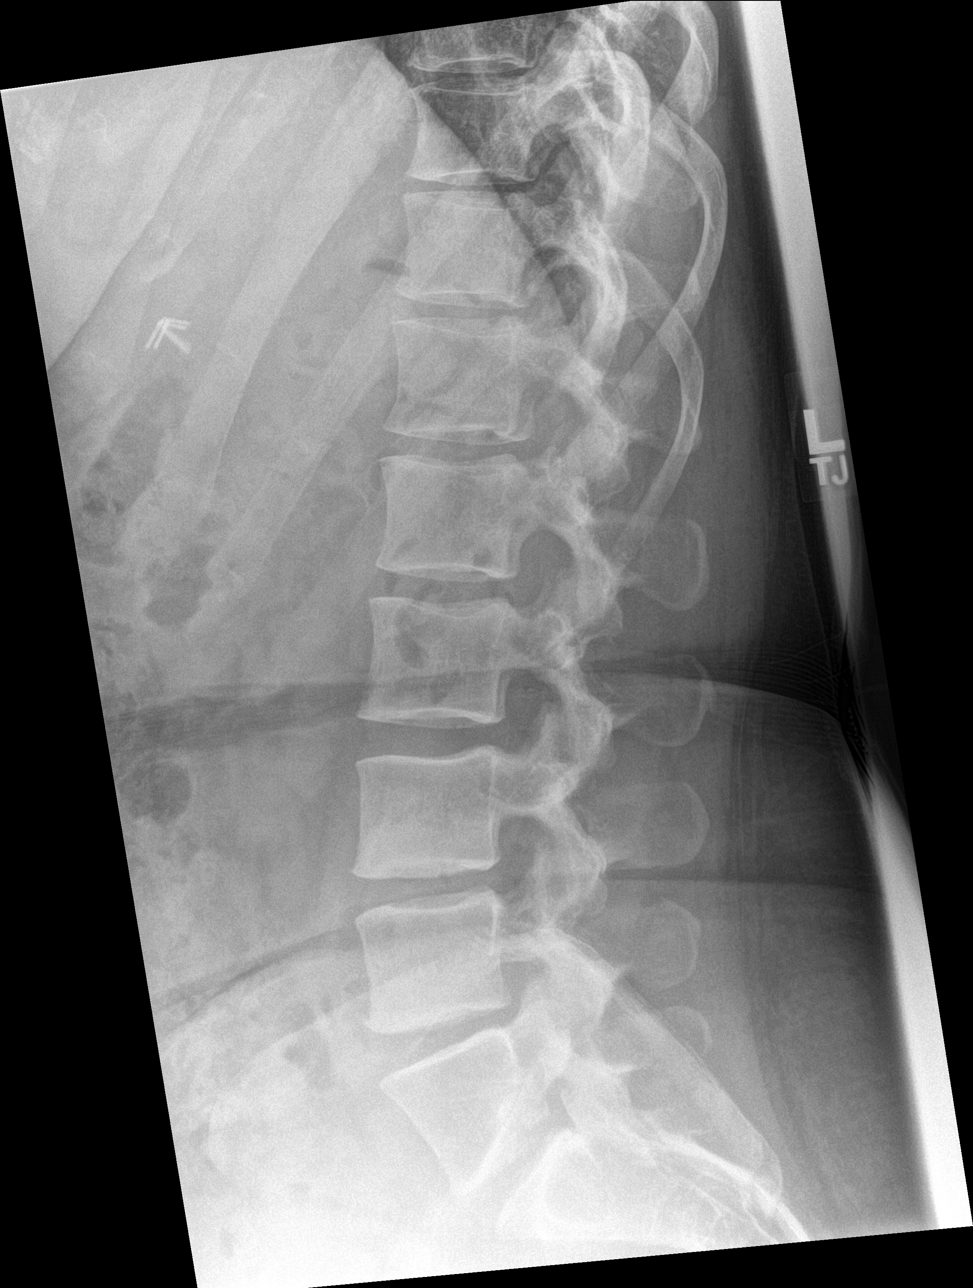

[l-spine l5/s1]
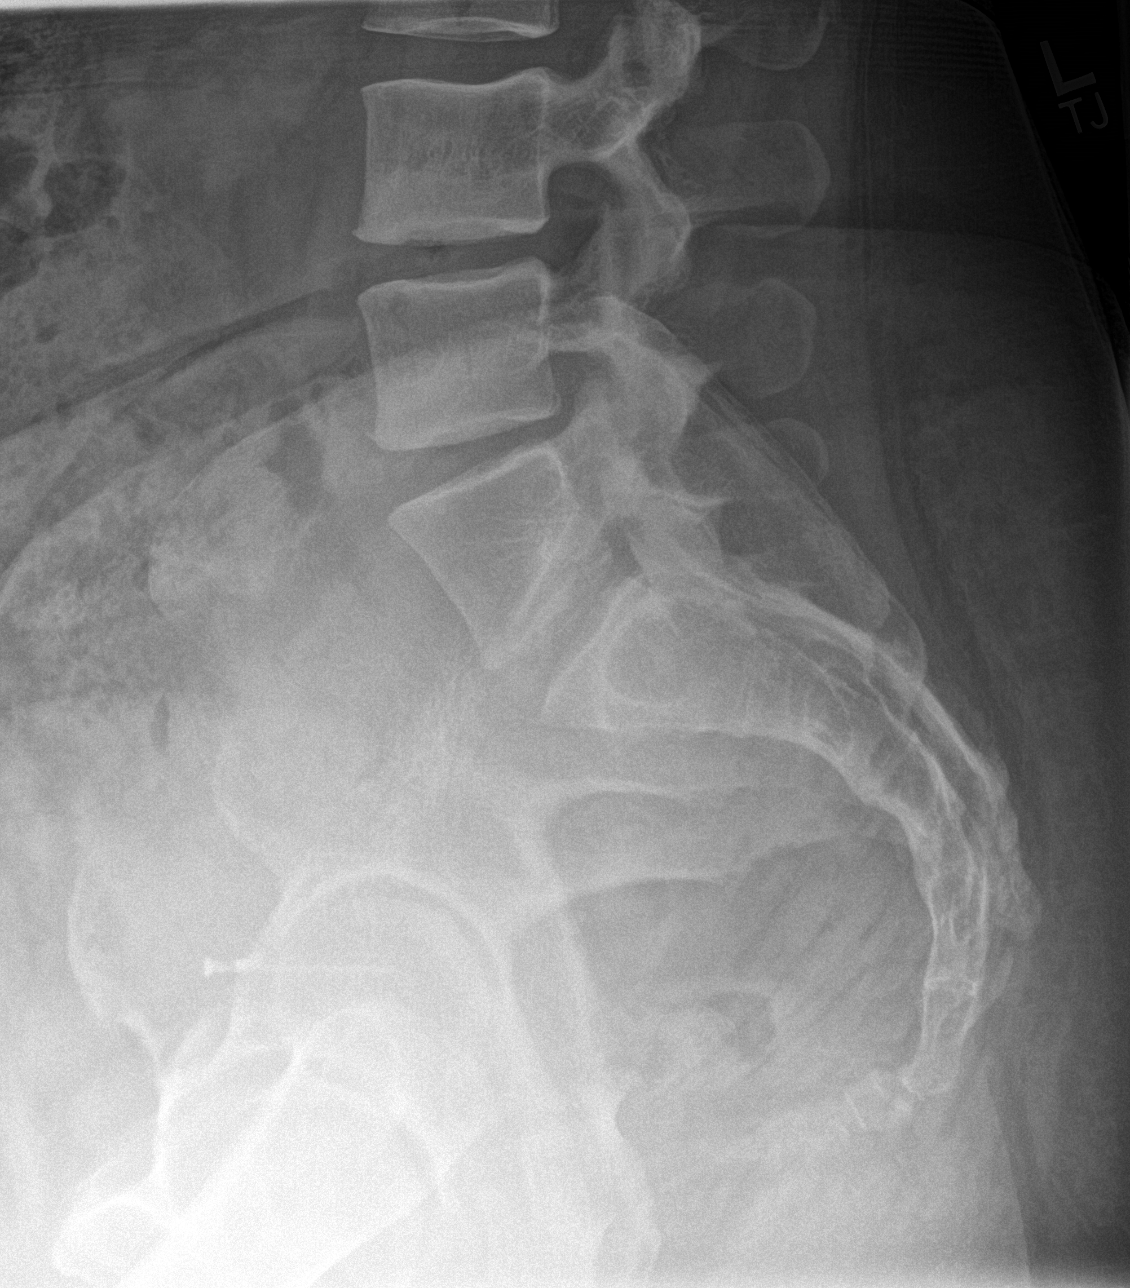

[5 of 5 positions shown; findings below may reference images not displayed]

FINDINGS: Transitional thoracolumbar and lumbosacral vertebrae with 4
intervening lumbar vertebrae. Minimal anterior and lateral spur
formation in the mid and lower lumbar spine. No fractures, pars
defects or subluxations. Cholecystectomy clips.
IMPRESSION: Minimal degenerative changes.

## 2021-05-21 DIAGNOSIS — F411 Generalized anxiety disorder: Secondary | ICD-10-CM | POA: Diagnosis not present

## 2021-06-18 DIAGNOSIS — F411 Generalized anxiety disorder: Secondary | ICD-10-CM | POA: Diagnosis not present

## 2021-07-01 ENCOUNTER — Other Ambulatory Visit: Payer: Self-pay

## 2021-07-01 ENCOUNTER — Ambulatory Visit: Payer: Self-pay

## 2021-07-02 DIAGNOSIS — F411 Generalized anxiety disorder: Secondary | ICD-10-CM | POA: Diagnosis not present

## 2021-07-06 ENCOUNTER — Other Ambulatory Visit: Payer: Self-pay

## 2021-07-06 ENCOUNTER — Ambulatory Visit: Payer: Self-pay

## 2021-07-06 DIAGNOSIS — Z23 Encounter for immunization: Secondary | ICD-10-CM

## 2021-07-15 ENCOUNTER — Ambulatory Visit: Payer: Self-pay | Admitting: Nurse Practitioner

## 2021-07-15 ENCOUNTER — Encounter: Payer: Self-pay | Admitting: Nurse Practitioner

## 2021-07-15 ENCOUNTER — Other Ambulatory Visit: Payer: Self-pay

## 2021-07-15 VITALS — BP 112/74 | HR 80 | Temp 98.0°F | Resp 16

## 2021-07-15 DIAGNOSIS — Z20822 Contact with and (suspected) exposure to covid-19: Secondary | ICD-10-CM

## 2021-07-15 DIAGNOSIS — J069 Acute upper respiratory infection, unspecified: Secondary | ICD-10-CM

## 2021-07-15 LAB — POC COVID19 BINAXNOW: SARS Coronavirus 2 Ag: NEGATIVE

## 2021-07-15 NOTE — Progress Notes (Signed)
   Subjective:    Patient ID: Lori Huber, female    DOB: [birth year], 45 y.o.   MRN: [id number]  HPI  45 year old female presenting to [facility] with complaints of headache and fatigue for the past two days with development of sore throat and nasal congestion today. She did have a temperature this am of 100.4 that has resolved. She last took medicine about 7am today.   Denies known sick contacts  Has been vaccinated for flu this season COVID vaccine and booster x2 Today's Vitals   07/15/21 1509  BP: 112/74  Pulse: 80  Resp: 16  Temp: 98 F (36.7 C)  TempSrc: Tympanic  SpO2: 98%   There is no height or weight on file to calculate BMI.   Review of Systems  Constitutional:  Positive for fatigue and fever.  HENT:  Positive for congestion, postnasal drip and sore throat.   Eyes: Negative.   Respiratory: Negative.    Cardiovascular: Negative.   Gastrointestinal: Negative.   Genitourinary: Negative.   Musculoskeletal: Negative.   Neurological: Negative.       Objective:   Physical Exam HENT:     Right Ear: There is impacted cerumen.     Left Ear: There is impacted cerumen.     Nose: Congestion and rhinorrhea present.     Right Turbinates: Enlarged.     Left Turbinates: Enlarged.     Mouth/Throat:     Comments: PND present without erythema or inflammation to pharynx  Cardiovascular:     Rate and Rhythm: Normal rate and regular rhythm.     Heart sounds: Normal heart sounds.  Pulmonary:     Effort: Pulmonary effort is normal.     Breath sounds: Normal breath sounds.  Musculoskeletal:        General: Normal range of motion.  Skin:    General: Skin is warm.  Neurological:     Mental Status: She is alert.      Recent Results (from the past 2160 hour(s))  POC COVID-19     Status: Normal   Collection Time: 07/15/21  3:14 PM  Result Value Ref Range   SARS Coronavirus 2 Ag Negative Negative    Comment: Patient vac and boosted. Aware of  negative POC results. Has appt with Lavone Neri FNP for further eval.       Assessment & Plan:   1. Encounter for screening laboratory testing for COVID-19 virus  - POC COVID-19 (negative)  2. Viral upper respiratory tract infection Advised management with OTC cold/flu medication per package instructions Push fluids Rest Assure adequate caloric intake  Salt water gargles for sore throat relief Honey/cough drops for added relief.  RTC if symptoms persist or with new concerns

## 2021-07-16 DIAGNOSIS — F411 Generalized anxiety disorder: Secondary | ICD-10-CM | POA: Diagnosis not present

## 2021-08-03 DIAGNOSIS — R070 Pain in throat: Secondary | ICD-10-CM | POA: Diagnosis not present

## 2021-08-03 DIAGNOSIS — H93293 Other abnormal auditory perceptions, bilateral: Secondary | ICD-10-CM | POA: Diagnosis not present

## 2021-08-03 DIAGNOSIS — H6123 Impacted cerumen, bilateral: Secondary | ICD-10-CM | POA: Diagnosis not present

## 2021-08-03 DIAGNOSIS — E041 Nontoxic single thyroid nodule: Secondary | ICD-10-CM | POA: Diagnosis not present

## 2021-08-03 DIAGNOSIS — J301 Allergic rhinitis due to pollen: Secondary | ICD-10-CM | POA: Diagnosis not present

## 2021-08-04 DIAGNOSIS — F411 Generalized anxiety disorder: Secondary | ICD-10-CM | POA: Diagnosis not present

## 2021-08-05 ENCOUNTER — Other Ambulatory Visit: Payer: Self-pay | Admitting: Otolaryngology

## 2021-08-05 DIAGNOSIS — E041 Nontoxic single thyroid nodule: Secondary | ICD-10-CM

## 2021-08-13 DIAGNOSIS — F411 Generalized anxiety disorder: Secondary | ICD-10-CM | POA: Diagnosis not present

## 2021-08-27 ENCOUNTER — Ambulatory Visit
Admission: RE | Admit: 2021-08-27 | Discharge: 2021-08-27 | Disposition: A | Discharge status: Home / Self Care | Payer: BC Managed Care – PPO | Source: Ambulatory Visit | Attending: Otolaryngology | Admitting: Otolaryngology

## 2021-08-27 DIAGNOSIS — E041 Nontoxic single thyroid nodule: Secondary | ICD-10-CM | POA: Diagnosis not present

## 2021-08-27 DIAGNOSIS — F411 Generalized anxiety disorder: Secondary | ICD-10-CM | POA: Diagnosis not present

## 2021-09-22 DIAGNOSIS — F411 Generalized anxiety disorder: Secondary | ICD-10-CM | POA: Diagnosis not present

## 2021-09-30 DIAGNOSIS — F411 Generalized anxiety disorder: Secondary | ICD-10-CM | POA: Diagnosis not present

## 2021-10-06 ENCOUNTER — Other Ambulatory Visit (HOSPITAL_COMMUNITY)
Admission: RE | Admit: 2021-10-06 | Discharge: 2021-10-06 | Disposition: A | Discharge status: Home / Self Care | Payer: BC Managed Care – PPO | Source: Ambulatory Visit | Attending: Obstetrics & Gynecology | Admitting: Obstetrics & Gynecology

## 2021-10-06 ENCOUNTER — Other Ambulatory Visit: Payer: Self-pay

## 2021-10-06 ENCOUNTER — Ambulatory Visit (INDEPENDENT_AMBULATORY_CARE_PROVIDER_SITE_OTHER): Payer: BC Managed Care – PPO | Admitting: Obstetrics & Gynecology

## 2021-10-06 ENCOUNTER — Encounter: Payer: Self-pay | Admitting: Obstetrics & Gynecology

## 2021-10-06 VITALS — BP 114/80 | HR 81 | Ht 66.0 in | Wt 231.0 lb

## 2021-10-06 DIAGNOSIS — Z1211 Encounter for screening for malignant neoplasm of colon: Secondary | ICD-10-CM

## 2021-10-06 DIAGNOSIS — Z01419 Encounter for gynecological examination (general) (routine) without abnormal findings: Secondary | ICD-10-CM

## 2021-10-06 DIAGNOSIS — Z30433 Encounter for removal and reinsertion of intrauterine contraceptive device: Secondary | ICD-10-CM

## 2021-10-06 DIAGNOSIS — Z1231 Encounter for screening mammogram for malignant neoplasm of breast: Secondary | ICD-10-CM

## 2021-10-06 MED ORDER — LEVONORGESTREL 20 MCG/DAY IU IUD
1.0000 | INTRAUTERINE_SYSTEM | Freq: Once | INTRAUTERINE | Status: AC
  Administered 2021-10-06: 09:00:00 1 via INTRAUTERINE

## 2021-10-06 NOTE — Progress Notes (Signed)
GYNECOLOGY ANNUAL PREVENTATIVE CARE ENCOUNTER NOTE  History:     Lori Huber is a 46 y.o. G44P0010 female here for a routine annual gynecologic exam.  Current complaints: increased spotting lately with IUD, needs it replaced as she feels the hormone levels are low, it was placed in 08/2015.   Also occasional lower abdominal cramping, none currently. Denies abnormal vaginal discharge, pelvic pain, problems with intercourse or other gynecologic concerns.    Gynecologic History No LMP recorded. (Menstrual status: IUD). Contraception: IUD Last Pap: 07/13/2018. Result was normal with negative HPV Last Mammogram: 11/03/2020.  Result was normal Last Colonoscopy: never had one  Obstetric History OB History  Gravida Para Term Preterm AB Living  1       1 0  SAB IAB Ectopic Multiple Live Births    1          # Outcome Date GA Lbr Len/2nd Weight Sex Delivery Anes PTL Lv  1 IAB 2001            Past Medical History:  Diagnosis Date   Anxiety    Vitamin D deficiency     Past Surgical History:  Procedure Laterality Date   CHOLECYSTECTOMY  2011   KIDNEY STONE SURGERY  1996   KNEE SURGERY Left     Current Outpatient Medications on File Prior to Visit  Medication Sig Dispense Refill   cholecalciferol (VITAMIN D3) 25 MCG (1000 UT) tablet Take 4,000 Units by mouth daily.      fexofenadine (ALLEGRA) 180 MG tablet Take 180 mg by mouth daily.     ibuprofen (ADVIL,MOTRIN) 200 MG tablet Take 200 mg by mouth as needed.     Multiple Vitamin (MULTIVITAMIN) capsule Take 2 capsules by mouth daily.     No current facility-administered medications on file prior to visit.    Allergies  Allergen Reactions   Zoloft [Sertraline] Other (See Comments)    Chest pain, nausea, vomiting   Eggs Or Egg-Derived Products    Ciprofloxacin Other (See Comments)    Pt states it caused tingling in hands and feet.    Flagyl [Metronidazole] Nausea And Vomiting    Social History:  reports that she  has never smoked. She has never used smokeless tobacco. She reports that she does not drink alcohol and does not use drugs.  Family History  Problem Relation Age of Onset   Hypertension Father    Hypertension Brother    Hypertension Paternal Grandmother    Stroke Paternal Grandmother    Heart disease Paternal Grandmother    Diabetes Paternal Grandmother    Cancer Neg Hx    Breast cancer Neg Hx     The following portions of the patient's history were reviewed and updated as appropriate: allergies, current medications, past family history, past medical history, past social history, past surgical history and problem list.  Review of Systems Pertinent items noted in HPI and remainder of comprehensive ROS otherwise negative.  Physical Exam:  BP 114/80    Pulse 81    Ht 5\' 6"  (1.676 m)    Wt 231 lb (104.8 kg)    BMI 37.28 kg/m  CONSTITUTIONAL: Well-developed, well-nourished female in no acute distress.  HENT:  Normocephalic, atraumatic, External right and left ear normal.  EYES: Conjunctivae and EOM are normal. Pupils are equal, round, and reactive to light. No scleral icterus.  NECK: Normal range of motion, supple, no masses.  Normal thyroid.  SKIN: Skin is warm and dry. No rash noted.  Not diaphoretic. No erythema. No pallor. MUSCULOSKELETAL: Normal range of motion. No tenderness.  No cyanosis, clubbing, or edema. NEUROLOGIC: Alert and oriented to person, place, and time. Normal reflexes, muscle tone coordination.  PSYCHIATRIC: Normal mood and affect. Normal behavior. Normal judgment and thought content. CARDIOVASCULAR: Normal heart rate noted, regular rhythm RESPIRATORY: Clear to auscultation bilaterally. Effort and breath sounds normal, no problems with respiration noted. BREASTS: Symmetric in size. No masses, tenderness, skin changes, nipple drainage, or lymphadenopathy bilaterally. Performed in the presence of a chaperone. ABDOMEN: Soft, no distention noted.  No tenderness, rebound or  guarding.  PELVIC: Normal appearing external genitalia and urethral meatus; normal appearing vaginal mucosa and cervix.  No abnormal vaginal discharge noted.  Copious cervical mucus noted. IUD strings visualized.  Pap smear obtained.  Normal uterine size, no other palpable masses, no uterine or adnexal tenderness.  Performed in the presence of a chaperone.  IUD Removal and Reinsertion  Patient identified, informed consent performed, consent signed.   Discussed risks of irregular bleeding, cramping, infection, malpositioning or misplacement of the IUD outside the uterus which may require further procedures. Also discussed >99% contraception efficacy, increased risk of ectopic pregnancy with failure of method.   Advised to use backup contraception for one week as the risk of pregnancy is higher during the transition period of removing an IUD and replacing it with another one. Time out was performed. After pap smear was done, the strings of the IUD were grasped and pulled using ring forceps. The IUD was successfully removed in its entirety. The cervix was cleaned with Betadine x 2 and grasped anteriorly with a single tooth tenaculum.  The new Mirena IUD insertion apparatus was used to sound the uterus to 8 cm;  the IUD was then placed per manufacturer's recommendations. Strings trimmed to 3 cm. Tenaculum was removed, good hemostasis noted. Patient tolerated procedure well.    Assessment and Plan:    1. Breast cancer screening by mammogram Mammogram scheduled - MM 3D SCREEN BREAST BILATERAL; Future  2. Colon cancer screening Referral made to Gastroenterology for colonoscopy - Ambulatory referral to Gastroenterology  3. Encounter for removal and reinsertion of intrauterine contraceptive device (IUD) Patient was given post-procedure instructions.  She was reminded to have backup contraception for one week during this transition period between IUDs.  Patient was also asked to check IUD strings periodically  and follow up in 4 weeks for IUD check. - levonorgestrel (MIRENA) 20 MCG/DAY IUD 1 each  4. Well woman exam with routine gynecological exam - Cytology - PAP Will follow up results of pap smear and manage accordingly. Routine preventative health maintenance measures emphasized. Please refer to After Visit Summary for other counseling recommendations.      Jaynie Collins, MD, FACOG Obstetrician & Gynecologist, [facility] for [employer], [facility]

## 2021-10-07 ENCOUNTER — Telehealth: Payer: Self-pay

## 2021-10-07 DIAGNOSIS — F411 Generalized anxiety disorder: Secondary | ICD-10-CM | POA: Diagnosis not present

## 2021-10-07 LAB — CYTOLOGY - PAP
Comment: NEGATIVE
Diagnosis: NEGATIVE
High risk HPV: NEGATIVE

## 2021-10-07 NOTE — Telephone Encounter (Signed)
CALLED PATIENT NO ANSWER LEFT VOICEMAIL FOR A CALL BACK °Letter sent °

## 2021-10-21 DIAGNOSIS — F411 Generalized anxiety disorder: Secondary | ICD-10-CM | POA: Diagnosis not present

## 2021-11-04 DIAGNOSIS — F411 Generalized anxiety disorder: Secondary | ICD-10-CM | POA: Diagnosis not present

## 2021-11-09 ENCOUNTER — Ambulatory Visit: Payer: BC Managed Care – PPO | Admitting: Family Medicine

## 2021-11-09 ENCOUNTER — Other Ambulatory Visit: Payer: Self-pay

## 2021-11-09 ENCOUNTER — Encounter: Payer: Self-pay | Admitting: Family Medicine

## 2021-11-09 DIAGNOSIS — Z30431 Encounter for routine checking of intrauterine contraceptive device: Secondary | ICD-10-CM | POA: Diagnosis not present

## 2021-11-09 NOTE — Progress Notes (Signed)
° °  GYNECOLOGY CLINIC- IUD STRING CHECK PROGRESS NOTE  History:  46 y.o. G1P0010 here today for today for IUD string check; Mirena was placed  1/24. No complaints about the Mirena, no concerning side effects.  The following portions of the patient's history were reviewed and updated as appropriate: allergies, current medications, past family history, past medical history, past social history, past surgical history and problem list. Last pap smear on 10/06/21 was normal, negatative HRHPV.  Review of Systems:  Pertinent items are noted in HPI.   Objective:  Physical Exam There were no vitals taken for this visit. Gen: NAD Abd: Soft, nontender and nondistended Pelvic: Normal appearing external genitalia; normal appearing vaginal mucosa and cervix.  IUD strings visualized, about 2 cm in length outside cervix.   Assessment & Plan:  Normal IUD check. Patient to keep IUD in place for eight years; can come in for removal at anytime Routine preventative health maintenance measures emphasized  Federico Flake, MD  Faculty Practice  Center for [facility], [facility]

## 2021-11-09 NOTE — Progress Notes (Signed)
Pt presents for IUD check. IUD placed 10/06/21. Has no other complaints or concerns. Barkley Boards, RN

## 2021-11-11 ENCOUNTER — Other Ambulatory Visit: Payer: Self-pay

## 2021-11-11 ENCOUNTER — Ambulatory Visit
Admission: RE | Admit: 2021-11-11 | Discharge: 2021-11-11 | Disposition: A | Discharge status: Home / Self Care | Payer: BC Managed Care – PPO | Source: Ambulatory Visit | Attending: Obstetrics & Gynecology | Admitting: Obstetrics & Gynecology

## 2021-11-11 DIAGNOSIS — Z1231 Encounter for screening mammogram for malignant neoplasm of breast: Secondary | ICD-10-CM | POA: Diagnosis not present

## 2021-11-18 DIAGNOSIS — F411 Generalized anxiety disorder: Secondary | ICD-10-CM | POA: Diagnosis not present

## 2021-12-02 DIAGNOSIS — F411 Generalized anxiety disorder: Secondary | ICD-10-CM | POA: Diagnosis not present

## 2021-12-23 DIAGNOSIS — F411 Generalized anxiety disorder: Secondary | ICD-10-CM | POA: Diagnosis not present

## 2022-01-06 DIAGNOSIS — F411 Generalized anxiety disorder: Secondary | ICD-10-CM | POA: Diagnosis not present

## 2022-01-21 DIAGNOSIS — F411 Generalized anxiety disorder: Secondary | ICD-10-CM | POA: Diagnosis not present

## 2022-02-04 DIAGNOSIS — H6123 Impacted cerumen, bilateral: Secondary | ICD-10-CM | POA: Diagnosis not present

## 2022-02-04 DIAGNOSIS — F411 Generalized anxiety disorder: Secondary | ICD-10-CM | POA: Diagnosis not present

## 2022-02-04 DIAGNOSIS — H93291 Other abnormal auditory perceptions, right ear: Secondary | ICD-10-CM | POA: Diagnosis not present

## 2022-02-18 DIAGNOSIS — F411 Generalized anxiety disorder: Secondary | ICD-10-CM | POA: Diagnosis not present

## 2022-03-04 DIAGNOSIS — F411 Generalized anxiety disorder: Secondary | ICD-10-CM | POA: Diagnosis not present

## 2022-03-25 DIAGNOSIS — F411 Generalized anxiety disorder: Secondary | ICD-10-CM | POA: Diagnosis not present

## 2022-04-07 DIAGNOSIS — F411 Generalized anxiety disorder: Secondary | ICD-10-CM | POA: Diagnosis not present

## 2022-04-22 DIAGNOSIS — F411 Generalized anxiety disorder: Secondary | ICD-10-CM | POA: Diagnosis not present

## 2022-05-06 DIAGNOSIS — F411 Generalized anxiety disorder: Secondary | ICD-10-CM | POA: Diagnosis not present

## 2022-05-20 DIAGNOSIS — F411 Generalized anxiety disorder: Secondary | ICD-10-CM | POA: Diagnosis not present

## 2022-06-30 ENCOUNTER — Encounter: Payer: Self-pay | Admitting: Nurse Practitioner

## 2022-06-30 ENCOUNTER — Ambulatory Visit: Payer: Self-pay | Admitting: Nurse Practitioner

## 2022-06-30 ENCOUNTER — Ambulatory Visit (INDEPENDENT_AMBULATORY_CARE_PROVIDER_SITE_OTHER): Payer: Self-pay | Admitting: Nurse Practitioner

## 2022-06-30 VITALS — BP 122/80 | HR 83 | Temp 98.5°F | Ht 66.0 in | Wt 226.4 lb

## 2022-06-30 DIAGNOSIS — J40 Bronchitis, not specified as acute or chronic: Secondary | ICD-10-CM

## 2022-06-30 MED ORDER — AZITHROMYCIN 250 MG PO TABS: ORAL_TABLET | ORAL | 0 refills | Status: AC

## 2022-06-30 MED ORDER — BENZONATATE 100 MG PO CAPS
100.0000 mg | ORAL_CAPSULE | Freq: Three times a day (TID) | ORAL | 0 refills | Status: DC | PRN
Start: 2022-06-30 — End: 2022-09-14

## 2022-06-30 MED ORDER — PREDNISONE 20 MG PO TABS: 20.0000 mg | ORAL_TABLET | Freq: Two times a day (BID) | ORAL | 0 refills | Status: AC

## 2022-06-30 NOTE — Progress Notes (Signed)
Therapist, [facility] [address] [place], [place] [address] [phone]  Office Visit Note  Patient Name: Lori Huber Date of Birth [id number]  Medical Record number [id number]  Date of Service: 06/30/2022  Chief Complaint  Patient presents with   Cough    Started 10 days ago. Cough, drainage in throat, fatigue, headache, ST, body aches, chills, body felt flush. Last COVID test was last Wed. Has taking 3 , all 3 neg.      HPI 46 year old female presenting to [facility] with complaints of flu like symptoms that lasted about 48 hours followed by 10 days of worsening cough. Her cough has been productive and dry.  She has tried Mucinex, Robitussin and Catering manager without relief.   Denies fever  Denies a history of asthma or need for inhalers in the past   Current Medication:  Outpatient Encounter Medications as of 06/30/2022  Medication Sig   cholecalciferol (VITAMIN D3) 25 MCG (1000 UT) tablet Take 4,000 Units by mouth daily.    fexofenadine (ALLEGRA) 180 MG tablet Take 180 mg by mouth daily.   ibuprofen (ADVIL,MOTRIN) 200 MG tablet Take 200 mg by mouth as needed. (Patient not taking: Reported on 06/30/2022)   Multiple Vitamin (MULTIVITAMIN) capsule Take 2 capsules by mouth daily. (Patient not taking: Reported on 06/30/2022)   No facility-administered encounter medications on file as of 06/30/2022.      Medical History: Past Medical History:  Diagnosis Date   Anxiety    Vitamin D deficiency      Vital Signs: BP 122/80 (BP Location: Left Arm, Patient Position: Sitting, Cuff Size: Normal)   Pulse 83   Temp 98.5 F (36.9 C) (Tympanic)   Ht 5\' 6"  (1.676 m)   Wt 226 lb 6.4 oz (102.7 kg)   SpO2 98%   BMI 36.54 kg/m    Review of Systems  Constitutional: Negative.   HENT:  Positive for congestion and sore throat.   Eyes: Negative.   Respiratory:  Positive for cough.   Cardiovascular: Negative.   Genitourinary: Negative.    Musculoskeletal: Negative.   Neurological: Negative.   Psychiatric/Behavioral: Negative.      Physical Exam HENT:     Head: Normocephalic.     Right Ear: Tympanic membrane, ear canal and external ear normal.     Left Ear: Tympanic membrane, ear canal and external ear normal.     Nose: Nose normal.     Mouth/Throat:     Mouth: Mucous membranes are moist.  Eyes:     Pupils: Pupils are equal, round, and reactive to light.  Cardiovascular:     Rate and Rhythm: Normal rate and regular rhythm.     Heart sounds: Normal heart sounds.  Pulmonary:     Effort: Pulmonary effort is normal.     Breath sounds: Normal breath sounds.  Musculoskeletal:     Cervical back: Normal range of motion.  Skin:    General: Skin is warm.     Capillary Refill: Capillary refill takes less than 2 seconds.  Neurological:     General: No focal deficit present.     Mental Status: She is alert.  Psychiatric:        Mood and Affect: Mood normal.       Assessment/Plan: 1. Bronchitis  - azithromycin (ZITHROMAX) 250 MG tablet; Take 2 tablets on day 1, then 1 tablet daily on days 2 through 5  Dispense: 6 tablet; Refill: 0 - benzonatate (TESSALON) 100  MG capsule; Take 1 capsule (100 mg total) by mouth 3 (three) times daily as needed for cough.  Dispense: 30 capsule; Refill: 0 - predniSONE (DELTASONE) 20 MG tablet; Take 1 tablet (20 mg total) by mouth 2 (two) times daily with a meal for 5 days.  Dispense: 10 tablet; Refill: 0    General Counseling: Sibyl verbalizes understanding of the findings of todays visit and agrees with plan of treatment. I have discussed any further diagnostic evaluation that may be needed or ordered today. We also reviewed her medications today. she has been encouraged to call the office with any questions or concerns that should arise related to todays visit.    Time spent:15 [place] [facility] Family Nurse Practitioner

## 2022-07-13 DIAGNOSIS — F411 Generalized anxiety disorder: Secondary | ICD-10-CM | POA: Diagnosis not present

## 2022-08-12 DIAGNOSIS — F411 Generalized anxiety disorder: Secondary | ICD-10-CM | POA: Diagnosis not present

## 2022-08-31 DIAGNOSIS — F411 Generalized anxiety disorder: Secondary | ICD-10-CM | POA: Diagnosis not present

## 2022-09-14 ENCOUNTER — Ambulatory Visit (INDEPENDENT_AMBULATORY_CARE_PROVIDER_SITE_OTHER): Payer: Managed Care, Other (non HMO) | Admitting: Internal Medicine

## 2022-09-14 ENCOUNTER — Encounter: Payer: Self-pay | Admitting: Internal Medicine

## 2022-09-14 VITALS — BP 122/80 | HR 73 | Temp 96.8°F | Ht 66.0 in | Wt 239.0 lb

## 2022-09-14 DIAGNOSIS — Z6838 Body mass index (BMI) 38.0-38.9, adult: Secondary | ICD-10-CM

## 2022-09-14 DIAGNOSIS — Z1231 Encounter for screening mammogram for malignant neoplasm of breast: Secondary | ICD-10-CM | POA: Diagnosis not present

## 2022-09-14 DIAGNOSIS — R739 Hyperglycemia, unspecified: Secondary | ICD-10-CM

## 2022-09-14 DIAGNOSIS — E7800 Pure hypercholesterolemia, unspecified: Secondary | ICD-10-CM | POA: Insufficient documentation

## 2022-09-14 DIAGNOSIS — Z1211 Encounter for screening for malignant neoplasm of colon: Secondary | ICD-10-CM | POA: Diagnosis not present

## 2022-09-14 DIAGNOSIS — E559 Vitamin D deficiency, unspecified: Secondary | ICD-10-CM

## 2022-09-14 DIAGNOSIS — R5383 Other fatigue: Secondary | ICD-10-CM

## 2022-09-14 DIAGNOSIS — Z0001 Encounter for general adult medical examination with abnormal findings: Secondary | ICD-10-CM

## 2022-09-14 DIAGNOSIS — R7309 Other abnormal glucose: Secondary | ICD-10-CM

## 2022-09-14 DIAGNOSIS — Z6832 Body mass index (BMI) 32.0-32.9, adult: Secondary | ICD-10-CM | POA: Insufficient documentation

## 2022-09-14 DIAGNOSIS — N926 Irregular menstruation, unspecified: Secondary | ICD-10-CM

## 2022-09-14 DIAGNOSIS — E6609 Other obesity due to excess calories: Secondary | ICD-10-CM | POA: Insufficient documentation

## 2022-09-14 NOTE — Patient Instructions (Signed)

## 2022-09-14 NOTE — Assessment & Plan Note (Signed)
Encourage diet and exercise for weight loss 

## 2022-09-14 NOTE — Progress Notes (Signed)
Subjective:    Patient ID: Lori Huber, female    DOB: 18-Aug-1976, 47 y.o.   MRN: [id number]  HPI  Patient presents to clinic today for her annual exam.  Flu: 06/2022 Tetanus: 06/2019 COVID: x 4 Pap smear: 09/2021 Mammogram: 11/2021 Colon screening: never Vision screening: annually Dentist: biannually  Diet: She does eat meat. She consumes fruits and veggies. She does eat some fried foods. She drinks mostly water. Exercise: 30 minute walk 4-5 times per week.  Review of Systems     Past Medical History:  Diagnosis Date   Anxiety    Vitamin D deficiency     Current Outpatient Medications  Medication Sig Dispense Refill   benzonatate (TESSALON) 100 MG capsule Take 1 capsule (100 mg total) by mouth 3 (three) times daily as needed for cough. 30 capsule 0   cholecalciferol (VITAMIN D3) 25 MCG (1000 UT) tablet Take 4,000 Units by mouth daily.      fexofenadine (ALLEGRA) 180 MG tablet Take 180 mg by mouth daily.     ibuprofen (ADVIL,MOTRIN) 200 MG tablet Take 200 mg by mouth as needed. (Patient not taking: Reported on 06/30/2022)     Multiple Vitamin (MULTIVITAMIN) capsule Take 2 capsules by mouth daily. (Patient not taking: Reported on 06/30/2022)     No current facility-administered medications for this visit.    Allergies  Allergen Reactions   Zoloft [Sertraline] Other (See Comments)    Chest pain, nausea, vomiting   Eggs Or Egg-Derived Products    Ciprofloxacin Other (See Comments)    Pt states it caused tingling in hands and feet.    Flagyl [Metronidazole] Nausea And Vomiting    Family History  Problem Relation Age of Onset   Hypertension Father    Hypertension Brother    Hypertension Paternal Grandmother    Stroke Paternal Grandmother    Heart disease Paternal Grandmother    Diabetes Paternal Grandmother    Cancer Neg Hx    Breast cancer Neg Hx     Social History   Socioeconomic History   Marital status: Married    Spouse name: Not on file    Number of children: Not on file   Years of education: Not on file   Highest education level: Not on file  Occupational History   Not on file  Tobacco Use   Smoking status: Never   Smokeless tobacco: Never  Substance and Sexual Activity   Alcohol use: No    Alcohol/week: 7.0 standard drinks of alcohol    Types: 7 Glasses of wine per week    Comment: occasional   Drug use: No   Sexual activity: Yes    Birth control/protection: I.U.D.  Other Topics Concern   Not on file  Social History Narrative   Not on file   Social Determinants of Health   Financial Resource Strain: Not on file  Food Insecurity: Not on file  Transportation Needs: Not on file  Physical Activity: Not on file  Stress: Not on file  Social Connections: Not on file  Intimate Partner Violence: Not on file     Constitutional: Patient reports weight gain.  Denies fever, malaise, fatigue, headache.  HEENT: Denies eye pain, eye redness, ear pain, ringing in the ears, wax buildup, runny nose, nasal congestion, bloody nose, or sore throat. Respiratory: Denies difficulty breathing, shortness of breath, cough or sputum production.   Cardiovascular: Patient reports intermittent swelling in legs.  Denies chest pain, chest tightness, palpitations or swelling in the hands.  Gastrointestinal: Denies abdominal pain, bloating, constipation, diarrhea or blood in the stool.  GU: Patient reports intermittent nocturia, irregular menses.  Denies urgency, frequency, pain with urination, burning sensation, blood in urine, odor or discharge. Musculoskeletal: Denies decrease in range of motion, difficulty with gait, muscle pain or joint pain and swelling.  Skin: Patient reports dry skin.  Denies redness, rashes, lesions or ulcercations.  Neurological: Denies dizziness, difficulty with memory, difficulty with speech or problems with balance and coordination.  Psych: Patient has a history of anxiety.  Denies depression, SI/HI.  No other  specific complaints in a complete review of systems (except as listed in HPI above).  Objective:   Physical Exam  BP 122/80 (BP Location: Left Arm, Patient Position: Sitting, Cuff Size: Normal)   Pulse 73   Temp (!) 96.8 F (36 C) (Temporal)   Ht _0  (1.676 m)   Wt 239 lb (108.4 kg)   SpO2 97%   BMI 38.58 kg/m   Wt Readings from Last 3 Encounters:  06/30/22 226 lb 6.4 oz (102.7 kg)  10/06/21 231 lb (104.8 kg)  05/29/20 195 lb (88.5 kg)    General: Appears her stated age, obese, in NAD. Skin: Warm, dry and intact. No rashes noted. HEENT: Head: normal shape and size; Eyes: sclera white, no icterus, conjunctiva pink, PERRLA and EOMs intact;  Neck:  Neck supple, trachea midline. No masses, lumps or thyromegaly present.  Cardiovascular: Normal rate and rhythm. S1,S2 noted.  No murmur, rubs or gallops noted. No JVD or BLE edema.  Pulmonary/Chest: Normal effort and positive vesicular breath sounds. No respiratory distress. No wheezes, rales or ronchi noted.  Abdomen: Normal bowel sounds.  Musculoskeletal: Strength 5/5 BUE/BLE.Marland Kitchen No difficulty with gait.  Neurological: Alert and oriented. Cranial nerves II-XII grossly intact. Coordination normal.  Psychiatric: Mood and affect normal. Behavior is normal. Judgment and thought content normal.     BMET    Component Value Date/Time   NA 138 07/06/2019 1448   K 4.1 07/06/2019 1448   CL 104 07/06/2019 1448   CO2 23 07/06/2019 1448   GLUCOSE 90 07/06/2019 1448   BUN 15 07/06/2019 1448   CREATININE 0.82 07/06/2019 1448   CALCIUM 10.3 (H) 07/06/2019 1448   GFRNONAA >60 01/07/2018 1233   GFRAA >60 01/07/2018 1233    Lipid Panel     Component Value Date/Time   CHOL 170 07/06/2019 1448   TRIG 93 07/06/2019 1448   HDL 38 (L) 07/06/2019 1448   CHOLHDL 4.5 07/06/2019 1448   VLDL 23.6 11/26/2013 1421   LDLCALC 113 (H) 07/06/2019 1448    CBC    Component Value Date/Time   WBC 10.6 07/06/2019 1448   RBC 4.38 07/06/2019 1448    HGB 14.0 07/06/2019 1448   HCT 41.2 07/06/2019 1448   PLT 258 07/06/2019 1448   MCV 94.1 07/06/2019 1448   MCH 32.0 07/06/2019 1448   MCHC 34.0 07/06/2019 1448   RDW 12.8 07/06/2019 1448   LYMPHSABS 1.9 01/07/2018 1233   MONOABS 0.9 01/07/2018 1233   EOSABS 0.3 01/07/2018 1233   BASOSABS 0.0 01/07/2018 1233    Hgb A1C Lab Results  Component Value Date   HGBA1C 5.1 07/06/2019            Assessment & Plan:   Preventative Health Maintenance:  Flu shot UTD Tetanus UTD Encouraged her to get her COVID-vaccine Pap smear UTD Mammogram ordered-she will call to schedule Referral to GI for screening colonoscopy Encouraged her to consume a balanced  diet and exercise regimen Advised her to see an eye doctor and dentist annually We will check CBC, c-Met, lipid and A1c today  Fatigue, Irregular Menses:  Will check TSH, vitamin D and FSH/LH  RTC in 6 months, follow-up chronic conditions Webb Silversmith, NP

## 2022-09-15 ENCOUNTER — Encounter: Payer: Self-pay | Admitting: Internal Medicine

## 2022-09-15 LAB — CBC
HCT: 39.1 % (ref 35.0–45.0)
Hemoglobin: 13.7 g/dL (ref 11.7–15.5)
MCH: 32.4 pg (ref 27.0–33.0)
MCHC: 35 g/dL (ref 32.0–36.0)
MCV: 92.4 fL (ref 80.0–100.0)
MPV: 9.9 fL (ref 7.5–12.5)
Platelets: 261 10*3/uL (ref 140–400)
RBC: 4.23 10*6/uL (ref 3.80–5.10)
RDW: 12.1 % (ref 11.0–15.0)
WBC: 6.8 10*3/uL (ref 3.8–10.8)

## 2022-09-15 LAB — LIPID PANEL
Cholesterol: 186 mg/dL (ref <200)
HDL: 49 mg/dL — ABNORMAL LOW (ref >=50)
LDL Cholesterol (Calc): 112 mg/dL (calc) — ABNORMAL HIGH
Non-HDL Cholesterol (Calc): 137 mg/dL (calc) — ABNORMAL HIGH (ref <130)
Total CHOL/HDL Ratio: 3.8 (calc) (ref <5.0)
Triglycerides: 131 mg/dL (ref <150)

## 2022-09-15 LAB — COMPLETE METABOLIC PANEL WITH GFR
AG Ratio: 1.4 (calc) (ref 1.0–2.5)
ALT: 13 U/L (ref 6–29)
AST: 14 U/L (ref 10–35)
Albumin: 4 g/dL (ref 3.6–5.1)
Alkaline phosphatase (APISO): 74 U/L (ref 31–125)
BUN: 16 mg/dL (ref 7–25)
CO2: 27 mmol/L (ref 20–32)
Calcium: 9.3 mg/dL (ref 8.6–10.2)
Chloride: 105 mmol/L (ref 98–110)
Creat: 0.77 mg/dL (ref 0.50–0.99)
Globulin: 2.8 g/dL (calc) (ref 1.9–3.7)
Glucose, Bld: 88 mg/dL (ref 65–99)
Potassium: 4.3 mmol/L (ref 3.5–5.3)
Sodium: 140 mmol/L (ref 135–146)
Total Bilirubin: 0.9 mg/dL (ref 0.2–1.2)
Total Protein: 6.8 g/dL (ref 6.1–8.1)
eGFR: 95 mL/min/{1.73_m2} (ref >=60)

## 2022-09-15 LAB — FSH/LH
FSH: 65 m[IU]/mL
LH: 42.5 m[IU]/mL

## 2022-09-15 LAB — VITAMIN D 25 HYDROXY (VIT D DEFICIENCY, FRACTURES): Vit D, 25-Hydroxy: 45 ng/mL (ref 30–100)

## 2022-09-15 LAB — HEMOGLOBIN A1C
Hgb A1c MFr Bld: 5.7 % of total Hgb — ABNORMAL HIGH (ref <5.7)
Mean Plasma Glucose: 117 mg/dL
eAG (mmol/L): 6.5 mmol/L

## 2022-09-15 LAB — TSH: TSH: 2.31 mIU/L

## 2022-12-31 ENCOUNTER — Other Ambulatory Visit: Payer: Self-pay | Admitting: Otolaryngology

## 2022-12-31 DIAGNOSIS — E041 Nontoxic single thyroid nodule: Secondary | ICD-10-CM

## 2023-01-13 ENCOUNTER — Ambulatory Visit
Admission: RE | Admit: 2023-01-13 | Discharge: 2023-01-13 | Disposition: A | Discharge status: Home / Self Care | Payer: Managed Care, Other (non HMO) | Source: Ambulatory Visit | Attending: Otolaryngology | Admitting: Otolaryngology

## 2023-01-13 ENCOUNTER — Encounter: Payer: Self-pay | Admitting: *Deleted

## 2023-01-13 DIAGNOSIS — E041 Nontoxic single thyroid nodule: Secondary | ICD-10-CM

## 2023-03-02 ENCOUNTER — Other Ambulatory Visit: Payer: Self-pay

## 2023-03-02 ENCOUNTER — Ambulatory Visit
Admission: RE | Admit: 2023-03-02 | Discharge: 2023-03-02 | Disposition: A | Discharge status: Home / Self Care | Payer: Managed Care, Other (non HMO) | Source: Ambulatory Visit | Attending: Emergency Medicine | Admitting: Emergency Medicine

## 2023-03-02 VITALS — BP 114/79 | HR 92 | Temp 98.2°F | Resp 20

## 2023-03-02 DIAGNOSIS — J029 Acute pharyngitis, unspecified: Secondary | ICD-10-CM

## 2023-03-02 DIAGNOSIS — J069 Acute upper respiratory infection, unspecified: Secondary | ICD-10-CM

## 2023-03-02 LAB — POCT RAPID STREP A (OFFICE): Rapid Strep A Screen: NEGATIVE

## 2023-03-02 MED ORDER — LIDOCAINE VISCOUS HCL 2 % MT SOLN: 15.0000 mL | OROMUCOSAL | 0 refills | Status: DC | PRN

## 2023-03-02 NOTE — ED Triage Notes (Signed)
Patient has a sore throat, cough. Non-productive cough. No fever, no runny nose.  Symptoms started last night, this morning felt like swallowing glass.   Has not had any medications for symptoms.  Patient has increased fluid intake

## 2023-03-02 NOTE — Discharge Instructions (Addendum)
Your strep test is negative.  Use the viscous lidocaine as directed.    Follow up with your primary care provider if your symptoms are not improving.    

## 2023-03-02 NOTE — ED Provider Notes (Signed)
Lori Huber    CSN: [id number] Arrival date & time: 03/02/23  1406      History   Chief Complaint Chief Complaint  Patient presents with   Sore Throat    Entered by patient   Appointment    2:30    HPI Lori Huber is a 47 y.o. female.  Patient presents with sore throat since last night.  She also reports mild occasional cough.  No fever, rash, shortness of breath, or other symptoms.  No OTC medications taken today.  The history is provided by the patient and medical records.    Past Medical History:  Diagnosis Date   Anxiety    Vitamin D deficiency     Patient Active Problem List   Diagnosis Date Noted   Pure hypercholesterolemia 09/14/2022   Class 2 obesity due to excess calories with body mass index (BMI) of 38.0 to 38.9 in adult 09/14/2022   Generalized anxiety disorder 07/13/2019    Past Surgical History:  Procedure Laterality Date   CHOLECYSTECTOMY  2011   KIDNEY STONE SURGERY  1996   KNEE SURGERY Left     OB History     Gravida  1   Para      Term      Preterm      AB  1   Living  0      SAB      IAB  1   Ectopic      Multiple      Live Births               Home Medications    Prior to Admission medications   Medication Sig Start Date End Date Taking? Authorizing Provider  lidocaine (XYLOCAINE) 2 % solution Use as directed 15 mLs in the mouth or throat as needed for mouth pain (Gargle and spit out.). 03/02/23  Yes Mickie Bail, NP  Ascorbic Acid (VITAMIN C) 100 MG tablet Take 100 mg by mouth daily.    [provider]  cholecalciferol (VITAMIN D3) 25 MCG (1000 UT) tablet Take 4,000 Units by mouth daily.     [provider]  fexofenadine (ALLEGRA) 180 MG tablet Take 180 mg by mouth daily.    [provider]  ibuprofen (ADVIL,MOTRIN) 200 MG tablet Take 200 mg by mouth as needed.    [provider]  Multiple Vitamin (MULTIVITAMIN) capsule Take 2 capsules by mouth daily.     [provider]    Family History Family History  Problem Relation Age of Onset   Hypertension Father    Hypertension Brother    Hypertension Paternal Grandmother    Stroke Paternal Grandmother    Heart disease Paternal Grandmother    Diabetes Paternal Grandmother    Breast cancer Neg Hx     Social History Social History   Tobacco Use   Smoking status: Never   Smokeless tobacco: Never  Vaping Use   Vaping Use: Never used  Substance Use Topics   Alcohol use: Yes    Alcohol/week: 7.0 standard drinks of alcohol    Types: 7 Glasses of wine per week    Comment: occasional   Drug use: No     Allergies   Zoloft [sertraline], Egg-derived products, Ciprofloxacin, and Flagyl [metronidazole]   Review of Systems Review of Systems  Constitutional:  Negative for chills and fever.  HENT:  Positive for sore throat. Negative for ear pain.   Respiratory:  Positive for cough. Negative  for shortness of breath.   Cardiovascular:  Negative for chest pain and palpitations.  Gastrointestinal:  Negative for diarrhea and vomiting.  Skin:  Negative for rash.     Physical Exam Triage Vital Signs ED Triage Vitals  Enc Vitals Group     BP      Pulse      Resp      Temp      Temp src      SpO2      Weight      Height      Head Circumference      Peak Flow      Pain Score      Pain Loc      Pain Edu?      Excl. in GC?    No data found.  Updated Vital Signs BP 114/79 (BP Location: Right Arm) Comment (BP Location): large cuff  Pulse 92   Temp 98.2 F (36.8 C) (Oral)   Resp 20   SpO2 94%   Visual Acuity Right Eye Distance:   Left Eye Distance:   Bilateral Distance:    Right Eye Near:   Left Eye Near:    Bilateral Near:     Physical Exam Vitals and nursing note reviewed.  Constitutional:      General: She is not in acute distress.    Appearance: She is well-developed.  HENT:     Right Ear: Tympanic membrane normal.     Left Ear: Tympanic membrane  normal.     Nose: Nose normal.     Mouth/Throat:     Mouth: Mucous membranes are moist.     Pharynx: Posterior oropharyngeal erythema present.  Cardiovascular:     Rate and Rhythm: Normal rate and regular rhythm.     Heart sounds: Normal heart sounds.  Pulmonary:     Effort: Pulmonary effort is normal. No respiratory distress.     Breath sounds: Normal breath sounds.  Musculoskeletal:     Cervical back: Neck supple.  Skin:    General: Skin is warm and dry.  Neurological:     Mental Status: She is alert.  Psychiatric:        Mood and Affect: Mood normal.        Behavior: Behavior normal.      UC Treatments / Results  Labs (all labs ordered are listed, but only abnormal results are displayed) Labs Reviewed  POCT RAPID STREP A (OFFICE)    EKG   Radiology No results found.  Procedures Procedures (including critical care time)  Medications Ordered in UC Medications - No data to display  Initial Impression / Assessment and Plan / UC Course  I have reviewed the triage vital signs and the nursing notes.  Pertinent labs & imaging results that were available during my care of the patient were reviewed by me and considered in my medical decision making (see chart for details).    Viral pharyngitis, viral URI.  Rapid strep negative.  Treating with viscous lidocaine.  Tylenol as needed.  Instructed patient to follow up with her PCP if her symptoms are not improving.  She agrees to plan of care.    Final Clinical Impressions(s) / UC Diagnoses   Final diagnoses:  Viral pharyngitis  Viral URI     Discharge Instructions      Your strep test is negative.    Use the viscous lidocaine as directed.  Follow up with your primary care provider if your symptoms are  not improving.        ED Prescriptions     Medication Sig Dispense Auth. Provider   lidocaine (XYLOCAINE) 2 % solution Use as directed 15 mLs in the mouth or throat as needed for mouth pain (Gargle and spit  out.). 100 mL Mickie Bail, NP      PDMP not reviewed this encounter.   Mickie Bail, NP 03/02/23 [phone]

## 2023-07-12 ENCOUNTER — Ambulatory Visit (INDEPENDENT_AMBULATORY_CARE_PROVIDER_SITE_OTHER): Payer: Self-pay

## 2023-07-12 DIAGNOSIS — Z23 Encounter for immunization: Secondary | ICD-10-CM

## 2023-10-10 ENCOUNTER — Encounter: Payer: Self-pay | Admitting: Internal Medicine

## 2023-10-10 ENCOUNTER — Ambulatory Visit (INDEPENDENT_AMBULATORY_CARE_PROVIDER_SITE_OTHER): Payer: Managed Care, Other (non HMO) | Admitting: Internal Medicine

## 2023-10-10 VITALS — BP 110/68 | Ht 66.0 in | Wt 198.6 lb

## 2023-10-10 DIAGNOSIS — E66811 Obesity, class 1: Secondary | ICD-10-CM

## 2023-10-10 DIAGNOSIS — E6609 Other obesity due to excess calories: Secondary | ICD-10-CM

## 2023-10-10 DIAGNOSIS — Z1211 Encounter for screening for malignant neoplasm of colon: Secondary | ICD-10-CM

## 2023-10-10 DIAGNOSIS — E559 Vitamin D deficiency, unspecified: Secondary | ICD-10-CM

## 2023-10-10 DIAGNOSIS — E7800 Pure hypercholesterolemia, unspecified: Secondary | ICD-10-CM | POA: Diagnosis not present

## 2023-10-10 DIAGNOSIS — R7303 Prediabetes: Secondary | ICD-10-CM | POA: Insufficient documentation

## 2023-10-10 DIAGNOSIS — Z0001 Encounter for general adult medical examination with abnormal findings: Secondary | ICD-10-CM

## 2023-10-10 DIAGNOSIS — Z1231 Encounter for screening mammogram for malignant neoplasm of breast: Secondary | ICD-10-CM

## 2023-10-10 DIAGNOSIS — M2550 Pain in unspecified joint: Secondary | ICD-10-CM

## 2023-10-10 DIAGNOSIS — Z6832 Body mass index (BMI) 32.0-32.9, adult: Secondary | ICD-10-CM

## 2023-10-10 NOTE — Assessment & Plan Note (Signed)
Encouraged diet and exercise for weight loss ?

## 2023-10-10 NOTE — Patient Instructions (Signed)

## 2023-10-10 NOTE — Progress Notes (Signed)
Subjective:    Patient ID: Lori Huber, female    DOB: 03-03-1976, 48 y.o.   MRN: [id number]  HPI  Patient presents to clinic today for her annual exam.  Flu: 06/2023 Tetanus: 06/2019 COVID: x 4 Pap smear: 09/2021 Mammogram: 11/2021 Colon screening: never Vision screening: annually Dentist: biannually  Diet: She does eat meat. She consumes fruits and veggies. She does eat some fried foods. She drinks mostly water. Exercise: 30 minute walk 4-5 times per week.  Review of Systems     Past Medical History:  Diagnosis Date   Anxiety    Vitamin D deficiency     Current Outpatient Medications  Medication Sig Dispense Refill   Ascorbic Acid (VITAMIN C) 100 MG tablet Take 100 mg by mouth daily.     cholecalciferol (VITAMIN D3) 25 MCG (1000 UT) tablet Take 4,000 Units by mouth daily.      fexofenadine (ALLEGRA) 180 MG tablet Take 180 mg by mouth daily.     ibuprofen (ADVIL,MOTRIN) 200 MG tablet Take 200 mg by mouth as needed.     lidocaine (XYLOCAINE) 2 % solution Use as directed 15 mLs in the mouth or throat as needed for mouth pain (Gargle and spit out.). 100 mL 0   Multiple Vitamin (MULTIVITAMIN) capsule Take 2 capsules by mouth daily.     No current facility-administered medications for this visit.    Allergies  Allergen Reactions   Zoloft [Sertraline] Other (See Comments)    Chest pain, nausea, vomiting   Egg-Derived Products    Ciprofloxacin Other (See Comments)    Pt states it caused tingling in hands and feet.    Flagyl [Metronidazole] Nausea And Vomiting    Family History  Problem Relation Age of Onset   Hypertension Father    Hypertension Brother    Hypertension Paternal Grandmother    Stroke Paternal Grandmother    Heart disease Paternal Grandmother    Diabetes Paternal Grandmother    Breast cancer Neg Hx     Social History   Socioeconomic History   Marital status: Married    Spouse name: Not on file   Number of children: Not on file    Years of education: Not on file   Highest education level: Master's degree (e.g., MA, MS, MEng, MEd, MSW, MBA)  Occupational History   Not on file  Tobacco Use   Smoking status: Never   Smokeless tobacco: Never  Vaping Use   Vaping status: Never Used  Substance and Sexual Activity   Alcohol use: Yes    Alcohol/week: 7.0 standard drinks of alcohol    Types: 7 Glasses of wine per week    Comment: occasional   Drug use: No   Sexual activity: Yes    Birth control/protection: I.U.D.  Other Topics Concern   Not on file  Social History Narrative   Not on file   Social Drivers of Health   Financial Resource Strain: Low Risk  (10/06/2023)   Overall Financial Resource Strain (CARDIA)    Difficulty of Paying Living Expenses: Not hard at all  Food Insecurity: No Food Insecurity (10/06/2023)   Hunger Vital Sign    Worried About Running Out of Food in the Last Year: Never true    Ran Out of Food in the Last Year: Never true  Transportation Needs: No Transportation Needs (10/06/2023)   PRAPARE - Administrator, Civil Service (Medical): No    Lack of Transportation (Non-Medical): No  Physical Activity: Sufficiently  Active (10/06/2023)   Exercise Vital Sign    Days of Exercise per Week: 5 days    Minutes of Exercise per Session: 30 min  Stress: Stress Concern Present (10/06/2023)   [employer] of Occupational Health - Occupational Stress Questionnaire    Feeling of Stress : To some extent  Social Connections: Moderately Isolated (10/06/2023)   Social Connection and Isolation Panel [NHANES]    Frequency of Communication with Friends and Family: Once a week    Frequency of Social Gatherings with Friends and Family: More than three times a week    Attends Religious Services: Never    Database administrator or Organizations: No    Attends Engineer, structural: Not on file    Marital Status: Married  Catering manager Violence: Not on file     Constitutional:   Denies fever, malaise, fatigue, headache or abnormal weight gain.  HEENT: Denies eye pain, eye redness, ear pain, ringing in the ears, wax buildup, runny nose, nasal congestion, bloody nose, or sore throat. Respiratory: Denies difficulty breathing, shortness of breath, cough or sputum production.   Cardiovascular:  Denies chest pain, chest tightness, palpitations or swelling in the hands and feet.  Gastrointestinal: Denies abdominal pain, bloating, constipation, diarrhea or blood in the stool.  GU: Patient reports irregular menses.  Denies urgency, frequency, pain with urination, burning sensation, blood in urine, odor or discharge. Musculoskeletal: Pt reports joint pain in hands, back. Denies decrease in range of motion, difficulty with gait, muscle pain or joint swelling.  Skin: Patient reports dry skin.  Denies redness, rashes, lesions or ulcercations.  Neurological: Denies dizziness, difficulty with memory, difficulty with speech or problems with balance and coordination.  Psych: Patient has a history of anxiety.  Denies depression, SI/HI.  No other specific complaints in a complete review of systems (except as listed in HPI above).  Objective:   Physical Exam  BP 110/68 (BP Location: Left Arm, Patient Position: Sitting, Cuff Size: Normal)   Ht 5\' 6"  (1.676 m)   Wt 198 lb 9.6 oz (90.1 kg)   LMP  (LMP Unknown)   BMI 32.05 kg/m    Wt Readings from Last 3 Encounters:  09/14/22 239 lb (108.4 kg)  06/30/22 226 lb 6.4 oz (102.7 kg)  10/06/21 231 lb (104.8 kg)    General: Appears her stated age, obese, in NAD. Skin: Warm, dry and intact. No rashes noted. HEENT: Head: normal shape and size; Eyes: sclera white, no icterus, conjunctiva pink, PERRLA and EOMs intact;  Neck:  Neck supple, trachea midline. No masses, lumps or thyromegaly present.  Cardiovascular: Normal rate and rhythm. S1,S2 noted.  No murmur, rubs or gallops noted. No JVD or BLE edema.  Pulmonary/Chest: Normal effort and  positive vesicular breath sounds. No respiratory distress. No wheezes, rales or ronchi noted.  Abdomen: Normal bowel sounds.  Musculoskeletal: Strength 5/5 BUE/BLE.  No joint swelling noted. No difficulty with gait.  Neurological: Alert and oriented. Cranial nerves II-XII grossly intact. Coordination normal.  Psychiatric: Mood and affect normal. Behavior is normal. Judgment and thought content normal.     BMET    Component Value Date/Time   NA 140 09/14/2022 0824   K 4.3 09/14/2022 0824   CL 105 09/14/2022 0824   CO2 27 09/14/2022 0824   GLUCOSE 88 09/14/2022 0824   BUN 16 09/14/2022 0824   CREATININE 0.77 09/14/2022 0824   CALCIUM 9.3 09/14/2022 0824   GFRNONAA >60 01/07/2018 1233   GFRAA >60 01/07/2018 1233  Lipid Panel     Component Value Date/Time   CHOL 186 09/14/2022 0824   TRIG 131 09/14/2022 0824   HDL 49 (L) 09/14/2022 0824   CHOLHDL 3.8 09/14/2022 0824   VLDL 23.6 11/26/2013 1421   LDLCALC 112 (H) 09/14/2022 0824    CBC    Component Value Date/Time   WBC 6.8 09/14/2022 0824   RBC 4.23 09/14/2022 0824   HGB 13.7 09/14/2022 0824   HCT 39.1 09/14/2022 0824   PLT 261 09/14/2022 0824   MCV 92.4 09/14/2022 0824   MCH 32.4 09/14/2022 0824   MCHC 35.0 09/14/2022 0824   RDW 12.1 09/14/2022 0824   LYMPHSABS 1.9 01/07/2018 1233   MONOABS 0.9 01/07/2018 1233   EOSABS 0.3 01/07/2018 1233   BASOSABS 0.0 01/07/2018 1233    Hgb A1C Lab Results  Component Value Date   HGBA1C 5.7 (H) 09/14/2022            Assessment & Plan:   Preventative Health Maintenance:  Flu shot UTD Tetanus UTD Encouraged her to get her COVID-vaccine Pap smear UTD Mammogram ordered-she will call to schedule Referral to GI for screening colonoscopy Encouraged her to consume a balanced diet and exercise regimen Advised her to see an eye doctor and dentist annually We will check CBC, c-Met, lipid and A1c today  Joint pain:  Will check ANA, ESR, CRP and rheumatoid factor  today  RTC in 6 months, follow-up chronic conditions Nicki Reaper, NP

## 2023-10-11 ENCOUNTER — Encounter: Payer: Self-pay | Admitting: Internal Medicine

## 2023-10-11 DIAGNOSIS — M2550 Pain in unspecified joint: Secondary | ICD-10-CM

## 2023-10-11 DIAGNOSIS — R768 Other specified abnormal immunological findings in serum: Secondary | ICD-10-CM

## 2023-10-12 LAB — COMPLETE METABOLIC PANEL WITH GFR
AG Ratio: 1.5 (calc) (ref 1.0–2.5)
ALT: 9 U/L (ref 6–29)
AST: 13 U/L (ref 10–35)
Albumin: 4.5 g/dL (ref 3.6–5.1)
Alkaline phosphatase (APISO): 75 U/L (ref 31–125)
BUN: 18 mg/dL (ref 7–25)
CO2: 29 mmol/L (ref 20–32)
Calcium: 10.3 mg/dL — ABNORMAL HIGH (ref 8.6–10.2)
Chloride: 104 mmol/L (ref 98–110)
Creat: 0.8 mg/dL (ref 0.50–0.99)
Globulin: 3 g/dL (ref 1.9–3.7)
Glucose, Bld: 84 mg/dL (ref 65–99)
Potassium: 4.4 mmol/L (ref 3.5–5.3)
Sodium: 142 mmol/L (ref 135–146)
Total Bilirubin: 1.7 mg/dL — ABNORMAL HIGH (ref 0.2–1.2)
Total Protein: 7.5 g/dL (ref 6.1–8.1)
eGFR: 90 mL/min/{1.73_m2} (ref >=60)

## 2023-10-12 LAB — CBC
HCT: 43.6 % (ref 35.0–45.0)
Hemoglobin: 14.7 g/dL (ref 11.7–15.5)
MCH: 32.4 pg (ref 27.0–33.0)
MCHC: 33.7 g/dL (ref 32.0–36.0)
MCV: 96 fL (ref 80.0–100.0)
MPV: 10.4 fL (ref 7.5–12.5)
Platelets: 253 10*3/uL (ref 140–400)
RBC: 4.54 10*6/uL (ref 3.80–5.10)
RDW: 11.8 % (ref 11.0–15.0)
WBC: 6.6 10*3/uL (ref 3.8–10.8)

## 2023-10-12 LAB — ANTI-NUCLEAR AB-TITER (ANA TITER): ANA Titer 1: 1:40 {titer} — ABNORMAL HIGH

## 2023-10-12 LAB — LIPID PANEL
Cholesterol: 185 mg/dL (ref <200)
HDL: 53 mg/dL (ref >=50)
LDL Cholesterol (Calc): 116 mg/dL — ABNORMAL HIGH
Non-HDL Cholesterol (Calc): 132 mg/dL — ABNORMAL HIGH (ref <130)
Total CHOL/HDL Ratio: 3.5 (calc) (ref <5.0)
Triglycerides: 72 mg/dL (ref <150)

## 2023-10-12 LAB — HEMOGLOBIN A1C
Hgb A1c MFr Bld: 5.5 %{Hb} (ref <5.7)
Mean Plasma Glucose: 111 mg/dL
eAG (mmol/L): 6.2 mmol/L

## 2023-10-12 LAB — VITAMIN D 25 HYDROXY (VIT D DEFICIENCY, FRACTURES): Vit D, 25-Hydroxy: 66 ng/mL (ref 30–100)

## 2023-10-12 LAB — SEDIMENTATION RATE: Sed Rate: 6 mm/h (ref 0–20)

## 2023-10-12 LAB — C-REACTIVE PROTEIN: CRP: <3 mg/L (ref <8.0)

## 2023-10-12 LAB — ANA: Anti Nuclear Antibody (ANA): POSITIVE — AB

## 2023-10-25 ENCOUNTER — Encounter: Payer: Self-pay | Admitting: *Deleted

## 2024-01-07 NOTE — ED Provider Notes (Signed)
 EMERGENCY DEPARTMENT ENCOUNTER    Pt Name: Lori Huber MRN: [id number] Birthdate 18-Jul-1976 Date of evaluation: 01/07/2024 ED Provider: Gar Ronal Daft, DO   CHIEF COMPLAINT     Chief Complaint  Patient presents with  . Finger Laceration     HISTORY OF PRESENT ILLNESS  (Location/Symptom, Timing/Onset, Context/Setting, Quality, Duration, Modifying Factors, Severity) Note limiting factors.  I wore appropriate PPE for the entirety of this encounter.    HPI 48 year old presents emergency department today with finger laceration to the left small finger and crush injury after she crushed her finger in the treadmill that she is moving.    Nursing Notes were reviewed. Limitations to history:  None Outside historians: None  REVIEW OF SYSTEMS    Review of Systems  Skin:  Positive for wound.   Pertinent positives and negatives as per HPI.  PAST MEDICAL HISTORY  History reviewed. No pertinent past medical history.  SURGICAL HISTORY    History reviewed. No pertinent surgical history.  CURRENT MEDICATIONS     There are no discharge medications for this patient.   ALLERGIES    Patient has no known allergies.  FAMILY HISTORY    No family history on file.   SOCIAL HISTORY     Social History   Socioeconomic History  . Marital status: Married  Tobacco Use  . Smoking status: Never  . Smokeless tobacco: Never   Social Drivers of Corporate investment banker Strain: Low Risk  (10/06/2023)   Received from [facility]   Overall Financial Resource Strain (CARDIA)   . Difficulty of Paying Living Expenses: Not hard at all  Food Insecurity: No Food Insecurity (10/06/2023)   Received from [facility]   Hunger Vital Sign   . Worried About Programme researcher, broadcasting/film/video in the Last Year: Never true   . Ran Out of Food in the Last Year: Never true  Transportation Needs: No Transportation Needs (10/06/2023)   Received from [facility] - Transportation   . Lack of  Transportation (Medical): No   . Lack of Transportation (Non-Medical): No  Physical Activity: Sufficiently Active (10/06/2023)   [facility] from [facility]   Exercise Vital Sign   . Days of Exercise per Week: 5 days   . Minutes of Exercise per Session: 30 min  Stress: Stress Concern Present (10/06/2023)   Received from [facility] of Occupational Health - Occupational Stress Questionnaire   . Feeling of Stress : To some extent  Social Connections: Moderately Isolated (10/06/2023)   Received from [facility]   Social Connection and Isolation Panel [NHANES]   . Frequency of Communication with Friends and Family: Once a week   . Frequency of Social Gatherings with Friends and Family: More than three times a week   . Attends Religious Services: Never   . Active Member of Clubs or Organizations: No   . Marital Status: Married  Housing Stability: Low Risk  (10/06/2023)   Received from [facility] Stability Vital Sign   . Unable to Pay for Housing in the Last Year: No   . Number of Times Moved in the Last Year: 0   . Homeless in the Last Year: No    SCREENINGS  Glasgow Coma Scale Best Eye Response: Spontaneous Best Verbal Response: Oriented Best Motor Response: Follows commands Glasgow Coma Scale Score: 15  PHYSICAL EXAM     ED Triage Vitals [01/07/24 1458]  Temp Heart Rate Resp BP  36.7 C (98 F) 78 16 118/80    SpO2 Temp Source Heart Rate Source Patient Position  96 % Oral Monitor --    BP Location FiO2 (%)    -- --      Physical Exam Vitals and nursing note reviewed.  HENT:     Head: Normocephalic and atraumatic.  Cardiovascular:     Rate and Rhythm: Normal rate.  Pulmonary:     Effort: Pulmonary effort is normal.  Skin:    General: Skin is warm and dry.     Comments: Left hand: normal flexion extension of all the digits with some tenderness of all 4 digits and a flap-like laceration over the small finger and over the index finger a skin  avulsion which is tiny.   Neurological:     General: No focal deficit present.     Mental Status: Mental status is at baseline.  Psychiatric:        Mood and Affect: Mood normal.        Behavior: Behavior normal.     DIAGNOSTIC RESULTS   Procedures/EKG:   EKG was reviewed by myself. Physician EKG interpretation can be found in [facility]  RADIOLOGY (Per Emergency Physician):    Interpretation per the Radiologist below, if available at the time of this note: XR hand 3+ views left  Final Result  No acute fracture or dislocation of the left hand.    Report Dictated on Workstation: [id number]    Electronically Signed By: Lamar Talitha Madura, MD    Electronically Signed Date/Time: 01/07/2024 3:43 PM EDT           ED BEDSIDE ULTRASOUND:  Performed by ED Physician - none  LABS: Labs Reviewed - No data to display  All other labs were within normal range or not returned as of this dictation.  EMERGENCY DEPARTMENT COURSE and DIFFERENTIAL DIAGNOSIS/MDM:  Vitals:   Vitals:   01/07/24 1458  BP: 118/80  Pulse: 78  Resp: 16  Temp: 36.7 C (98 F)  TempSrc: Oral  SpO2: 96%  Weight: 95.3 kg (210 lb)  Height: 1.676 m (5' 6)     ED Course as of 01/07/24 1733  Sat Jan 07, 2024  1447 48 year old presents emergency department today with finger laceration to the left small finger and crush injury after she crushed her finger in the treadmill that she is moving.  On exam has normal flexion extension of all the digits with some tenderness of all 4 digits and a flap-like laceration over the small finger and over the index finger a skin avulsion which is tiny.  Will obtain x-ray to evaluate for fracture give ibuprofen for pain and repair small finger laceration.  Tetanus already up-to-date.  Will apply bacitracin to finger avulsion wound on index. [BM]  1624 6 stitches, applied bacitracin and gauze. [BM]    ED Course User Index [BM] Gar Daft, DO      Diagnoses as of 01/07/24  1733  Laceration of left little finger without foreign body without damage to nail, initial encounter  X-ray of left hand showing no acute fracture.  Laceration Repair  Performed by: Gar Daft, DO Authorized by: Gar Daft, DO   Consent:    Consent obtained:  Verbal   Consent given by:  Patient   Risks, benefits, and alternatives were discussed: yes     Risks discussed:  Infection, pain, need for additional repair, poor cosmetic result, poor wound healing and nerve damage Universal protocol:    Procedure  explained and questions answered to patient or proxy's satisfaction: yes     Patient identity confirmed:  Verbally with patient Anesthesia:    Anesthesia method:  Nerve block   Block location:  3 injection sites at the base of the digit   Block needle gauge:  25 G   Block anesthetic:  Lidocaine  1% w/o epi   Block technique:  3 injection sites at the base of the digit   Block injection procedure:  Anatomic landmarks palpated, anatomic landmarks identified, introduced needle, negative aspiration for blood and incremental injection   Block outcome:  Anesthesia achieved Laceration details:    Location:  Finger   Finger location:  L small finger   Length (cm):  3 Pre-procedure details:    Preparation:  Patient was prepped and draped in usual sterile fashion and imaging obtained to evaluate for foreign bodies Exploration:    Limited defect created (wound extended): yes     Hemostasis achieved with:  Direct pressure   Imaging obtained: x-ray     Imaging outcome: foreign body not noted     Wound exploration: wound explored through full range of motion and entire depth of wound visualized     Contaminated: no   Treatment:    Area cleansed with:  Shur-Clens and saline   Amount of cleaning:  Standard   Irrigation solution:  Sterile saline   Irrigation volume:  30cc   Irrigation method:  Syringe   Visualized foreign bodies/material removed: yes     Debridement:  Minimal    Undermining:  None   Scar revision: no   Skin repair:    Repair method:  Sutures   Suture size:  5-0   Suture material:  Nylon   Suture technique:  Simple interrupted and running   Number of sutures:  6 Approximation:    Approximation:  Close Repair type:    Repair type:  Simple Post-procedure details:    Dressing:  Antibiotic ointment, adhesive bandage and tube gauze   Procedure completion:  Tolerated Comments:     Bulky dressing to little finger and Band-Aid over index finger of left hand 3 injection sites at the base of the digit  Medications  lidocaine  (Xylocaine ) 1 % injection 10 mL (10 mL Infiltration Given by Other 01/07/24 1545)  ibuprofen tablet 800 mg (800 mg Oral Given 01/07/24 1524)  bacitracin ointment ( Topical Given by Other 01/07/24 1525)    REVAL:     CRITICAL CARE TIME   FINAL IMPRESSION    1. Laceration of left little finger without foreign body without damage to nail, initial encounter       DISPOSITION   Discharge 01/07/2024 04:25:01 PM  PATIENT REFERRED TO: No follow-up provider specified.  DISCHARGE MEDICATIONS: There are no discharge medications for this patient.     (Comment: Please note this report has been produced using speech recognition software and may contain errors related to that system including errors in grammar, punctuation, and spelling, as well as words and phrases that may be inappropriate.  If there are any questions or concerns please feel free to contact the dictating provider for clarification.)  Brigid Ronal Daft, DO (electronically signed) Emergency Medicine Provider    Gar Daft, DO 01/07/24 1733

## 2024-01-08 ENCOUNTER — Encounter: Payer: Self-pay | Admitting: Internal Medicine

## 2024-01-08 DIAGNOSIS — T888XXD Other specified complications of surgical and medical care, not elsewhere classified, subsequent encounter: Secondary | ICD-10-CM

## 2024-01-17 ENCOUNTER — Encounter: Payer: Self-pay | Admitting: Internal Medicine

## 2024-01-17 ENCOUNTER — Ambulatory Visit: Admitting: Internal Medicine

## 2024-01-17 VITALS — BP 112/74 | Ht 66.0 in | Wt 219.2 lb

## 2024-01-17 DIAGNOSIS — S61217D Laceration without foreign body of left little finger without damage to nail, subsequent encounter: Secondary | ICD-10-CM

## 2024-01-17 DIAGNOSIS — Z4802 Encounter for removal of sutures: Secondary | ICD-10-CM

## 2024-01-17 DIAGNOSIS — S6710XD Crushing injury of unspecified finger(s), subsequent encounter: Secondary | ICD-10-CM | POA: Diagnosis not present

## 2024-01-17 NOTE — Progress Notes (Signed)
 Subjective:    Patient ID: Lori Huber, female    DOB: [birth year], 48 y.o.   MRN: [id number]  HPI  Discussed the use of AI scribe software for clinical note transcription with the patient, who gave verbal consent to proceed.  Lori Huber is a 48 year old female who presents with a crush injury to her fingers.  On April 26, she sustained a crush injury to her fingers while moving a treadmill. An x-ray performed in the ER revealed no fractures. Six sutures were placed on her left pinky finger, while another area  on her left index finger was not deep enough for sutures. The injury is located on the index finger and pinky, with nerve issues noted on the top half of the index finger and the pinky.  She experiences difficulty in flexing her fingers due to the sutures and pain when attempting to move them. The injury has resulted in significant swelling and bruising, particularly in the third and fourth fingers. No pus or significant redness is present, although some redness is attributed to the initial trauma. Her tetanus vaccination is up to date as of 2020.  She manages the wound by applying a light layer of Neosporin and covering it with a Band-Aid, ensuring it is exposed to air periodically to aid in drying. She avoids immersing the hand in water to prevent infection. Her husband assists in dressing the wound with a Band-Aid and gauze tape to keep it secure without restricting airflow.  No fever or other systemic symptoms.       Review of Systems   Past Medical History:  Diagnosis Date   Anxiety    Vitamin D  deficiency     Current Outpatient Medications  Medication Sig Dispense Refill   Ascorbic Acid (VITAMIN C) 100 MG tablet Take 100 mg by mouth daily.     cholecalciferol (VITAMIN D3) 25 MCG (1000 UT) tablet Take 4,000 Units by mouth daily.      fexofenadine (ALLEGRA) 180 MG tablet Take 180 mg by mouth daily.     ibuprofen (ADVIL,MOTRIN) 200 MG tablet Take 200  mg by mouth as needed.     Multiple Vitamin (MULTIVITAMIN) capsule Take 2 capsules by mouth daily.     OVER THE COUNTER MEDICATION Coleus Forte     OVER THE COUNTER MEDICATION Gymnema     No current facility-administered medications for this visit.    Allergies  Allergen Reactions   Zoloft  [Sertraline ] Other (See Comments)    Chest pain, nausea, vomiting   Egg-Derived Products    Ciprofloxacin  Other (See Comments)    Pt states it caused tingling in hands and feet.    Flagyl  [Metronidazole ] Nausea And Vomiting    Family History  Problem Relation Age of Onset   Hypertension Father    Hypertension Brother    Hypertension Paternal Grandmother    Stroke Paternal Grandmother    Heart disease Paternal Grandmother    Diabetes Paternal Grandmother    Breast cancer Neg Hx     Social History   Socioeconomic History   Marital status: Married    Spouse name: Not on file   Number of children: Not on file   Years of education: Not on file   Highest education level: Master's degree (e.g., MA, MS, MEng, MEd, MSW, MBA)  Occupational History   Not on file  Tobacco Use   Smoking status: Never   Smokeless tobacco: Never  Vaping Use   Vaping status: Never Used  Substance and Sexual Activity   Alcohol use: Yes    Alcohol/week: 7.0 standard drinks of alcohol    Types: 7 Glasses of wine per week    Comment: occasional   Drug use: No   Sexual activity: Yes    Birth control/protection: I.U.D.  Other Topics Concern   Not on file  Social History Narrative   Not on file   Social Drivers of Health   Financial Resource Strain: Low Risk  (10/06/2023)   Overall Financial Resource Strain (CARDIA)    Difficulty of Paying Living Expenses: Not hard at all  Food Insecurity: No Food Insecurity (10/06/2023)   Hunger Vital Sign    Worried About Running Out of Food in the Last Year: Never true    Ran Out of Food in the Last Year: Never true  Transportation Needs: No Transportation Needs  (10/06/2023)   PRAPARE - Administrator, Civil Service (Medical): No    Lack of Transportation (Non-Medical): No  Physical Activity: Sufficiently Active (10/06/2023)   Exercise Vital Sign    Days of Exercise per Week: 5 days    Minutes of Exercise per Session: 30 min  Stress: Stress Concern Present (10/06/2023)   [employer] of Occupational Health - Occupational Stress Questionnaire    Feeling of Stress : To some extent  Social Connections: Moderately Isolated (10/06/2023)   Social Connection and Isolation Panel [NHANES]    Frequency of Communication with Friends and Family: Once a week    Frequency of Social Gatherings with Friends and Family: More than three times a week    Attends Religious Services: Never    Database administrator or Organizations: No    Attends Engineer, structural: Not on file    Marital Status: Married  Catering manager Violence: Not on file     Constitutional: Denies fever, malaise, fatigue, headache or abrupt weight changes.  Respiratory: Denies difficulty breathing, shortness of breath, cough or sputum production.   Cardiovascular: Denies chest pain, chest tightness, palpitations or swelling in the hands or feet.  Musculoskeletal: Patient reports decreased flexion in the 1st through 5th fingers on the left hand along with swelling.  Denies decrease in range of motion, difficulty with gait, muscle pain or joint pain and swelling.  Skin: Patient reports bruising to 3rd and 4th fingers on her left hand, healing laceration to left fifth finger, open wound to the index finger.  Denies redness, rashes, lesions or ulcercations.  Neurological: Patient reports numbness in the second and 5th finger on the left hand.  Denies dizziness, difficulty with memory, difficulty with speech or problems with balance and coordination.   No other specific complaints in a complete review of systems (except as listed in HPI above).      Objective:   Physical  Exam  BP 112/74 (BP Location: Right Arm, Patient Position: Sitting, Cuff Size: Normal)   Ht 5\' 6"  (1.676 m)   Wt 219 lb 3.2 oz (99.4 kg)   BMI 35.38 kg/m   Wt Readings from Last 3 Encounters:  10/10/23 198 lb 9.6 oz (90.1 kg)  09/14/22 239 lb (108.4 kg)  06/30/22 226 lb 6.4 oz (102.7 kg)    General: Appears her stated age, obese in NAD. Skin: V-shaped laceration on the palmar aspect of the left fifth finger overlying the middle phalanx.  V-shaped open wound overlying the DIP of the left index finger.  Bruising noted of the 3rd and 4th fingers. Cardiovascular: Normal rate.  Radial  pulse 2+ on the left.  Cap refill less than 3 seconds. Pulmonary/Chest: Normal effort. Musculoskeletal: Decreased flexion of the fingers of the left hand.  Normal extension of the fingers of left hand.  1+ swelling noted of the 2nd, 3rd and 4th fingers of left hand.  No difficulty with gait.  Neurological: Alert and oriented.   BMET    Component Value Date/Time   NA 142 10/10/2023 0912   K 4.4 10/10/2023 0912   CL 104 10/10/2023 0912   CO2 29 10/10/2023 0912   GLUCOSE 84 10/10/2023 0912   BUN 18 10/10/2023 0912   CREATININE 0.80 10/10/2023 0912   CALCIUM 10.3 (H) 10/10/2023 0912   GFRNONAA >60 01/07/2018 1233   GFRAA >60 01/07/2018 1233    Lipid Panel     Component Value Date/Time   CHOL 185 10/10/2023 0912   TRIG 72 10/10/2023 0912   HDL 53 10/10/2023 0912   CHOLHDL 3.5 10/10/2023 0912   VLDL 23.6 11/26/2013 1421   LDLCALC 116 (H) 10/10/2023 0912    CBC    Component Value Date/Time   WBC 6.6 10/10/2023 0912   RBC 4.54 10/10/2023 0912   HGB 14.7 10/10/2023 0912   HCT 43.6 10/10/2023 0912   PLT 253 10/10/2023 0912   MCV 96.0 10/10/2023 0912   MCH 32.4 10/10/2023 0912   MCHC 33.7 10/10/2023 0912   RDW 11.8 10/10/2023 0912   LYMPHSABS 1.9 01/07/2018 1233   MONOABS 0.9 01/07/2018 1233   EOSABS 0.3 01/07/2018 1233   BASOSABS 0.0 01/07/2018 1233    Hgb A1C Lab Results  Component  Value Date   HGBA1C 5.5 10/10/2023            Assessment & Plan:  Assessment and Plan    Crush injury to fingers with sutures ER notes and imaging reviewed Crush injury with sutures in index finger. No fractures. Swelling and bruising present. No infection signs. Tetanus up to date. Nerve pain likely from sutures and injury. - Remove sutures today. - Encourage gentle finger movements to prevent tendon fibrosis, avoiding extreme pain. - Apply Neosporin and cover with Band-Aid when using hands. - Allow wound to air dry for a few hours daily. - Avoid immersing hand in water until healed. - Advise against using a stress ball until more mobility is achieved.  Nerve issues in fingers post-injury Nerve issues in index finger and pinky post-injury. Recovery expected to take months to a year. Good blood flow, significant scarring anticipated. - Monitor nerve recovery over the coming months. - Avoid excessive strain on fingers to prevent further nerve damage.        RTC in 2 months for follow-up of chronic conditions Helayne Lo, NP

## 2024-01-17 NOTE — Patient Instructions (Signed)
Suture Removal, Care After The following information offers guidance on how to care for yourself after your procedure. Your health care provider may also give you more specific instructions. If you have problems or questions, contact your health care provider. What can I expect after the procedure? After your stitches (sutures) are removed, it is common to have: Some discomfort and swelling in the area. Slight redness in the area. Follow these instructions at home: If you have a dressing: Wash your hands with soap and water for at least 20 seconds before and after you change your bandage (dressing). If soap and water are not available, use hand sanitizer. Change your dressing as told by your health care provider. If your dressing becomes wet or dirty, or develops a bad smell, change it as soon as possible. If your dressing sticks to your skin, pour warm, clean water over it until it loosens and can be removed without pulling apart the wound edges. Pat the area dry with a soft, clean towel. Do not rub the wound because that may cause bleeding. Wound care  Check your wound every day for signs of infection. Check for: More redness, swelling, or pain. Fluid or blood. New warmth, a rash, or hardness at the wound site. Pus or a bad smell. Wash your hands with soap and water for at least 20 seconds before and after touching your wound. If soap and water are not available, use hand sanitizer. Keep the wound area dry and clean. Clean and pat the wound dry as told by your health care provider. Apply cream or ointment only as told by your health care provider. If skin glue or adhesive strips were applied after sutures were removed, leave these closures in place. They may need to stay in place for 2 weeks or longer. If adhesive strip edges start to loosen and curl up, you may trim the loose edges. Do not remove adhesive strips completely unless your health care provider tells you to do that. Continue to  protect the wound from injury. Do not pick at your wound. Picking can cause an infection. Bathing Do not take baths, swim, or use a hot tub until your health care provider approves. Ask your health care provider if you may take showers. Follow these steps for showering: If you have a dressing, remove it before getting into the shower. In the shower, allow soapy water to get on the wound. Avoid scrubbing the wound. When you get out of the shower, dry the wound by patting it with a clean towel. Reapply a dressing over the wound, if needed. Scar care When your wound has completely healed, help decrease the size of your scar by: Wearing sunscreen over the scar or covering it with clothing when you are outside. New scars get sunburned easily, which can make scarring worse. Gently massaging the scarred area. This can decrease scar thickness. General instructions Take over-the-counter and prescription medicines only as told by your health care provider. Keep all follow-up visits. This is important. Contact a health care provider if: You have more redness, swelling, or pain around your wound. You have fluid or blood coming from your wound. You have new warmth, a rash, or hardness at the wound site. You have pus or a bad smell coming from your wound. Your wound opens up. Get help right away if: You have a fever or chills. You have red streaks coming from your wound. Summary After your sutures are removed, it is common to have some discomfort  and swelling in the area. Wash your hands with soap and water before you change your bandage (dressing). Keep the wound area dry and clean. Do not take baths, swim, or use a hot tub until your health care provider approves. This information is not intended to replace advice given to you by your health care provider. Make sure you discuss any questions you have with your health care provider. Document Revised: 12/23/2020 Document Reviewed:  12/23/2020 Elsevier Patient Education  2024 [employer].

## 2024-02-13 ENCOUNTER — Ambulatory Visit: Attending: Internal Medicine

## 2024-02-13 DIAGNOSIS — R278 Other lack of coordination: Secondary | ICD-10-CM | POA: Diagnosis present

## 2024-02-13 DIAGNOSIS — M6281 Muscle weakness (generalized): Secondary | ICD-10-CM | POA: Diagnosis present

## 2024-02-13 DIAGNOSIS — S6722XA Crushing injury of left hand, initial encounter: Secondary | ICD-10-CM | POA: Diagnosis present

## 2024-02-13 NOTE — Therapy (Signed)
 OUTPATIENT OCCUPATIONAL THERAPY ORTHO EVALUATION  Patient Name: Lori Huber MRN: [id number] DOB:1976-06-12, 48 y.o., female Today's Date: 02/14/2024  PCP: Helayne Lo, NP REFERRING PROVIDER: Helayne Lo, NP  END OF SESSION:  OT End of Session - 02/14/24 0926     Visit Number 1    Number of Visits 24    Date for OT Re-Evaluation 05/07/24    OT Start Time 0930    OT Stop Time 1015    OT Time Calculation (min) 45 min    Activity Tolerance Patient tolerated treatment well    Behavior During Therapy [facility] for tasks assessed/performed            Past Medical History:  Diagnosis Date   Anxiety    Vitamin D  deficiency    Past Surgical History:  Procedure Laterality Date   CHOLECYSTECTOMY  2011   KIDNEY STONE SURGERY  1996   KNEE SURGERY Left    Patient Active Problem List   Diagnosis Date Noted   Prediabetes 10/10/2023   Pure hypercholesterolemia 09/14/2022   Class 1 obesity due to excess calories with body mass index (BMI) of 32.0 to 32.9 in adult 09/14/2022   Generalized anxiety disorder 07/13/2019   ONSET DATE: 01/07/24  REFERRING DIAG: I69.8XXD (ICD-10-CM) - Problem with rehabilitation of hand condition, subsequent encounter   THERAPY DIAG: Muscle weakness (generalized), other lack of coordination, crushing injury of L hand, initial encounter  Rationale for Evaluation and Treatment: Rehabilitation  SUBJECTIVE:  SUBJECTIVE STATEMENT: "I've started to use my L thumb and middle and ring finger better, but I can't use my index and little finger yet."  Pt accompanied by: self  PERTINENT HISTORY: Per chart: ED  HPI 48 year old presents emergency department today with finger laceration to the left small finger and crush injury after she crushed her finger in the treadmill that she is moving.   PCP: Crush injury to fingers with sutures ER notes and imaging reviewed Crush injury with sutures in index finger. No fractures. Swelling and bruising present. No  infection signs. Tetanus up to date. Nerve pain likely from sutures and injury. - Remove sutures today. - Encourage gentle finger movements to prevent tendon fibrosis, avoiding extreme pain. - Apply Neosporin and cover with Band-Aid when using hands. - Allow wound to air dry for a few hours daily. - Avoid immersing hand in water until healed. - Advise against using a stress ball until more mobility is achieved.   Nerve issues in fingers post-injury Nerve issues in index finger and pinky post-injury. Recovery expected to take months to a year. Good blood flow, significant scarring anticipated. - Monitor nerve recovery over the coming months. - Avoid excessive strain on fingers to prevent further nerve damage.   PRECAUTIONS: Other: avoid excessive strain to fingers to prevent further nerve damage; monitor for s/s of infection as wounds heal  RED FLAGS: None   WEIGHT BEARING RESTRICTIONS: No  PAIN:  Are you having pain? Yes: NPRS scale: 1-2 at rest, up to 6/10 with activity  Pain location: L hand digits 2-5, IF and SF the most sore, soreness at the dorsal and volar MCPs Pain description: Sore, tingly, stiff, achy, sharp if stretching, tingling mostly in the dorsal digits and hand, minimal tingling on the volar side of the hand   Aggravating factors: activity  Relieving factors: rest and ibuprofen   FALLS: Has patient fallen in last 6 months? 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  HAND DOMINANCE: Right  ADLs: Eating: difficulty cutting meat; difficulty stabilizing the fork in L hand with digits 1, 3, and  4 Grooming: using only L thumb, LF, and RF to tie up hair in pony tail; not yet able to pinch a hair clip Upper body dressing: must front hook bra currently, baseline is back hooking but not yet able  Lower body dressing: unable to tie shoe laces tightly  Toileting: predominantly hiking/lowering pants with little engagement of L hand, using thumb only  Bathing: washing hair mostly 1 handed, if L hand is engaged only L thumb, LF, and RF are used  Tub shower transfers: indep/no difficulties  FUNCTIONAL OUTCOME MEASURES: Upper Extremity Functional Scale (UEFS): 56/80  UPPER EXTREMITY ROM:     Active ROM Right eval Left eval  Wrist flexion WNL WNL  Wrist extension WNL WNL  Wrist ulnar deviation WNL WNL  Wrist radial deviation WNL WNL  Wrist pronation WNL WNL  Wrist supination WNL WNL  (Blank rows = not tested)  L hand presents with full extension in each digit; measures below note flexion limitations:  Active ROM Right eval Left eval  Thumb MCP (0-60)    Thumb IP (0-80)    Thumb Radial abd/add (0-55)     Thumb Palmar abd/add (0-45)     Thumb Opposition to Small Finger     Index MCP (0-90)   70 (90)  Index PIP (0-100)   60 (5)  Index DIP (0-70)   20 (45)  Long MCP (0-90)      Long PIP (0-100)      Long DIP (0-70)      Ring MCP (0-90)      Ring PIP (0-100)      Ring DIP (0-70)      Little MCP (0-90)   30 (70)  Little PIP (0-100)    20 (40)  Little DIP (0-70)    10 (25)  (Blank rows = not tested)  L hand able to oppose all digits to thumb with soreness in the L SF L IF tip to palm: 7 cm; L SF unable to initiate sufficient flexion at the PIP to attempt tip to palm   UPPER EXTREMITY MMT: Proximal BUEs WNL; see below for hand weakness   HAND FUNCTION: Grip strength: Right: 65 lbs; Left: 13 lbs, Lateral pinch: Right: 13 lbs, Left: 10 lbs, and 3 point pinch: Right: 14 lbs, Left: 5 lbs (Grip strength measured engaging only the L thumb, 3rd, and 4th digits, and lateral  pinch measured with extended IF)  COORDINATION: 9 Hole Peg test: Right: 17 sec; Left: 26 sec  SENSATION: Tingling/numbness in the dorsal aspect of digits of L hand (IF and SF most severe), less tingling on volar aspect of all digits   EDEMA: Circumferential: L IF measured between PIP and MCP joints 7.2 cm, R 7.0 cm         L SF measured between PIP and MCP joints 6.4 cm , L 5.8 cm         Pt reports being newly able to wear her wedding ring on the L RF, but it feels tight  COGNITION: Overall cognitive status: Within functional limits for tasks assessed  OBSERVATIONS:  L IF suture site healing without s/s of infection, eschar present L LF with eschar present at the L SF DIP at the ulnar/volar aspect and without s/s of infection.   TREATMENT DATE: 02/13/24 Evaluation completed.  Therapeutic Exercise: Initiated HEP: L hand digit opposition, working active to passive to meet tip to palm, active/passive fisting with focus on PIP and DIP flexion of all digits.  Pt able to return demo with min vc for technique.                                                                                                                            PATIENT EDUCATION: Education details: OT role, goals, poc, HEP  Person educated: Patient Education method: Explanation, Demonstration, and Verbal cues Education comprehension: verbalized understanding, returned demonstration, verbal cues required, and needs further education  HOME EXERCISE PROGRAM: L hand digit opposition, AROM/PROM of digits 1-5 within tolerable (minimal pain) range  GOALS: Goals reviewed with patient? Yes  SHORT TERM GOALS: Target date: 03/26/24  Pt will be indep to perform HEP for improving L hand flexibility, strength, and coordination. Baseline: Eval: HEP initiated this date; further training and progression in upcoming visits Goal status: INITIAL  2.  Pt will verbalize and implement 1-2 edema management strategies to reduce edema in  L hand digits.  Baseline: Eval: Pt currently elevating hand and making attempts at AROM; further training needed Goal status: INITIAL  LONG TERM GOALS: Target date: 05/07/24  Pt will increase MAM-20 score for musculoskeletal conditions by 10 or more points to indicate improvement in self perceived functional use of the L hand for daily tasks.  Baseline: Eval: 56/80 Goal status: INITIAL  2.  Pt will increase L hand flexibility to enable closed fist of all digits for securely holding and transporting light ADL supplies without dropping. Baseline: Eval: Pt unable to make a fist d/t flexion limitations in L IF and SF (see above measures) Goal status: INITIAL  3.  Pt will tolerate manual therapy, therapeutic modalities, and exercises to decrease pain in L hand to a reported 2/10 pain or less with activity.  Baseline: Eval: 6/10 pain with activity (1-2/10 at rest) Goal status: INITIAL  4.  Pt will increase L grip strength by 10 or more lbs to securely pick up and sip from a cup with L hand. Baseline: Eval: L grip 13 lbs (R 65 lbs) Goal status: INITIAL  5.  Pt will increase L lateral pinch strength by 3 or more lbs to enable pt to stabilize a fork in L hand for cutting meat with greater ease. Baseline: Eval: L 10 lbs (R 13 lbs) Goal status: INITIAL  ASSESSMENT:  CLINICAL IMPRESSION: Patient is a 48 y.o. female who was seen today for occupational therapy evaluation for functional decline related to recent crush injury to L hand.  Pt presents with moderate pain in all digits of L  hand, with digits 2 and 5 most effected with tingling/numbness, swelling, and limitations in flexibility and strength.  Sutures removed and eschar present at suture site and tip of volar/ulnar aspect of DIP in the L 5th digit with no s/s of infection noted.  Pt endorses inability to engage the L IF and SF into daily tasks, and has been compensating with the L thumb and digits 3 and 4 when performing bimanual components of  ADLs.  Pt's goal is to improve pain, swelling, flexibility, and strength in order to improve functional use of the L hand with daily tasks.  Pt will benefit from skilled OT to address above noted deficits in order to meet goals noted above.  Pt in agreement with plan.  PERFORMANCE DEFICITS: in functional skills including ADLs, IADLs, coordination, dexterity, sensation, edema, ROM, strength, pain, fascial restrictions, flexibility, Fine motor control, decreased knowledge of precautions, decreased knowledge of use of DME, wound, skin integrity, and UE functional use, and psychosocial skills including coping strategies and routines and behaviors.   IMPAIRMENTS: are limiting patient from ADLs, IADLs, rest and sleep, work, and leisure.   COMORBIDITIES: may have co-morbidities  that affects occupational performance. Patient will benefit from skilled OT to address above impairments and improve overall function.  MODIFICATION OR ASSISTANCE TO COMPLETE EVALUATION: No modification of tasks or assist necessary to complete an evaluation.  OT OCCUPATIONAL PROFILE AND HISTORY: Detailed assessment: Review of records and additional review of physical, cognitive, psychosocial history related to current functional performance.  CLINICAL DECISION MAKING: Moderate - several treatment options, min-mod task modification necessary  REHAB POTENTIAL: Good  EVALUATION COMPLEXITY: Moderate      PLAN:  OT FREQUENCY: 2x/week  OT DURATION: 12 weeks  PLANNED INTERVENTIONS: 97168 OT Re-evaluation, 97535 self care/ADL training, 40102 therapeutic exercise, 97530 therapeutic activity, 97112 neuromuscular re-education, 97140 manual therapy, 97018 paraffin, 72536 moist heat, 97010 cryotherapy, 97034 contrast bath, 97760 Orthotic Initial, 97763 Orthotic/Prosthetic subsequent, scar mobilization, passive range of motion, psychosocial skills training, coping strategies training, patient/family education, and DME and/or AE  instructions  RECOMMENDED OTHER SERVICES: None at this time  CONSULTED AND AGREED WITH PLAN OF CARE: Patient  PLAN FOR NEXT SESSION: Initiate tx  Marcus Sewer, MS, OTR/L  Casandra Claw, OT 02/14/2024, 9:28 AM

## 2024-02-17 ENCOUNTER — Ambulatory Visit

## 2024-02-17 DIAGNOSIS — R278 Other lack of coordination: Secondary | ICD-10-CM

## 2024-02-17 DIAGNOSIS — M6281 Muscle weakness (generalized): Secondary | ICD-10-CM

## 2024-02-17 DIAGNOSIS — S6722XA Crushing injury of left hand, initial encounter: Secondary | ICD-10-CM

## 2024-02-17 NOTE — Therapy (Signed)
 OUTPATIENT OCCUPATIONAL THERAPY ORTHO EVALUATION  Patient Name: Lori Huber MRN: [id number] DOB:06/19/1976, 48 y.o., female Today's Date: 02/17/2024  PCP: Helayne Lo, NP REFERRING PROVIDER: Helayne Lo, NP  END OF SESSION:  OT End of Session - 02/17/24 0840     Visit Number 2    Number of Visits 24    Date for OT Re-Evaluation 05/07/24    OT Start Time 0845    OT Stop Time 0930    OT Time Calculation (min) 45 min    Activity Tolerance Patient tolerated treatment well    Behavior During Therapy [facility] for tasks assessed/performed            Past Medical History:  Diagnosis Date   Anxiety    Vitamin D  deficiency    Past Surgical History:  Procedure Laterality Date   CHOLECYSTECTOMY  2011   KIDNEY STONE SURGERY  1996   KNEE SURGERY Left    Patient Active Problem List   Diagnosis Date Noted   Prediabetes 10/10/2023   Pure hypercholesterolemia 09/14/2022   Class 1 obesity due to excess calories with body mass index (BMI) of 32.0 to 32.9 in adult 09/14/2022   Generalized anxiety disorder 07/13/2019   ONSET DATE: 01/07/24  REFERRING DIAG: W09.8XXD (ICD-10-CM) - Problem with rehabilitation of hand condition, subsequent encounter   THERAPY DIAG: Muscle weakness (generalized), other lack of coordination, crushing injury of L hand, initial encounter  Rationale for Evaluation and Treatment: Rehabilitation  SUBJECTIVE:  SUBJECTIVE STATEMENT: Pt reports increased pain after exercising L digits more this week.  Pt accompanied by: self  PERTINENT HISTORY: Per chart: ED  HPI 48 year old presents emergency department today with finger laceration to the left small finger and crush injury after she crushed her finger in the treadmill that she is moving.   PCP: Crush injury to fingers with sutures ER notes and imaging reviewed Crush injury with sutures in index finger. No fractures. Swelling and bruising present. No infection signs. Tetanus up to date. Nerve pain  likely from sutures and injury. - Remove sutures today. - Encourage gentle finger movements to prevent tendon fibrosis, avoiding extreme pain. - Apply Neosporin and cover with Band-Aid when using hands. - Allow wound to air dry for a few hours daily. - Avoid immersing hand in water until healed. - Advise against using a stress ball until more mobility is achieved.   Nerve issues in fingers post-injury Nerve issues in index finger and pinky post-injury. Recovery expected to take months to a year. Good blood flow, significant scarring anticipated. - Monitor nerve recovery over the coming months. - Avoid excessive strain on fingers to prevent further nerve damage.   PRECAUTIONS: Other: avoid excessive strain to fingers to prevent further nerve damage; monitor for s/s of infection as wounds heal  RED FLAGS: None   WEIGHT BEARING RESTRICTIONS: No  PAIN:  Are you having pain? Yes: NPRS scale: 1-2 at rest, up to 6/10 with activity  Pain location: L hand digits 2-5, IF and SF the most sore, soreness at the dorsal and volar MCPs Pain description: Sore, tingly, stiff, achy, sharp if stretching, tingling mostly in the dorsal digits and hand, minimal tingling on the volar side of the hand   Aggravating factors: activity  Relieving factors: rest and ibuprofen   FALLS: Has patient fallen in last 6 months? 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  HAND DOMINANCE: Right  ADLs: Eating: difficulty cutting meat; difficulty stabilizing the fork in L hand with digits 1, 3, and 4 Grooming: using only L thumb, LF, and RF to tie up  hair in pony tail; not yet able to pinch a hair clip Upper body dressing: must front hook bra currently, baseline is back hooking but not yet able  Lower body dressing: unable to tie shoe laces tightly  Toileting: predominantly hiking/lowering pants with little engagement of L hand, using thumb only  Bathing: washing hair mostly 1 handed, if L hand is engaged only L thumb, LF, and RF are used  Tub shower transfers: indep/no difficulties  FUNCTIONAL OUTCOME MEASURES: Upper Extremity Functional Scale (UEFS): 56/80  UPPER EXTREMITY ROM:     Active ROM Right eval Left eval  Wrist flexion WNL WNL  Wrist extension WNL WNL  Wrist ulnar deviation WNL WNL  Wrist radial deviation WNL WNL  Wrist pronation WNL WNL  Wrist supination WNL WNL  (Blank rows = not tested)  L hand presents with full extension in each digit; measures below note flexion limitations:  Active ROM Right eval Left eval  Thumb MCP (0-60)    Thumb IP (0-80)    Thumb Radial abd/add (0-55)     Thumb Palmar abd/add (0-45)     Thumb Opposition to Small Finger     Index MCP (0-90)   70 (90)  Index PIP (0-100)   60 (5)  Index DIP (0-70)   20 (45)  Long MCP (0-90)      Long PIP (0-100)      Long DIP (0-70)      Ring MCP (0-90)      Ring PIP (0-100)      Ring DIP (0-70)      Little MCP (0-90)   30 (70)  Little PIP (0-100)    20 (40)  Little DIP (0-70)    10 (25)  (Blank rows = not tested)  L hand able to oppose all digits to thumb with soreness in the L SF L IF tip to palm: 7 cm; L SF unable to initiate sufficient flexion at the PIP to attempt tip to palm   UPPER EXTREMITY MMT: Proximal BUEs WNL; see below for hand weakness   HAND FUNCTION: Grip strength: Right: 65 lbs; Left: 13 lbs, Lateral pinch: Right: 13 lbs, Left: 10 lbs, and 3 point pinch: Right: 14 lbs, Left: 5 lbs (Grip strength measured engaging only the L thumb, 3rd, and 4th digits, and lateral pinch measured with extended IF)  COORDINATION: 9 Hole  Peg test: Right: 17 sec; Left: 26 sec  SENSATION: Tingling/numbness in the dorsal aspect of digits of L hand (IF and SF most severe), less tingling on volar aspect of all digits   EDEMA: Circumferential: L IF measured between PIP and MCP joints 7.2 cm, R 7.0 cm         L SF measured between PIP and MCP joints 6.4 cm , L 5.8 cm         Pt reports being newly able to wear her wedding ring on the L RF, but it feels tight  COGNITION: Overall cognitive status: Within functional limits for tasks assessed  OBSERVATIONS:  L IF suture site healing without s/s of infection, eschar present L LF with eschar present at the L SF DIP at the ulnar/volar aspect and without s/s of infection.   TREATMENT DATE: 02/17/24   Therapeutic Exercise: -Tolerated L hand digit opposition, working active to passive to meet tip to palm -A/PROM all joints of 2nd and fifth digits.  -ice pack for pain mgmt - educated on pain mgmt techniques including ice, decreasing volume of exercises as needed for pain, premedicating for exercises, and avoiding elbow resting on table to minimize ulnar nerve compression.   Manual Therapy:  -soft tissue massage for edema mgmt and improved ROM (significant increased L 5th digit AROM noted following massage) -educated on edema mgmt strategies.   PATIENT EDUCATION: Education details: OT role, goals, poc, HEP  Person educated: Patient Education method: Explanation, Demonstration, and Verbal cues Education comprehension: verbalized understanding, returned demonstration, verbal cues required, and needs further education  HOME EXERCISE PROGRAM: L hand digit opposition, AROM/PROM of digits 1-5 within tolerable (minimal pain) range  GOALS: Goals reviewed with patient? Yes  SHORT TERM GOALS: Target date: 03/26/24  Pt will be indep to perform HEP for improving L hand flexibility, strength, and coordination. Baseline: Eval: HEP initiated this date; further training and progression in  upcoming visits Goal status: INITIAL  2.  Pt will verbalize and implement 1-2 edema management strategies to reduce edema in L hand digits.  Baseline: Eval: Pt currently elevating hand and making attempts at AROM; further training needed Goal status: INITIAL  LONG TERM GOALS: Target date: 05/07/24  Pt will increase MAM-20 score for musculoskeletal conditions by 10 or more points to indicate improvement in self perceived functional use of the L hand for daily tasks.  Baseline: Eval: 56/80 Goal status: INITIAL  2.  Pt will increase L hand flexibility to enable closed fist of all digits for securely holding and transporting light ADL supplies without dropping. Baseline: Eval: Pt unable to make a fist d/t flexion limitations in L IF and SF (see above measures) Goal status: INITIAL  3.  Pt will tolerate manual therapy, therapeutic modalities, and exercises to decrease pain in L hand to a reported 2/10 pain or less with activity.  Baseline: Eval: 6/10 pain with activity (1-2/10 at rest) Goal status: INITIAL  4.  Pt will increase L grip strength by 10 or more lbs to securely pick up and sip from a cup with L hand. Baseline: Eval: L grip 13 lbs (R 65 lbs) Goal status: INITIAL  5.  Pt will increase L lateral pinch strength by 3 or more lbs to enable pt to stabilize a fork in L hand for cutting meat with greater ease. Baseline: Eval: L 10 lbs (R 13 lbs) Goal status: INITIAL  ASSESSMENT:  CLINICAL IMPRESSION: Pt states she can use L hand to pick up water bottle but unable to carry distance due to fatigue and reports increased pain with exercises this week. Pt educated on edema mgmt, pain mgmt strategies, and HEP. Good recall of HEP and fair tolerance of edema massage (increased pain 4/10). Pt continues to report difficulty engaging the L 2nd and 5th digits into daily tasks due to pain and ROM limitations. Pt will benefit from skilled OT to address above noted deficits in order to meet goals noted  above.   PERFORMANCE DEFICITS: in functional skills including ADLs, IADLs, coordination, dexterity, sensation, edema, ROM, strength, pain, fascial restrictions, flexibility, Fine motor control, decreased knowledge of precautions, decreased knowledge of use of  DME, wound, skin integrity, and UE functional use, and psychosocial skills including coping strategies and routines and behaviors.   IMPAIRMENTS: are limiting patient from ADLs, IADLs, rest and sleep, work, and leisure.   COMORBIDITIES: may have co-morbidities  that affects occupational performance. Patient will benefit from skilled OT to address above impairments and improve overall function.  MODIFICATION OR ASSISTANCE TO COMPLETE EVALUATION: No modification of tasks or assist necessary to complete an evaluation.  OT OCCUPATIONAL PROFILE AND HISTORY: Detailed assessment: Review of records and additional review of physical, cognitive, psychosocial history related to current functional performance.  CLINICAL DECISION MAKING: Moderate - several treatment options, min-mod task modification necessary  REHAB POTENTIAL: Good  EVALUATION COMPLEXITY: Moderate      PLAN:  OT FREQUENCY: 2x/week  OT DURATION: 12 weeks  PLANNED INTERVENTIONS: 97168 OT Re-evaluation, 97535 self care/ADL training, 95638 therapeutic exercise, 97530 therapeutic activity, 97112 neuromuscular re-education, 97140 manual therapy, 97018 paraffin, 75643 moist heat, 97010 cryotherapy, 97034 contrast bath, 97760 Orthotic Initial, 97763 Orthotic/Prosthetic subsequent, scar mobilization, passive range of motion, psychosocial skills training, coping strategies training, patient/family education, and DME and/or AE instructions  RECOMMENDED OTHER SERVICES: None at this time  CONSULTED AND AGREED WITH PLAN OF CARE: Patient  PLAN FOR NEXT SESSION: A/PROM  Gordan Latina, M.S. OTR/L  02/17/24, 8:41 AM  ascom [phone]   Alfonzo Ill, OT 02/17/2024, 8:41 AM

## 2024-02-20 ENCOUNTER — Ambulatory Visit

## 2024-02-20 DIAGNOSIS — S6722XA Crushing injury of left hand, initial encounter: Secondary | ICD-10-CM

## 2024-02-20 DIAGNOSIS — M6281 Muscle weakness (generalized): Secondary | ICD-10-CM

## 2024-02-20 DIAGNOSIS — R278 Other lack of coordination: Secondary | ICD-10-CM

## 2024-02-20 NOTE — Therapy (Signed)
 OUTPATIENT OCCUPATIONAL THERAPY ORTHO TREATMENT NOTE  Patient Name: Lori Huber MRN: [id number] DOB:[birth year], 48 y.o., female Today's Date: 02/20/2024  PCP: Helayne Lo, NP REFERRING PROVIDER: Helayne Lo, NP  END OF SESSION:  OT End of Session - 02/20/24 1347     Visit Number 3    Number of Visits 24    Date for OT Re-Evaluation 05/07/24    OT Start Time 1015    OT Stop Time 1100    OT Time Calculation (min) 45 min    Activity Tolerance Patient tolerated treatment well    Behavior During Therapy [facility] for tasks assessed/performed            Past Medical History:  Diagnosis Date   Anxiety    Vitamin D  deficiency    Past Surgical History:  Procedure Laterality Date   CHOLECYSTECTOMY  2011   KIDNEY STONE SURGERY  1996   KNEE SURGERY Left    Patient Active Problem List   Diagnosis Date Noted   Prediabetes 10/10/2023   Pure hypercholesterolemia 09/14/2022   Class 1 obesity due to excess calories with body mass index (BMI) of 32.0 to 32.9 in adult 09/14/2022   Generalized anxiety disorder 07/13/2019   ONSET DATE: 01/07/24  REFERRING DIAG: I69.8XXD (ICD-10-CM) - Problem with rehabilitation of hand condition, subsequent encounter   THERAPY DIAG: Muscle weakness (generalized), other lack of coordination, crushing injury of L hand, initial encounter  Rationale for Evaluation and Treatment: Rehabilitation  SUBJECTIVE:  SUBJECTIVE STATEMENT: Pt reports more movement in the L IF since last week, but still quite limited in the SF.  Pt accompanied by: self  PERTINENT HISTORY: Per chart: ED  HPI 48 year old presents emergency department today with finger laceration to the left small finger and crush injury after she crushed her finger in the treadmill that she is moving.   PCP: Crush injury to fingers with sutures ER notes and imaging reviewed Crush injury with sutures in index finger. No fractures. Swelling and bruising present. No infection signs. Tetanus  up to date. Nerve pain likely from sutures and injury. - Remove sutures today. - Encourage gentle finger movements to prevent tendon fibrosis, avoiding extreme pain. - Apply Neosporin and cover with Band-Aid when using hands. - Allow wound to air dry for a few hours daily. - Avoid immersing hand in water until healed. - Advise against using a stress ball until more mobility is achieved.   Nerve issues in fingers post-injury Nerve issues in index finger and pinky post-injury. Recovery expected to take months to a year. Good blood flow, significant scarring anticipated. - Monitor nerve recovery over the coming months. - Avoid excessive strain on fingers to prevent further nerve damage.   PRECAUTIONS: Other: avoid excessive strain to fingers to prevent further nerve damage; monitor for s/s of infection as wounds heal  RED FLAGS: None   WEIGHT BEARING RESTRICTIONS: No  PAIN:  Are you having pain? Yes: NPRS scale: 1-2 at rest, up to 6/10 with activity  Pain location: L hand digits 2-5, IF and SF the most sore, soreness at the dorsal and volar MCPs Pain description: Sore, tingly, stiff, achy, sharp if stretching, tingling mostly in the dorsal digits and hand, minimal tingling on the volar side of the hand   Aggravating factors: activity  Relieving factors: rest and ibuprofen   FALLS: Has patient fallen in last 6 months? 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  HAND DOMINANCE: Right  ADLs: Eating: difficulty cutting meat; difficulty stabilizing the fork in L hand with digits 1, 3, and 4 Grooming: using only L thumb,  LF, and RF to tie up hair in pony tail; not yet able to pinch a hair clip Upper body dressing: must front hook bra currently, baseline is back hooking but not yet able  Lower body dressing: unable to tie shoe laces tightly  Toileting: predominantly hiking/lowering pants with little engagement of L hand, using thumb only  Bathing: washing hair mostly 1 handed, if L hand is engaged only L thumb, LF, and RF are used  Tub shower transfers: indep/no difficulties  FUNCTIONAL OUTCOME MEASURES: Upper Extremity Functional Scale (UEFS): 56/80  UPPER EXTREMITY ROM:     Active ROM Right eval Left eval  Wrist flexion WNL WNL  Wrist extension WNL WNL  Wrist ulnar deviation WNL WNL  Wrist radial deviation WNL WNL  Wrist pronation WNL WNL  Wrist supination WNL WNL  (Blank rows = not tested)  L hand presents with full extension in each digit; measures below note flexion limitations:  Active ROM Right eval Left eval  Thumb MCP (0-60)    Thumb IP (0-80)    Thumb Radial abd/add (0-55)     Thumb Palmar abd/add (0-45)     Thumb Opposition to Small Finger     Index MCP (0-90)   70 (90)  Index PIP (0-100)   60 (5)  Index DIP (0-70)   20 (45)  Long MCP (0-90)      Long PIP (0-100)      Long DIP (0-70)      Ring MCP (0-90)      Ring PIP (0-100)      Ring DIP (0-70)      Little MCP (0-90)   30 (70)  Little PIP (0-100)    20 (40)  Little DIP (0-70)    10 (25)  (Blank rows = not tested)  L hand able to oppose all digits to thumb with soreness in the L SF L IF tip to palm: 7 cm; L SF unable to initiate sufficient flexion at the PIP to attempt tip to palm   UPPER EXTREMITY MMT: Proximal BUEs WNL; see below for hand weakness   HAND FUNCTION: Grip strength: Right: 65 lbs; Left: 13 lbs, Lateral pinch: Right: 13 lbs, Left: 10 lbs, and 3 point pinch: Right: 14 lbs, Left: 5 lbs (Grip strength measured engaging only the L thumb, 3rd, and 4th digits, and lateral pinch measured with extended  IF)  COORDINATION: 9 Hole Peg test: Right: 17 sec; Left: 26 sec  SENSATION: Tingling/numbness in the dorsal aspect of digits of L hand (IF and SF most severe), less tingling on volar aspect of all digits   EDEMA: Circumferential: L IF measured between PIP and MCP joints 7.2 cm, R 7.0 cm         L SF measured between PIP and MCP joints 6.4 cm , L 5.8 cm         Pt reports being newly able to wear her wedding ring on the L RF, but it feels tight  COGNITION: Overall cognitive status:  Within functional limits for tasks assessed  OBSERVATIONS:  L IF suture site healing without s/s of infection, eschar present L LF with eschar present at the L SF DIP at the ulnar/volar aspect and without s/s of infection.   TREATMENT DATE: 02/20/24 Manual Therapy:  -soft tissue massage for edema mgmt and improved ROM -educated on edema mgmt strategies/reviewed self edema massage technique working proximal at the wrist, distal to the fingertips, and back to proximal to the wrist, always moving fluid away from fingertips.  Self Care: -Issued size medium open fingered compression glove.  Advised on wearing recommendations/care of glove; pt verbalized understanding and demos ability to don/doff glove independently. -Advised on contrast bath technique for management of pain/edema in L hand; visual handout issued. -Advised on indications for ice vs heat.  Encouraged heat prior to stretching, ice after therapeutic exercises to minimize pain/swelling.  Therapeutic Exercise: -Reps of gentle passive fisting digits 1-5 alternated by full digit ext  -Active digit opposition to thumb -Instructed in/completed joint blocking exercises for increasing passive and active flexion at the MP, PIP, and DIPs of L hand digits 2 and 5; good return demo with min vc/tactile cues. -Instructed in/completed tendon glides for L hand x3 reps; visual handout issued with good return demo; mod vc for technique. -Demonstrated/advised on  benefits of both P/A/AAROM to L hand digits for increasing mobility in L hand digits.  -Vc for use of visual feedback when practicing AROM to L hand digits to compensate for numbness in digits.  PATIENT EDUCATION: Education details: HEP progression Person educated: Patient Education method: Programmer, multimedia, Demonstration, and Verbal cues, visual handouts Education comprehension: verbalized understanding, returned demonstration, verbal cues required, tactile cues required, and needs further education  HOME EXERCISE PROGRAM: -L hand digit opposition, AROM/PROM of digits 1-5 within tolerable (minimal pain) range -don compression glove intermittently throughout the day -Digit blocking, tendon glides, A/P/AAROM L hand digits   GOALS: Goals reviewed with patient? Yes  SHORT TERM GOALS: Target date: 03/26/24  Pt will be indep to perform HEP for improving L hand flexibility, strength, and coordination. Baseline: Eval: HEP initiated this date; further training and progression in upcoming visits Goal status: INITIAL  2.  Pt will verbalize and implement 1-2 edema management strategies to reduce edema in L hand digits.  Baseline: Eval: Pt currently elevating hand and making attempts at AROM; further training needed Goal status: INITIAL  LONG TERM GOALS: Target date: 05/07/24  Pt will increase MAM-20 score for musculoskeletal conditions by 10 or more points to indicate improvement in self perceived functional use of the L hand for daily tasks.  Baseline: Eval: 56/80 Goal status: INITIAL  2.  Pt will increase L hand flexibility to enable closed fist of all digits for securely holding and transporting light ADL supplies without dropping. Baseline: Eval: Pt unable to make a fist d/t flexion limitations in L IF and SF (see above measures) Goal status: INITIAL  3.  Pt will tolerate manual therapy, therapeutic modalities, and exercises to decrease pain in L hand to a reported 2/10 pain or less with  activity.  Baseline: Eval: 6/10 pain with activity (1-2/10 at rest) Goal status: INITIAL  4.  Pt will increase L grip strength by 10 or more lbs to securely pick up and sip from a cup with L hand. Baseline: Eval: L grip 13 lbs (R 65 lbs) Goal status: INITIAL  5.  Pt will increase L lateral pinch strength by 3 or more lbs to enable pt to stabilize  a fork in L hand for cutting meat with greater ease. Baseline: Eval: L 10 lbs (R 13 lbs) Goal status: INITIAL  ASSESSMENT:  CLINICAL IMPRESSION: Pt reports more movement in the L IF since last week, but still quite limited in the SF.  Pain consistent at 3-4/10 during visit, mostly at the [facility] with end range passive flexion at the PIP and DIP joints.  Pt presents with guarding during gentle passive stretch to this digit, but responds well to moist heat and tactile and vc for relaxation of hand.  Pt demos good understanding of HEP progression as noted above.  Following edema massage, passive stretching, and active joint blocking exercises, pt was able to tolerate passive flexion of IF tip to palm.  Pt will continue to benefit from skilled OT to address L hand edema, pain, stiffness, lack of coordination, and weakness in order to work towards improved functional use of the L hand with daily tasks.   PERFORMANCE DEFICITS: in functional skills including ADLs, IADLs, coordination, dexterity, sensation, edema, ROM, strength, pain, fascial restrictions, flexibility, Fine motor control, decreased knowledge of precautions, decreased knowledge of use of DME, wound, skin integrity, and UE functional use, and psychosocial skills including coping strategies and routines and behaviors.   IMPAIRMENTS: are limiting patient from ADLs, IADLs, rest and sleep, work, and leisure.   COMORBIDITIES: may have co-morbidities  that affects occupational performance. Patient will benefit from skilled OT to address above impairments and improve overall function.  MODIFICATION OR  ASSISTANCE TO COMPLETE EVALUATION: No modification of tasks or assist necessary to complete an evaluation.  OT OCCUPATIONAL PROFILE AND HISTORY: Detailed assessment: Review of records and additional review of physical, cognitive, psychosocial history related to current functional performance.  CLINICAL DECISION MAKING: Moderate - several treatment options, min-mod task modification necessary  REHAB POTENTIAL: Good  EVALUATION COMPLEXITY: Moderate      PLAN:  OT FREQUENCY: 2x/week  OT DURATION: 12 weeks  PLANNED INTERVENTIONS: 97168 OT Re-evaluation, 97535 self care/ADL training, 40981 therapeutic exercise, 97530 therapeutic activity, 97112 neuromuscular re-education, 97140 manual therapy, 97018 paraffin, 19147 moist heat, 97010 cryotherapy, 97034 contrast bath, 97760 Orthotic Initial, 97763 Orthotic/Prosthetic subsequent, scar mobilization, passive range of motion, psychosocial skills training, coping strategies training, patient/family education, and DME and/or AE instructions  RECOMMENDED OTHER SERVICES: None at this time  CONSULTED AND AGREED WITH PLAN OF CARE: Patient  PLAN FOR NEXT SESSION: A/PROM  Casandra Claw, OT 02/20/2024, 1:48 PM

## 2024-02-22 ENCOUNTER — Ambulatory Visit

## 2024-02-22 DIAGNOSIS — M6281 Muscle weakness (generalized): Secondary | ICD-10-CM

## 2024-02-22 DIAGNOSIS — S6722XA Crushing injury of left hand, initial encounter: Secondary | ICD-10-CM

## 2024-02-22 DIAGNOSIS — R278 Other lack of coordination: Secondary | ICD-10-CM

## 2024-02-23 NOTE — Therapy (Signed)
 OUTPATIENT OCCUPATIONAL THERAPY ORTHO TREATMENT NOTE  Patient Name: Lori Huber MRN: [id number] DOB:September 19, 1975, 48 y.o., female Today's Date: 02/23/2024  PCP: Helayne Lo, NP REFERRING PROVIDER: Helayne Lo, NP  END OF SESSION:  OT End of Session - 02/23/24 1635     Visit Number 4    Number of Visits 24    Date for OT Re-Evaluation 05/07/24    OT Start Time 1015    OT Stop Time 1100    OT Time Calculation (min) 45 min    Activity Tolerance Patient tolerated treatment well    Behavior During Therapy [facility] for tasks assessed/performed         Past Medical History:  Diagnosis Date   Anxiety    Vitamin D  deficiency    Past Surgical History:  Procedure Laterality Date   CHOLECYSTECTOMY  2011   KIDNEY STONE SURGERY  1996   KNEE SURGERY Left    Patient Active Problem List   Diagnosis Date Noted   Prediabetes 10/10/2023   Pure hypercholesterolemia 09/14/2022   Class 1 obesity due to excess calories with body mass index (BMI) of 32.0 to 32.9 in adult 09/14/2022   Generalized anxiety disorder 07/13/2019   ONSET DATE: 01/07/24  REFERRING DIAG: W09.8XXD (ICD-10-CM) - Problem with rehabilitation of hand condition, subsequent encounter   THERAPY DIAG: Muscle weakness (generalized), other lack of coordination, crushing injury of L hand, initial encounter  Rationale for Evaluation and Treatment: Rehabilitation  SUBJECTIVE:  SUBJECTIVE STATEMENT: Pt reports that she felt good improvements in her swelling with the isotoner glove. Pt accompanied by: self  PERTINENT HISTORY: Per chart: ED  HPI 48 year old presents emergency department today with finger laceration to the left small finger and crush injury after she crushed her finger in the treadmill that she is moving.   PCP: Crush injury to fingers with sutures ER notes and imaging reviewed Crush injury with sutures in index finger. No fractures. Swelling and bruising present. No infection signs. Tetanus up to  date. Nerve pain likely from sutures and injury. - Remove sutures today. - Encourage gentle finger movements to prevent tendon fibrosis, avoiding extreme pain. - Apply Neosporin and cover with Band-Aid when using hands. - Allow wound to air dry for a few hours daily. - Avoid immersing hand in water until healed. - Advise against using a stress ball until more mobility is achieved.   Nerve issues in fingers post-injury Nerve issues in index finger and pinky post-injury. Recovery expected to take months to a year. Good blood flow, significant scarring anticipated. - Monitor nerve recovery over the coming months. - Avoid excessive strain on fingers to prevent further nerve damage.   PRECAUTIONS: Other: avoid excessive strain to fingers to prevent further nerve damage; monitor for s/s of infection as wounds heal  RED FLAGS: None   WEIGHT BEARING RESTRICTIONS: No  PAIN:  Are you having pain? Yes: NPRS scale: 1-2 at rest, up to 4-6/10 with activity  Pain location: L hand digits 2-5, IF and SF the most sore, soreness at the dorsal and volar MCPs Pain description: Sore, tingly, stiff, achy, sharp if stretching, tingling mostly in the dorsal digits and hand, minimal tingling on the volar side of the hand   Aggravating factors: activity  Relieving factors: rest and ibuprofen   FALLS: Has patient fallen in last 6 months? 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   NEXT MD VISIT: No follow up scheduled at this time; return as needed   OBJECTIVE:  Note: Objective measures were completed at Evaluation unless otherwise noted.  HAND DOMINANCE: Right  ADLs: Eating: difficulty cutting meat; difficulty stabilizing the fork in L hand with digits 1, 3, and 4 Grooming: using only L thumb, LF,  and RF to tie up hair in pony tail; not yet able to pinch a hair clip Upper body dressing: must front hook bra currently, baseline is back hooking but not yet able  Lower body dressing: unable to tie shoe laces tightly  Toileting: predominantly hiking/lowering pants with little engagement of L hand, using thumb only  Bathing: washing hair mostly 1 handed, if L hand is engaged only L thumb, LF, and RF are used  Tub shower transfers: indep/no difficulties  FUNCTIONAL OUTCOME MEASURES: Upper Extremity Functional Scale (UEFS): 56/80  UPPER EXTREMITY ROM:     Active ROM Right eval Left eval  Wrist flexion WNL WNL  Wrist extension WNL WNL  Wrist ulnar deviation WNL WNL  Wrist radial deviation WNL WNL  Wrist pronation WNL WNL  Wrist supination WNL WNL  (Blank rows = not tested)  L hand presents with full extension in each digit; measures below note flexion limitations:  Active ROM Right eval Left eval  Thumb MCP (0-60)    Thumb IP (0-80)    Thumb Radial abd/add (0-55)     Thumb Palmar abd/add (0-45)     Thumb Opposition to Small Finger     Index MCP (0-90)   70 (90)  Index PIP (0-100)   60 (5)  Index DIP (0-70)   20 (45)  Long MCP (0-90)      Long PIP (0-100)      Long DIP (0-70)      Ring MCP (0-90)      Ring PIP (0-100)      Ring DIP (0-70)      Little MCP (0-90)   30 (70)  Little PIP (0-100)    20 (40)  Little DIP (0-70)    10 (25)  (Blank rows = not tested)  L hand able to oppose all digits to thumb with soreness in the L SF L IF tip to palm: 7 cm; L SF unable to initiate sufficient flexion at the PIP to attempt tip to palm   UPPER EXTREMITY MMT: Proximal BUEs WNL; see below for hand weakness   HAND FUNCTION: Grip strength: Right: 65 lbs; Left: 13 lbs, Lateral pinch: Right: 13 lbs, Left: 10 lbs, and 3 point pinch: Right: 14 lbs, Left: 5 lbs (Grip strength measured engaging only the L thumb, 3rd, and 4th digits, and lateral pinch measured with extended  IF)  COORDINATION: 9 Hole Peg test: Right: 17 sec; Left: 26 sec  SENSATION: Tingling/numbness in the dorsal aspect of digits of L hand (IF and SF most severe), less tingling on volar aspect of all digits   EDEMA: Circumferential: L IF measured between PIP and MCP joints 7.2 cm, R 7.0 cm         L SF measured between PIP and MCP joints 6.4 cm , L 5.8 cm         Pt reports being newly able to wear her wedding ring on the L RF, but it feels tight  COGNITION: Overall cognitive status: Within functional limits for tasks assessed  OBSERVATIONS:  L IF suture site healing without s/s of infection, eschar present L LF with eschar present at the L SF DIP at the ulnar/volar aspect and without s/s of infection.   TREATMENT DATE: 02/22/24 Manual Therapy:  -soft tissue massage for edema mgmt and improved ROM  Therapeutic Exercise: -Reps of gentle passive/active assisted fisting digits 1-5 alternated by full digit ext  -Completed joint blocking exercises for increasing passive and active flexion at the MP, PIP, and DIPs of L hand digits 2 and 5 -Issued yellow theraputty and instructed pt in strengthening and coordination exercises for L hand, including gross grasping, lateral/2 point/3 point pinching, digit abd/add, and digging coins out of putty.  Able to return demo with intermittent vc for technique to improve quality of movement and grading exercises up/down.  Encouraged completion 5-10 min, 1-2x per day.    PATIENT EDUCATION: Education details: HEP progression Person educated: Patient Education method: Programmer, multimedia, Demonstration, and Verbal cues, visual handouts Education comprehension: verbalized understanding, returned demonstration, verbal cues required, tactile cues required, and needs further education  HOME EXERCISE PROGRAM: -L hand digit opposition, AROM/PROM of digits 1-5 within tolerable (minimal pain) range -don compression glove intermittently throughout the day -Digit blocking,  tendon glides, A/P/AAROM L hand digits  -yellow theraputty  GOALS: Goals reviewed with patient? Yes  SHORT TERM GOALS: Target date: 03/26/24  Pt will be indep to perform HEP for improving L hand flexibility, strength, and coordination. Baseline: Eval: HEP initiated this date; further training and progression in upcoming visits Goal status: INITIAL  2.  Pt will verbalize and implement 1-2 edema management strategies to reduce edema in L hand digits.  Baseline: Eval: Pt currently elevating hand and making attempts at AROM; further training needed Goal status: INITIAL  LONG TERM GOALS: Target date: 05/07/24  Pt will increase MAM-20 score for musculoskeletal conditions by 10 or more points to indicate improvement in self perceived functional use of the L hand for daily tasks.  Baseline: Eval: 56/80 Goal status: INITIAL  2.  Pt will increase L hand flexibility to enable closed fist of all digits for securely holding and transporting light ADL supplies without dropping. Baseline: Eval: Pt unable to make a fist d/t flexion limitations in L IF and SF (see above measures) Goal status: INITIAL  3.  Pt will tolerate manual therapy, therapeutic modalities, and exercises to decrease pain in L hand to a reported 2/10 pain or less with activity.  Baseline: Eval: 6/10 pain with activity (1-2/10 at rest) Goal status: INITIAL  4.  Pt will increase L grip strength by 10 or more lbs to securely pick up and sip from a cup with L hand. Baseline: Eval: L grip 13 lbs (R 65 lbs) Goal status: INITIAL  5.  Pt will increase L lateral pinch strength by 3 or more lbs to enable pt to stabilize a fork in L hand for cutting meat with greater ease. Baseline: Eval: L 10 lbs (R 13 lbs) Goal status: INITIAL  ASSESSMENT:  CLINICAL IMPRESSION: Pt reports that doing a contrast bath at home helped with her pain and swelling, and wearing the isotoner glove further helped to reduce edema in L hand.  Pt continues to  report improved active movement in the L IF, and noted improvement today in passive flexion in the SF at the PIP and DIP joints.  Pt with good tolerance to theraputty this date with good understanding of grading exercises up/down in order to better engage the L IF and SF.  Pt  will continue to benefit from skilled OT to address L hand edema, pain, stiffness, lack of coordination, and weakness in order to work towards improved functional use of the L hand with daily tasks.   PERFORMANCE DEFICITS: in functional skills including ADLs, IADLs, coordination, dexterity, sensation, edema, ROM, strength, pain, fascial restrictions, flexibility, Fine motor control, decreased knowledge of precautions, decreased knowledge of use of DME, wound, skin integrity, and UE functional use, and psychosocial skills including coping strategies and routines and behaviors.   IMPAIRMENTS: are limiting patient from ADLs, IADLs, rest and sleep, work, and leisure.   COMORBIDITIES: may have co-morbidities  that affects occupational performance. Patient will benefit from skilled OT to address above impairments and improve overall function.  MODIFICATION OR ASSISTANCE TO COMPLETE EVALUATION: No modification of tasks or assist necessary to complete an evaluation.  OT OCCUPATIONAL PROFILE AND HISTORY: Detailed assessment: Review of records and additional review of physical, cognitive, psychosocial history related to current functional performance.  CLINICAL DECISION MAKING: Moderate - several treatment options, min-mod task modification necessary  REHAB POTENTIAL: Good  EVALUATION COMPLEXITY: Moderate      PLAN:  OT FREQUENCY: 2x/week  OT DURATION: 12 weeks  PLANNED INTERVENTIONS: 97168 OT Re-evaluation, 97535 self care/ADL training, 21308 therapeutic exercise, 97530 therapeutic activity, 97112 neuromuscular re-education, 97140 manual therapy, 97018 paraffin, 65784 moist heat, 97010 cryotherapy, 97034 contrast bath, 97760  Orthotic Initial, 97763 Orthotic/Prosthetic subsequent, scar mobilization, passive range of motion, psychosocial skills training, coping strategies training, patient/family education, and DME and/or AE instructions  RECOMMENDED OTHER SERVICES: None at this time  CONSULTED AND AGREED WITH PLAN OF CARE: Patient  PLAN FOR NEXT SESSION: A/PROM  Casandra Claw, OT 02/23/2024, 4:37 PM

## 2024-02-29 ENCOUNTER — Ambulatory Visit

## 2024-02-29 DIAGNOSIS — R278 Other lack of coordination: Secondary | ICD-10-CM

## 2024-02-29 DIAGNOSIS — M6281 Muscle weakness (generalized): Secondary | ICD-10-CM

## 2024-02-29 DIAGNOSIS — S6722XA Crushing injury of left hand, initial encounter: Secondary | ICD-10-CM

## 2024-02-29 NOTE — Therapy (Unsigned)
 OUTPATIENT OCCUPATIONAL THERAPY ORTHO TREATMENT NOTE  Patient Name: Lori Huber MRN: [id number] DOB:11/25/1975, 48 y.o., female Today's Date: 02/29/2024  PCP: Helayne Lo, NP REFERRING PROVIDER: Helayne Lo, NP  END OF SESSION:   Past Medical History:  Diagnosis Date   Anxiety    Vitamin D  deficiency    Past Surgical History:  Procedure Laterality Date   CHOLECYSTECTOMY  2011   KIDNEY STONE SURGERY  1996   KNEE SURGERY Left    Patient Active Problem List   Diagnosis Date Noted   Prediabetes 10/10/2023   Pure hypercholesterolemia 09/14/2022   Class 1 obesity due to excess calories with body mass index (BMI) of 32.0 to 32.9 in adult 09/14/2022   Generalized anxiety disorder 07/13/2019   ONSET DATE: 01/07/24  REFERRING DIAG: W09.8XXD (ICD-10-CM) - Problem with rehabilitation of hand condition, subsequent encounter   THERAPY DIAG: Muscle weakness (generalized), other lack of coordination, crushing injury of L hand, initial encounter  Rationale for Evaluation and Treatment: Rehabilitation  SUBJECTIVE:  SUBJECTIVE STATEMENT: Pt reports that she felt good improvements in her swelling with the isotoner glove. Pt accompanied by: self  PERTINENT HISTORY: Per chart: ED  HPI 48 year old presents emergency department today with finger laceration to the left small finger and crush injury after she crushed her finger in the treadmill that she is moving.   PCP: Crush injury to fingers with sutures ER notes and imaging reviewed Crush injury with sutures in index finger. No fractures. Swelling and bruising present. No infection signs. Tetanus up to date. Nerve pain likely from sutures and injury. - Remove sutures today. - Encourage gentle finger movements to prevent tendon fibrosis, avoiding extreme pain. - Apply Neosporin and cover with Band-Aid when using hands. - Allow wound to air dry for a few hours daily. - Avoid immersing hand in water until healed. - Advise  against using a stress ball until more mobility is achieved.   Nerve issues in fingers post-injury Nerve issues in index finger and pinky post-injury. Recovery expected to take months to a year. Good blood flow, significant scarring anticipated. - Monitor nerve recovery over the coming months. - Avoid excessive strain on fingers to prevent further nerve damage.   PRECAUTIONS: Other: avoid excessive strain to fingers to prevent further nerve damage; monitor for s/s of infection as wounds heal  RED FLAGS: None   WEIGHT BEARING RESTRICTIONS: No  PAIN: 2/10 at rest, up to 6/10 pain with passive stretching at the DIP joint.  Are you having pain? Yes: NPRS scale: 1-2 at rest, up to 4-6/10 with activity  Pain location: L hand digits 2-5, IF and SF the most sore, soreness at the dorsal and volar MCPs Pain description: Sore, tingly, stiff, achy, sharp if stretching, tingling mostly in the dorsal digits and hand, minimal tingling on the volar side of the hand   Aggravating factors: activity  Relieving factors: rest and ibuprofen   FALLS: Has patient fallen in last 6 months? 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   NEXT MD VISIT: No follow up scheduled at this time; return as needed   OBJECTIVE:  Note: Objective measures were completed at Evaluation unless otherwise noted.  HAND DOMINANCE: Right  ADLs: Eating: difficulty cutting meat; difficulty stabilizing the fork in L hand with  digits 1, 3, and 4 Grooming: using only L thumb, LF, and RF to tie up hair in pony tail; not yet able to pinch a hair clip Upper body dressing: must front hook bra currently, baseline is back hooking but not yet able  Lower body dressing: unable to tie shoe laces tightly  Toileting: predominantly  hiking/lowering pants with little engagement of L hand, using thumb only  Bathing: washing hair mostly 1 handed, if L hand is engaged only L thumb, LF, and RF are used  Tub shower transfers: indep/no difficulties  FUNCTIONAL OUTCOME MEASURES: Upper Extremity Functional Scale (UEFS): 56/80  UPPER EXTREMITY ROM:     Active ROM Right eval Left eval  Wrist flexion WNL WNL  Wrist extension WNL WNL  Wrist ulnar deviation WNL WNL  Wrist radial deviation WNL WNL  Wrist pronation WNL WNL  Wrist supination WNL WNL  (Blank rows = not tested)  L hand presents with full extension in each digit; measures below note flexion limitations:  Active ROM Right eval Left eval  Thumb MCP (0-60)    Thumb IP (0-80)    Thumb Radial abd/add (0-55)     Thumb Palmar abd/add (0-45)     Thumb Opposition to Small Finger     Index MCP (0-90)   70 (90)  Index PIP (0-100)   60 (5)  Index DIP (0-70)   20 (45)  Long MCP (0-90)      Long PIP (0-100)      Long DIP (0-70)      Ring MCP (0-90)      Ring PIP (0-100)      Ring DIP (0-70)      Little MCP (0-90)   30 (70)  Little PIP (0-100)    20 (40)  Little DIP (0-70)    10 (25)  (Blank rows = not tested)  L hand able to oppose all digits to thumb with soreness in the L SF L IF tip to palm: 7 cm; L SF unable to initiate sufficient flexion at the PIP to attempt tip to palm  Passively L SF 2 cm tip to palm, actively 2.5 cm and opposition to SF without pain.   UPPER EXTREMITY MMT: Proximal BUEs WNL; see below for hand weakness   HAND FUNCTION: Grip strength: Right: 65 lbs; Left: 13 lbs, Lateral pinch: Right: 13 lbs, Left: 10 lbs, and 3 point pinch: Right: 14 lbs, Left: 5 lbs (Grip strength measured engaging only the L thumb, 3rd, and 4th digits, and lateral pinch measured with extended IF)  COORDINATION: 9 Hole Peg test: Right: 17 sec; Left: 26 sec  SENSATION: Tingling/numbness in the dorsal aspect of digits of L hand (IF and SF most severe), less  tingling on volar aspect of all digits   EDEMA: Circumferential: L IF measured between PIP and MCP joints 7.2 cm, R 7.0 cm         L SF measured between PIP and MCP joints 6.4 cm , L 5.8 cm         Pt reports being newly able to wear her wedding ring on the L RF, but it feels tight  COGNITION: Overall cognitive status: Within functional limits for tasks assessed  OBSERVATIONS:  L IF suture site healing without s/s of infection, eschar present L LF with eschar present at the L SF DIP at the ulnar/volar aspect and without s/s of infection.   TREATMENT DATE: 02/22/24 Manual Therapy:  -soft tissue massage for edema mgmt and  improved ROM  Therapeutic Exercise: -Reps of gentle passive/active assisted fisting digits 1-5 alternated by full digit ext  -Completed joint blocking exercises for increasing passive and active flexion at the MP, PIP, and DIPs of L hand digits 2 and 5 -Issued yellow theraputty and instructed pt in strengthening and coordination exercises for L hand, including gross grasping, lateral/2 point/3 point pinching, digit abd/add, and digging coins out of putty.  Able to return demo with intermittent vc for technique to improve quality of movement and grading exercises up/down.  Encouraged completion 5-10 min, 1-2x per day.    PATIENT EDUCATION: Education details: HEP progression Person educated: Patient Education method: Programmer, multimedia, Demonstration, and Verbal cues, visual handouts Education comprehension: verbalized understanding, returned demonstration, verbal cues required, tactile cues required, and needs further education  HOME EXERCISE PROGRAM: -L hand digit opposition, AROM/PROM of digits 1-5 within tolerable (minimal pain) range -don compression glove intermittently throughout the day -Digit blocking, tendon glides, A/P/AAROM L hand digits  -yellow theraputty  GOALS: Goals reviewed with patient? Yes  SHORT TERM GOALS: Target date: 03/26/24  Pt will be indep to  perform HEP for improving L hand flexibility, strength, and coordination. Baseline: Eval: HEP initiated this date; further training and progression in upcoming visits Goal status: INITIAL  2.  Pt will verbalize and implement 1-2 edema management strategies to reduce edema in L hand digits.  Baseline: Eval: Pt currently elevating hand and making attempts at AROM; further training needed Goal status: INITIAL  LONG TERM GOALS: Target date: 05/07/24  Pt will increase MAM-20 score for musculoskeletal conditions by 10 or more points to indicate improvement in self perceived functional use of the L hand for daily tasks.  Baseline: Eval: 56/80 Goal status: INITIAL  2.  Pt will increase L hand flexibility to enable closed fist of all digits for securely holding and transporting light ADL supplies without dropping. Baseline: Eval: Pt unable to make a fist d/t flexion limitations in L IF and SF (see above measures) Goal status: INITIAL  3.  Pt will tolerate manual therapy, therapeutic modalities, and exercises to decrease pain in L hand to a reported 2/10 pain or less with activity.  Baseline: Eval: 6/10 pain with activity (1-2/10 at rest) Goal status: INITIAL  4.  Pt will increase L grip strength by 10 or more lbs to securely pick up and sip from a cup with L hand. Baseline: Eval: L grip 13 lbs (R 65 lbs) Goal status: INITIAL  5.  Pt will increase L lateral pinch strength by 3 or more lbs to enable pt to stabilize a fork in L hand for cutting meat with greater ease. Baseline: Eval: L 10 lbs (R 13 lbs) Goal status: INITIAL  ASSESSMENT:  CLINICAL IMPRESSION: Pt reports that doing a contrast bath at home helped with her pain and swelling, and wearing the isotoner glove further helped to reduce edema in L hand.  Pt continues to report improved active movement in the L IF, and noted improvement today in passive flexion in the SF at the PIP and DIP joints.  Pt with good tolerance to theraputty this  date with good understanding of grading exercises up/down in order to better engage the L IF and SF.  Pt will continue to benefit from skilled OT to address L hand edema, pain, stiffness, lack of coordination, and weakness in order to work towards improved functional use of the L hand with daily tasks.   PERFORMANCE DEFICITS: in functional skills including ADLs, IADLs, coordination,  dexterity, sensation, edema, ROM, strength, pain, fascial restrictions, flexibility, Fine motor control, decreased knowledge of precautions, decreased knowledge of use of DME, wound, skin integrity, and UE functional use, and psychosocial skills including coping strategies and routines and behaviors.   IMPAIRMENTS: are limiting patient from ADLs, IADLs, rest and sleep, work, and leisure.   COMORBIDITIES: may have co-morbidities  that affects occupational performance. Patient will benefit from skilled OT to address above impairments and improve overall function.  MODIFICATION OR ASSISTANCE TO COMPLETE EVALUATION: No modification of tasks or assist necessary to complete an evaluation.  OT OCCUPATIONAL PROFILE AND HISTORY: Detailed assessment: Review of records and additional review of physical, cognitive, psychosocial history related to current functional performance.  CLINICAL DECISION MAKING: Moderate - several treatment options, min-mod task modification necessary  REHAB POTENTIAL: Good  EVALUATION COMPLEXITY: Moderate      PLAN:  OT FREQUENCY: 2x/week  OT DURATION: 12 weeks  PLANNED INTERVENTIONS: 97168 OT Re-evaluation, 97535 self care/ADL training, 16109 therapeutic exercise, 97530 therapeutic activity, 97112 neuromuscular re-education, 97140 manual therapy, 97018 paraffin, 60454 moist heat, 97010 cryotherapy, 97034 contrast bath, 97760 Orthotic Initial, 97763 Orthotic/Prosthetic subsequent, scar mobilization, passive range of motion, psychosocial skills training, coping strategies training, patient/family  education, and DME and/or AE instructions  RECOMMENDED OTHER SERVICES: None at this time  CONSULTED AND AGREED WITH PLAN OF CARE: Patient  PLAN FOR NEXT SESSION: A/PROM  Casandra Claw, OT 02/29/2024, 9:33 AM

## 2024-03-02 ENCOUNTER — Ambulatory Visit

## 2024-03-02 DIAGNOSIS — M6281 Muscle weakness (generalized): Secondary | ICD-10-CM

## 2024-03-02 DIAGNOSIS — S6722XA Crushing injury of left hand, initial encounter: Secondary | ICD-10-CM

## 2024-03-02 DIAGNOSIS — R278 Other lack of coordination: Secondary | ICD-10-CM

## 2024-03-02 NOTE — Therapy (Unsigned)
 OUTPATIENT OCCUPATIONAL THERAPY ORTHO TREATMENT NOTE  Patient Name: Lori Huber MRN: [id number] DOB:01-17-1976, 48 y.o., female Today's Date: 03/04/2024  PCP: Angeline Laura, NP REFERRING PROVIDER: Angeline Laura, NP  END OF SESSION:  OT End of Session - 03/04/24 2016     Visit Number 6    Number of Visits 24    Date for OT Re-Evaluation 05/07/24    OT Start Time 0800    OT Stop Time 0845    OT Time Calculation (min) 45 min    Activity Tolerance Patient tolerated treatment well    Behavior During Therapy [facility] for tasks assessed/performed          Past Medical History:  Diagnosis Date   Anxiety    Vitamin D  deficiency    Past Surgical History:  Procedure Laterality Date   CHOLECYSTECTOMY  2011   KIDNEY STONE SURGERY  1996   KNEE SURGERY Left    Patient Active Problem List   Diagnosis Date Noted   Prediabetes 10/10/2023   Pure hypercholesterolemia 09/14/2022   Class 1 obesity due to excess calories with body mass index (BMI) of 32.0 to 32.9 in adult 09/14/2022   Generalized anxiety disorder 07/13/2019   ONSET DATE: 01/07/24  REFERRING DIAG: U11.8XXD (ICD-10-CM) - Problem with rehabilitation of hand condition, subsequent encounter   THERAPY DIAG: Muscle weakness (generalized), other lack of coordination, crushing injury of L hand, initial encounter  Rationale for Evaluation and Treatment: Rehabilitation  SUBJECTIVE:  SUBJECTIVE STATEMENT: Pt reports wearing her glove during her walks in the morning has been really effective to manage her swelling.   Pt accompanied by: self  PERTINENT HISTORY: Per chart: ED  HPI 48 year old presents emergency department today with finger laceration to the left small finger and crush injury after she crushed her finger in the treadmill that she is moving.   PCP: Crush injury to fingers with sutures ER notes and imaging reviewed Crush injury with sutures in index finger. No fractures. Swelling and bruising present. No  infection signs. Tetanus up to date. Nerve pain likely from sutures and injury. - Remove sutures today. - Encourage gentle finger movements to prevent tendon fibrosis, avoiding extreme pain. - Apply Neosporin and cover with Band-Aid when using hands. - Allow wound to air dry for a few hours daily. - Avoid immersing hand in water until healed. - Advise against using a stress ball until more mobility is achieved.   Nerve issues in fingers post-injury Nerve issues in index finger and pinky post-injury. Recovery expected to take months to a year. Good blood flow, significant scarring anticipated. - Monitor nerve recovery over the coming months. - Avoid excessive strain on fingers to prevent further nerve damage.   PRECAUTIONS: Other: avoid excessive strain to fingers to prevent further nerve damage; monitor for s/s of infection as wounds heal  RED FLAGS: None   WEIGHT BEARING RESTRICTIONS: No  PAIN: 03/02/24: 0/10 at rest, ranging from 3-6/10 with activity  Are you having pain? Yes: NPRS scale: 1-2 at rest, up to 4-6/10 with activity  Pain location: L hand digits 2-5, IF and SF the most sore, soreness at the dorsal and volar MCPs Pain description: Sore, tingly, stiff, achy, sharp if stretching, tingling mostly in the dorsal digits and hand, minimal tingling on the volar side of the hand   Aggravating factors: activity  Relieving factors: rest and ibuprofen   FALLS: Has patient fallen in last 6 months? 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   NEXT MD VISIT: No follow up scheduled at this time; return as needed   OBJECTIVE:  Note: Objective measures were completed at Evaluation unless otherwise noted.  HAND DOMINANCE: Right  ADLs: Eating: difficulty cutting meat; difficulty  stabilizing the fork in L hand with digits 1, 3, and 4 Grooming: using only L thumb, LF, and RF to tie up hair in pony tail; not yet able to pinch a hair clip Upper body dressing: must front hook bra currently, baseline is back hooking but not yet able  Lower body dressing: unable to tie shoe laces tightly  Toileting: predominantly hiking/lowering pants with little engagement of L hand, using thumb only  Bathing: washing hair mostly 1 handed, if L hand is engaged only L thumb, LF, and RF are used  Tub shower transfers: indep/no difficulties  FUNCTIONAL OUTCOME MEASURES: Upper Extremity Functional Scale (UEFS): 56/80  UPPER EXTREMITY ROM:     Active ROM Right eval Left eval  Wrist flexion WNL WNL  Wrist extension WNL WNL  Wrist ulnar deviation WNL WNL  Wrist radial deviation WNL WNL  Wrist pronation WNL WNL  Wrist supination WNL WNL  (Blank rows = not tested)  L hand presents with full extension in each digit; measures below note flexion limitations:  Active ROM Right eval Left eval  Thumb MCP (0-60)    Thumb IP (0-80)    Thumb Radial abd/add (0-55)     Thumb Palmar abd/add (0-45)     Thumb Opposition to Small Finger     Index MCP (0-90)   70 (90)  Index PIP (0-100)   60 (5)  Index DIP (0-70)   20 (45)  Long MCP (0-90)      Long PIP (0-100)      Long DIP (0-70)      Ring MCP (0-90)      Ring PIP (0-100)      Ring DIP (0-70)      Little MCP (0-90)   30 (70)  Little PIP (0-100)    20 (40)  Little DIP (0-70)    10 (25)  (Blank rows = not tested)  L hand able to oppose all digits to thumb with soreness in the L SF L IF tip to palm: 7 cm; L SF unable to initiate sufficient flexion at the PIP to attempt tip to palm  Passively L SF 2 cm tip to palm, actively 2.5 cm and opposition to SF without pain.   UPPER EXTREMITY MMT: Proximal BUEs WNL; see below for hand weakness   HAND FUNCTION: Grip strength: Right: 65 lbs; Left: 13 lbs, Lateral pinch: Right: 13 lbs, Left: 10  lbs, and 3 point pinch: Right: 14 lbs, Left: 5 lbs (Grip strength measured engaging only the L thumb, 3rd, and 4th digits, and lateral pinch measured with extended IF)  COORDINATION: 9 Hole Peg test: Right: 17 sec; Left: 26 sec  SENSATION: Tingling/numbness in the dorsal aspect of digits of L hand (IF and SF most severe), less tingling on volar aspect of all digits   EDEMA: Circumferential: L IF measured between PIP and MCP joints 7.2 cm, R 7.0 cm         L SF measured between PIP and MCP joints 6.4 cm , L 5.8 cm  Pt reports being newly able to wear her wedding ring on the L RF, but it feels tight  COGNITION: Overall cognitive status: Within functional limits for tasks assessed  OBSERVATIONS:  L IF suture site healing without s/s of infection, eschar present L LF with eschar present at the L SF DIP at the ulnar/volar aspect and without s/s of infection.   TREATMENT DATE: 03/02/24 Manual Therapy:  -Scar massage completed for flattening, desensitizing, and mobilizing scar tissue to reduce adhesions at L IF and SF scars.    Therapeutic Exercise: -Reps of gentle passive/active assisted fisting digits 1-5 alternated by full digit ext  -Completed joint blocking exercises for increasing passive and active flexion at the MP, PIP, and DIPs of L hand digits 2 and 5; good return demo with pt using pen to block each joint  -Issued small palm sized stress ball and completed gentle reps of grip squeezes with min vc for engaging L SF into each gripping rep.   PATIENT EDUCATION: Education details: HEP progression Person educated: Patient Education method: Programmer, multimedia, Demonstration, and Verbal cues, visual handouts Education comprehension: verbalized understanding, returned demonstration, verbal cues required, tactile cues required, and needs further education  HOME EXERCISE PROGRAM: -L hand digit opposition, AROM/PROM of digits 1-5 within tolerable (minimal pain) range -don compression  glove intermittently throughout the day -Digit blocking, tendon glides, A/P/AAROM L hand digits  -yellow theraputty  GOALS: Goals reviewed with patient? Yes  SHORT TERM GOALS: Target date: 03/26/24  Pt will be indep to perform HEP for improving L hand flexibility, strength, and coordination. Baseline: Eval: HEP initiated this date; further training and progression in upcoming visits Goal status: INITIAL  2.  Pt will verbalize and implement 1-2 edema management strategies to reduce edema in L hand digits.  Baseline: Eval: Pt currently elevating hand and making attempts at AROM; further training needed Goal status: INITIAL  LONG TERM GOALS: Target date: 05/07/24  Pt will increase MAM-20 score for musculoskeletal conditions by 10 or more points to indicate improvement in self perceived functional use of the L hand for daily tasks.  Baseline: Eval: 56/80 Goal status: INITIAL  2.  Pt will increase L hand flexibility to enable closed fist of all digits for securely holding and transporting light ADL supplies without dropping. Baseline: Eval: Pt unable to make a fist d/t flexion limitations in L IF and SF (see above measures) Goal status: INITIAL  3.  Pt will tolerate manual therapy, therapeutic modalities, and exercises to decrease pain in L hand to a reported 2/10 pain or less with activity.  Baseline: Eval: 6/10 pain with activity (1-2/10 at rest) Goal status: INITIAL  4.  Pt will increase L grip strength by 10 or more lbs to securely pick up and sip from a cup with L hand. Baseline: Eval: L grip 13 lbs (R 65 lbs) Goal status: INITIAL  5.  Pt will increase L lateral pinch strength by 3 or more lbs to enable pt to stabilize a fork in L hand for cutting meat with greater ease. Baseline: Eval: L 10 lbs (R 13 lbs) Goal status: INITIAL  ASSESSMENT:  CLINICAL IMPRESSION: Pt reports wearing compression glove during her walk has made a significant difference in her swelling.  Scars appear  more flattened and moist after 2 days with [facility] care and pt completing scar massage at home as instructed last session.  L hand IF flexibility continues to improve in all joints; L SF MCP passive flexion WNL, PIP and DIP passive  and active flexion improving, but still BFL.  Pt will continue to benefit from skilled OT to address L hand edema, pain, stiffness, lack of coordination, and weakness in order to work towards improved functional use of the L hand with daily tasks.   PERFORMANCE DEFICITS: in functional skills including ADLs, IADLs, coordination, dexterity, sensation, edema, ROM, strength, pain, fascial restrictions, flexibility, Fine motor control, decreased knowledge of precautions, decreased knowledge of use of DME, wound, skin integrity, and UE functional use, and psychosocial skills including coping strategies and routines and behaviors.   IMPAIRMENTS: are limiting patient from ADLs, IADLs, rest and sleep, work, and leisure.   COMORBIDITIES: may have co-morbidities  that affects occupational performance. Patient will benefit from skilled OT to address above impairments and improve overall function.  MODIFICATION OR ASSISTANCE TO COMPLETE EVALUATION: No modification of tasks or assist necessary to complete an evaluation.  OT OCCUPATIONAL PROFILE AND HISTORY: Detailed assessment: Review of records and additional review of physical, cognitive, psychosocial history related to current functional performance.  CLINICAL DECISION MAKING: Moderate - several treatment options, min-mod task modification necessary  REHAB POTENTIAL: Good  EVALUATION COMPLEXITY: Moderate      PLAN:  OT FREQUENCY: 2x/week  OT DURATION: 12 weeks  PLANNED INTERVENTIONS: 97168 OT Re-evaluation, 97535 self care/ADL training, 02889 therapeutic exercise, 97530 therapeutic activity, 97112 neuromuscular re-education, 97140 manual therapy, 97018 paraffin, 02989 moist heat, 97010 cryotherapy, 97034 contrast bath, 97760  Orthotic Initial, 97763 Orthotic/Prosthetic subsequent, scar mobilization, passive range of motion, psychosocial skills training, coping strategies training, patient/family education, and DME and/or AE instructions  RECOMMENDED OTHER SERVICES: None at this time  CONSULTED AND AGREED WITH PLAN OF CARE: Patient  PLAN FOR NEXT SESSION: A/PROM  Inocente Blazing, MS, OTR/L  Inocente MARLA Blazing, OT 03/04/2024, 8:19 PM

## 2024-03-07 ENCOUNTER — Ambulatory Visit

## 2024-03-07 DIAGNOSIS — R278 Other lack of coordination: Secondary | ICD-10-CM

## 2024-03-07 DIAGNOSIS — M6281 Muscle weakness (generalized): Secondary | ICD-10-CM

## 2024-03-07 DIAGNOSIS — S6722XA Crushing injury of left hand, initial encounter: Secondary | ICD-10-CM

## 2024-03-07 NOTE — Therapy (Signed)
 OUTPATIENT OCCUPATIONAL THERAPY ORTHO TREATMENT NOTE  Patient Name: Lori Huber MRN: [id number] DOB:[birth year], 49 y.o., female Today's Date: 03/09/2024  PCP: Angeline Laura, NP REFERRING PROVIDER: Angeline Laura, NP  END OF SESSION:  OT End of Session - 03/09/24 2111     Visit Number 7    Number of Visits 24    Date for OT Re-Evaluation 05/07/24    OT Start Time 1100    OT Stop Time 1145    OT Time Calculation (min) 45 min    Activity Tolerance Patient tolerated treatment well    Behavior During Therapy [facility] for tasks assessed/performed         Past Medical History:  Diagnosis Date   Anxiety    Vitamin D  deficiency    Past Surgical History:  Procedure Laterality Date   CHOLECYSTECTOMY  2011   KIDNEY STONE SURGERY  1996   KNEE SURGERY Left    Patient Active Problem List   Diagnosis Date Noted   Prediabetes 10/10/2023   Pure hypercholesterolemia 09/14/2022   Class 1 obesity due to excess calories with body mass index (BMI) of 32.0 to 32.9 in adult 09/14/2022   Generalized anxiety disorder 07/13/2019   ONSET DATE: 01/07/24  REFERRING DIAG: U11.8XXD (ICD-10-CM) - Problem with rehabilitation of hand condition, subsequent encounter   THERAPY DIAG: Muscle weakness (generalized), other lack of coordination, crushing injury of L hand, initial encounter  Rationale for Evaluation and Treatment: Rehabilitation  SUBJECTIVE:  SUBJECTIVE STATEMENT: Pt reports she continues to focus on less guarding of her L IF and SF when using her L hand during the day. Pt accompanied by: self  PERTINENT HISTORY: Per chart: ED  HPI 48 year old presents emergency department today with finger laceration to the left small finger and crush injury after she crushed her finger in the treadmill that she is moving.   PCP: Crush injury to fingers with sutures ER notes and imaging reviewed Crush injury with sutures in index finger. No fractures. Swelling and bruising present. No infection  signs. Tetanus up to date. Nerve pain likely from sutures and injury. - Remove sutures today. - Encourage gentle finger movements to prevent tendon fibrosis, avoiding extreme pain. - Apply Neosporin and cover with Band-Aid when using hands. - Allow wound to air dry for a few hours daily. - Avoid immersing hand in water until healed. - Advise against using a stress ball until more mobility is achieved.   Nerve issues in fingers post-injury Nerve issues in index finger and pinky post-injury. Recovery expected to take months to a year. Good blood flow, significant scarring anticipated. - Monitor nerve recovery over the coming months. - Avoid excessive strain on fingers to prevent further nerve damage.   PRECAUTIONS: Other: avoid excessive strain to fingers to prevent further nerve damage; monitor for s/s of infection as wounds heal  RED FLAGS: None   WEIGHT BEARING RESTRICTIONS: No  PAIN: 03/07/24: 2/10 at rest, ranging from 3-6/10 with activity  Are you having pain? Yes: NPRS scale: 1-2 at rest, up to 4-6/10 with activity  Pain location: L hand digits 2-5, IF and SF the most sore, soreness at the dorsal and volar MCPs Pain description: Sore, tingly, stiff, achy, sharp if stretching, tingling mostly in the dorsal digits and hand, minimal tingling on the volar side of the hand   Aggravating factors: activity  Relieving factors: rest and ibuprofen   FALLS: Has patient fallen in last 6 months? 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   NEXT MD VISIT: No follow up scheduled at this time; return as needed   OBJECTIVE:  Note: Objective measures were completed at Evaluation unless otherwise noted.  HAND DOMINANCE: Right  ADLs: Eating: difficulty cutting meat; difficulty stabilizing  the fork in L hand with digits 1, 3, and 4 Grooming: using only L thumb, LF, and RF to tie up hair in pony tail; not yet able to pinch a hair clip Upper body dressing: must front hook bra currently, baseline is back hooking but not yet able  Lower body dressing: unable to tie shoe laces tightly  Toileting: predominantly hiking/lowering pants with little engagement of L hand, using thumb only  Bathing: washing hair mostly 1 handed, if L hand is engaged only L thumb, LF, and RF are used  Tub shower transfers: indep/no difficulties  FUNCTIONAL OUTCOME MEASURES: Upper Extremity Functional Scale (UEFS): 56/80  UPPER EXTREMITY ROM:     Active ROM Right eval Left eval  Wrist flexion WNL WNL  Wrist extension WNL WNL  Wrist ulnar deviation WNL WNL  Wrist radial deviation WNL WNL  Wrist pronation WNL WNL  Wrist supination WNL WNL  (Blank rows = not tested)  L hand presents with full extension in each digit; measures below note flexion limitations:  Active ROM Right eval Left eval  Thumb MCP (0-60)    Thumb IP (0-80)    Thumb Radial abd/add (0-55)     Thumb Palmar abd/add (0-45)     Thumb Opposition to Small Finger     Index MCP (0-90)   70 (90)  Index PIP (0-100)   60 (5)  Index DIP (0-70)   20 (45)  Long MCP (0-90)      Long PIP (0-100)      Long DIP (0-70)      Ring MCP (0-90)      Ring PIP (0-100)      Ring DIP (0-70)      Little MCP (0-90)   30 (70)  Little PIP (0-100)    20 (40)  Little DIP (0-70)    10 (25)  (Blank rows = not tested)  L hand able to oppose all digits to thumb with soreness in the L SF L IF tip to palm: 7 cm; L SF unable to initiate sufficient flexion at the PIP to attempt tip to palm  Passively L SF 2 cm tip to palm, actively 2.5 cm and opposition to SF without pain.   UPPER EXTREMITY MMT: Proximal BUEs WNL; see below for hand weakness   HAND FUNCTION: Grip strength: Right: 65 lbs; Left: 13 lbs, Lateral pinch: Right: 13 lbs, Left: 10 lbs, and 3  point pinch: Right: 14 lbs, Left: 5 lbs (Grip strength measured engaging only the L thumb, 3rd, and 4th digits, and lateral pinch measured with extended IF)  COORDINATION: 9 Hole Peg test: Right: 17 sec; Left: 26 sec  SENSATION: Tingling/numbness in the dorsal aspect of digits of L hand (IF and SF most severe), less tingling on volar aspect of all digits   EDEMA: Circumferential: L IF measured between PIP and MCP joints 7.2 cm, R 7.0 cm         L SF measured between PIP and MCP joints 6.4 cm , L 5.8 cm  Pt reports being newly able to wear her wedding ring on the L RF, but it feels tight  COGNITION: Overall cognitive status: Within functional limits for tasks assessed  OBSERVATIONS:  L IF suture site healing without s/s of infection, eschar present L LF with eschar present at the L SF DIP at the ulnar/volar aspect and without s/s of infection.   TREATMENT DATE: 03/07/24 Manual Therapy:  -Scar massage completed for flattening, desensitizing, and mobilizing scar tissue to reduce adhesions at L IF and SF scars.    Therapeutic Exercise: -Reps of gentle passive/active assisted fisting digits 1-5 alternated by full digit ext  -Completed joint blocking exercises for increasing passive and active flexion at the MP, PIP, and DIPs of L hand digits 2 and 5; good return demo with pt using R hand for blocking each joint.  Therapeutic Activity: -Task practice engaging only digits 1, 4, and 5 of L hand: pt worked to pull and dig out small pegs from putty  PATIENT EDUCATION: Education details: HEP progression/HEP review, review of desensitization strategies for L hand digits  Person educated: Patient Education method: Programmer, multimedia, Demonstration, and Verbal cues Education comprehension: verbalized understanding, returned demonstration, and verbal cues required  HOME EXERCISE PROGRAM: -L hand digit opposition, AROM/PROM of digits 1-5 within tolerable (minimal pain) range -don compression  glove intermittently throughout the day -Digit blocking, tendon glides, A/P/AAROM L hand digits  -yellow theraputty  GOALS: Goals reviewed with patient? Yes  SHORT TERM GOALS: Target date: 03/26/24  Pt will be indep to perform HEP for improving L hand flexibility, strength, and coordination. Baseline: Eval: HEP initiated this date; further training and progression in upcoming visits Goal status: INITIAL  2.  Pt will verbalize and implement 1-2 edema management strategies to reduce edema in L hand digits.  Baseline: Eval: Pt currently elevating hand and making attempts at AROM; further training needed Goal status: INITIAL  LONG TERM GOALS: Target date: 05/07/24  Pt will increase MAM-20 score for musculoskeletal conditions by 10 or more points to indicate improvement in self perceived functional use of the L hand for daily tasks.  Baseline: Eval: 56/80 Goal status: INITIAL  2.  Pt will increase L hand flexibility to enable closed fist of all digits for securely holding and transporting light ADL supplies without dropping. Baseline: Eval: Pt unable to make a fist d/t flexion limitations in L IF and SF (see above measures) Goal status: INITIAL  3.  Pt will tolerate manual therapy, therapeutic modalities, and exercises to decrease pain in L hand to a reported 2/10 pain or less with activity.  Baseline: Eval: 6/10 pain with activity (1-2/10 at rest) Goal status: INITIAL  4.  Pt will increase L grip strength by 10 or more lbs to securely pick up and sip from a cup with L hand. Baseline: Eval: L grip 13 lbs (R 65 lbs) Goal status: INITIAL  5.  Pt will increase L lateral pinch strength by 3 or more lbs to enable pt to stabilize a fork in L hand for cutting meat with greater ease. Baseline: Eval: L 10 lbs (R 13 lbs) Goal status: INITIAL  ASSESSMENT:  CLINICAL IMPRESSION: Pt continues to show improvements with passive and active fisting of L hand digits.  L SF DIP and PIP remain the most  stiff and tender, but improving steadily.  Pt still presents with guarding of L IF and SF when holding or carrying light ADL supplies, but reports she is trying to focus on engaging these digits  more.  Pt tolerated pegs/putty activity fairly well, and was encouraged to perform a similar activity at home using pennies and putty for engaging these digits into activity.  Pt will continue to benefit from skilled OT to address L hand edema, pain, stiffness, lack of coordination, and weakness in order to work towards improved functional use of the L hand with daily tasks.   PERFORMANCE DEFICITS: in functional skills including ADLs, IADLs, coordination, dexterity, sensation, edema, ROM, strength, pain, fascial restrictions, flexibility, Fine motor control, decreased knowledge of precautions, decreased knowledge of use of DME, wound, skin integrity, and UE functional use, and psychosocial skills including coping strategies and routines and behaviors.   IMPAIRMENTS: are limiting patient from ADLs, IADLs, rest and sleep, work, and leisure.   COMORBIDITIES: may have co-morbidities  that affects occupational performance. Patient will benefit from skilled OT to address above impairments and improve overall function.  MODIFICATION OR ASSISTANCE TO COMPLETE EVALUATION: No modification of tasks or assist necessary to complete an evaluation.  OT OCCUPATIONAL PROFILE AND HISTORY: Detailed assessment: Review of records and additional review of physical, cognitive, psychosocial history related to current functional performance.  CLINICAL DECISION MAKING: Moderate - several treatment options, min-mod task modification necessary  REHAB POTENTIAL: Good  EVALUATION COMPLEXITY: Moderate    PLAN:  OT FREQUENCY: 2x/week  OT DURATION: 12 weeks  PLANNED INTERVENTIONS: 97168 OT Re-evaluation, 97535 self care/ADL training, 02889 therapeutic exercise, 97530 therapeutic activity, 97112 neuromuscular re-education, 97140  manual therapy, 97018 paraffin, 02989 moist heat, 97010 cryotherapy, 97034 contrast bath, 97760 Orthotic Initial, 97763 Orthotic/Prosthetic subsequent, scar mobilization, passive range of motion, psychosocial skills training, coping strategies training, patient/family education, and DME and/or AE instructions  RECOMMENDED OTHER SERVICES: None at this time  CONSULTED AND AGREED WITH PLAN OF CARE: Patient  PLAN FOR NEXT SESSION: A/PROM  Inocente Blazing, MS, OTR/L  Inocente MARLA Blazing, OT 03/09/2024, 9:12 PM

## 2024-03-09 ENCOUNTER — Ambulatory Visit

## 2024-03-13 ENCOUNTER — Encounter

## 2024-03-13 NOTE — Progress Notes (Signed)
 She has not okay with  Office Visit Note  Patient: Lori Huber             Date of Birth: 06-21-1976           MRN: [id number]             PCP: Antonette Angeline ORN, NP Referring: Antonette Angeline ORN, NP Visit Date: 03/27/2024 Occupation: @GUAROCC @  Subjective:  Pain in multiple joints  History of Present Illness: Lori Huber is a 48 y.o. female seen for the evaluation of polyarthralgia and positive ANA.  According the patient her symptoms started about 1-1/2 years ago with increased morning stiffness and joint discomfort.  She attributed her symptoms to being postmenopausal.  She went to see her PCP in January 2025 at the time she mentioned her symptoms of polyarthralgia and labs were obtained.  Her ANA was positive and for that reason she was referred to me.  She complains of discomfort in her neck, upper back, lower back, shoulders, elbows, wrist and her hands.  She also has some discomfort in her hips, ankles and her feet.  She has not noticed any joint swelling.  She denies any history of myalgias.  She gives history of morning stiffness lasting for about 20 minutes.  She complains of nocturnal pain mostly in her torso.  She states about 2-1/2 months ago she had a crush injury to her left hand while she was moving a treadmill.  She had a stitches to her little finger and a cut on her index finger.  She also has swelling in her other fingers.  She is using a compression glove and going to physical therapy which has been helpful.  She takes ibuprofen and Tylenol over-the-counter which helps to some extent.  She denies any history of rash.  There is no family history of psoriasis.  There is no known family history of autoimmune disease.  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  She enjoys walking and hiking.  She is married, right-handed, gravida 1, abortion 1.  There is no history of DVTs.  She drinks alcohol occasionally and is  non-smoker.    Activities of Daily Living:  Patient reports morning stiffness for 20 minutes.   Patient Reports nocturnal pain.  Difficulty dressing/grooming: Denies Difficulty climbing stairs: Reports Difficulty getting out of chair: Reports Difficulty using hands for taps, buttons, cutlery, and/or writing: Denies  Review of Systems  Constitutional:  Positive for fatigue.  HENT:  Negative for mouth sores and mouth dryness.   Eyes:  Negative for dryness.  Respiratory:  Negative for difficulty breathing.   Cardiovascular:  Negative for chest pain and palpitations.  Gastrointestinal:  Negative for blood in stool, constipation and diarrhea.  Endocrine: Negative for increased urination.  Genitourinary:  Negative for involuntary urination.  Musculoskeletal:  Positive for joint pain, joint pain, myalgias, morning stiffness and myalgias. Negative for gait problem, joint swelling, muscle weakness and muscle tenderness.  Skin:  Negative for color change, rash, hair loss and sensitivity to sunlight.  Allergic/Immunologic: Negative for susceptible to infections.  Neurological:  Positive for headaches. Negative for dizziness.  Hematological:  Negative for swollen glands.  Psychiatric/Behavioral:  Positive for sleep disturbance. Negative for depressed mood. The patient is nervous/anxious.     PMFS History:  Patient Active Problem List   Diagnosis Date Noted   Prediabetes 10/10/2023   Pure hypercholesterolemia 09/14/2022   Class 1 obesity due to excess calories with body  mass index (BMI) of 32.0 to 32.9 in adult 09/14/2022   Generalized anxiety disorder 07/13/2019    Past Medical History:  Diagnosis Date   Anxiety    Vitamin D  deficiency     Family History  Problem Relation Age of Onset   Hypertension Father    Hypertension Brother    Hypertension Paternal Grandmother    Stroke Paternal Grandmother    Heart disease Paternal Grandmother    Diabetes Paternal Grandmother    Breast  cancer Neg Hx    Past Surgical History:  Procedure Laterality Date   CHOLECYSTECTOMY  2011   KIDNEY STONE SURGERY  1996   KNEE SURGERY Left    Social History   Social History Narrative   Not on file   Immunization History  Administered Date(s) Administered   Influenza Inj Mdck Quad Pf 07/06/2019, 07/06/2021   Influenza, Mdck, Trivalent,PF 6+ MOS(egg free) 07/12/2023   Influenza, Quadrivalent, Recombinant, Inj, Pf 07/17/2018   Influenza-Unspecified 06/18/2020   Td 09/13/1994   Tdap 07/06/2019     Objective: Vital Signs: BP 132/85 (BP Location: Right Arm, Patient Position: Sitting, Cuff Size: Large)   Pulse 87   Resp 13   Ht 5' 4.75 (1.645 m)   Wt 239 lb 6.4 oz (108.6 kg)   BMI 40.15 kg/m    Physical Exam Vitals and nursing note reviewed.  Constitutional:      Appearance: She is well-developed.  HENT:     Head: Normocephalic and atraumatic.  Eyes:     Conjunctiva/sclera: Conjunctivae normal.  Cardiovascular:     Rate and Rhythm: Normal rate and regular rhythm.     Heart sounds: Normal heart sounds.  Pulmonary:     Effort: Pulmonary effort is normal.     Breath sounds: Normal breath sounds.  Abdominal:     General: Bowel sounds are normal.     Palpations: Abdomen is soft.  Musculoskeletal:     Cervical back: Normal range of motion.  Lymphadenopathy:     Cervical: No cervical adenopathy.  Skin:    General: Skin is warm and dry.     Capillary Refill: Capillary refill takes less than 2 seconds.  Neurological:     Mental Status: She is alert and oriented to person, place, and time.  Psychiatric:        Behavior: Behavior normal.      Musculoskeletal Exam: Cervical, thoracic and lumbar spine with good range of motion.  She discomfort range of motion of her cervical and lumbar spine.  Shoulders, elbows, wrist joints, MCPs PIPs and DIPs with good range of motion with no synovitis.  She tenderness across her MCPs and PIP joints.  No synovitis was noted.  Hip  joints were in good range of motion.  Knee joints were in good range of motion without any warmth swelling or effusion.  There was no tenderness over her ankles or MTPs.  Hammertoes were noted.  No synovitis was noted.  She had multiple tender points and hyperalgesia.  CDAI Exam: CDAI Score: -- Patient Global: --; Provider Global: -- Swollen: --; Tender: -- Joint Exam 03/27/2024   No joint exam has been documented for this visit   There is currently no information documented on the homunculus. Go to the Rheumatology activity and complete the homunculus joint exam.  Investigation: No additional findings.  Imaging: XR Foot 2 Views Left Result Date: 03/27/2024 First MTP, PIP and DIP narrowing was noted.  No intertarsal, tibiotalar or subtalar joint space narrowing was noted.  No erosive changes were noted. 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  XR Foot 2 Views Right Result Date: 03/27/2024 First MTP, PIP and DIP narrowing was noted.  No intertarsal, tibiotalar or subtalar joint space narrowing was noted.  No erosive changes were noted. 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  XR Lumbar Spine 2-3 Views Result Date: 03/27/2024 No significant disc space narrowing was noted.  Anterior spurring was noted.  Facet joint arthropathy was noted.  No SI joint sclerosis or narrowing was noted. Impression: These findings are suggestive of facet joint arthropathy and lumbar spondylosis.  XR Cervical Spine 2 or 3 views Result Date: 03/27/2024 Multilevel spondylosis was noted.  C3 on C4 spondylolisthesis was noted.  Anterior spurring was noted.  Facet joint arthropathy was noted.  Most significant narrowing was noted between C5-C6. Impression: These findings suggestive of multilevel spondylosis and facet joint arthropathy.  XR Hand 2 View Left Result Date: 03/27/2024 Mild PIP and DIP narrowing was noted.  No CMC, MCP,  intercarpal or radiocarpal joint space narrowing was noted.  No erosive changes were noted. Impression: These findings with history of of early osteoarthritis of the hand.  XR Hand 2 View Right Result Date: 03/27/2024 Mild PIP and DIP narrowing was noted.  No CMC, MCP, intercarpal or radiocarpal joint space narrowing was noted.  No erosive changes were noted. Impression: These findings with history of of early osteoarthritis of the hand.   Recent Labs: Lab Results  Component Value Date   WBC 6.6 10/10/2023   HGB 14.7 10/10/2023   PLT 253 10/10/2023   NA 142 10/10/2023   K 4.4 10/10/2023   CL 104 10/10/2023   CO2 29 10/10/2023   GLUCOSE 84 10/10/2023   BUN 18 10/10/2023   CREATININE 0.80 10/10/2023   BILITOT 1.7 (H) 10/10/2023   ALKPHOS 55 01/07/2018   AST 13 10/10/2023   ALT 9 10/10/2023   PROT 7.5 10/10/2023   ALBUMIN 3.8 01/07/2018   CALCIUM 10.3 (H) 10/10/2023   GFRAA >60 01/07/2018   October 10, 2023 ANA 1: 40 nuclear, nucleolar, sed rate 6, CRP<3.0, vitamin D  66, LDL 116, hemoglobin A1c 5.5  Speciality Comments: No specialty comments available.  Procedures:  No procedures performed Allergies: Zoloft  [sertraline ], Egg-derived products, Ciprofloxacin , and Flagyl  [metronidazole ]   Assessment / Plan:     Visit Diagnoses: Positive ANA (antinuclear antibody) -patient has low titer positive ANA.  She gives history of fatigue, arthralgias.  There is no history of oral ulcers, nasal ulcers, malar rash, photosensitivity, Raynaud's or lymphadenopathy.  She denies history of inflammatory arthritis.  No synovitis was noted on the examination.  To complete the workup I will obtain additional antibodies today.  Plan: Sedimentation rate, ANA, Anti-scleroderma antibody, RNP Antibody, Anti-Smith antibody, Sjogrens syndrome-A extractable nuclear antibody, Sjogrens syndrome-B extractable nuclear antibody, Anti-DNA antibody, double-stranded, C3 and C4  Pain in both hands -she complains of  discomfort in her bilateral hands.  She had crush injury to her left hand  with no fracture on January 07, 2024.  Patient states she required stitches to her little finger.  She has been going to physical therapy.  She continues to have some stiffness and discomfort in the bilateral hands and morning stiffness.  I will obtain x-rays today.- Plan: XR Hand 2 View Right, XR Hand 2 View Left, early osteoarthritis was noted on the x-rays.  Cyclic citrul peptide antibody, IgG, Rheumatoid factor, Uric acid  Pain in both  feet -she complains of discomfort in her bilateral feet.  Hammertoes were noted.  No synovitis was noted.  Plan: XR Foot 2 Views Right, XR Foot 2 Views Left.  Early degenerative changes were noted on the x-rays of bilateral feet.  Neck pain -she has ongoing pain and discomfort in her cervical spine with morning stiffness and nocturnal pain.  Plan: XR Cervical Spine 2 or 3 views.  C3 and C4 spondylolisthesis was noted.  C5-C6 significant narrowing was noted.  Facet joint arthropathy was noted.  Chronic midline low back pain without sciatica -she complains of discomfort in her lower back.  She denies any radiculopathy.  Plan: XR Lumbar Spine 2-3 Views.  Early degenerative changes and facet joint arthropathy was noted.  Other fatigue -she has been experiencing fatigue.  She complains of insomnia.  Plan: Serum protein electrophoresis with reflex, Comprehensive metabolic panel with GFR, CK, TSH  Myofascial pain-she continues to have generalized pain and discomfort.  She gives history of arthralgias and some myalgias.  She had positive tender points and hyperalgesia on the examination.  She states she has not been sleeping well.  Good sleep hygiene and regular exercise was emphasized.  Other medical problems are listed as follows:  Prediabetes  Pure hypercholesterolemia  Vitamin D  deficiency -she gives history of vitamin D  deficiency.  She has been taking vitamin D .  Plan: VITAMIN D  25 Hydroxy  (Vit-D Deficiency, Fractures)  Generalized anxiety disorder  BMI 40.0-44.9, adult (HCC)-need for regular exercise was emphasized.  Orders: Orders Placed This Encounter  Procedures   XR Hand 2 View Right   XR Hand 2 View Left   XR Cervical Spine 2 or 3 views   XR Lumbar Spine 2-3 Views   XR Foot 2 Views Right   XR Foot 2 Views Left   Sedimentation rate   ANA   Anti-scleroderma antibody   RNP Antibody   Anti-Smith antibody   Sjogrens syndrome-A extractable nuclear antibody   Sjogrens syndrome-B extractable nuclear antibody   Anti-DNA antibody, double-stranded   C3 and C4   Cyclic citrul peptide antibody, IgG   Rheumatoid factor   Uric acid   Serum protein electrophoresis with reflex   Comprehensive metabolic panel with GFR   VITAMIN D  25 Hydroxy (Vit-D Deficiency, Fractures)   CK   TSH   No orders of the defined types were placed in this encounter.    Follow-Up Instructions: Return for Polyarthralgia.   Maya Nash, MD  Note - This record has been created using Animal nutritionist.  Chart creation errors have been sought, but may not always  have been located. Such creation errors do not reflect on  the standard of medical care.

## 2024-03-15 ENCOUNTER — Encounter

## 2024-03-21 ENCOUNTER — Ambulatory Visit: Attending: Internal Medicine

## 2024-03-21 DIAGNOSIS — R278 Other lack of coordination: Secondary | ICD-10-CM | POA: Diagnosis present

## 2024-03-21 DIAGNOSIS — M6281 Muscle weakness (generalized): Secondary | ICD-10-CM | POA: Diagnosis present

## 2024-03-21 DIAGNOSIS — S6722XA Crushing injury of left hand, initial encounter: Secondary | ICD-10-CM | POA: Insufficient documentation

## 2024-03-21 NOTE — Therapy (Unsigned)
 OUTPATIENT OCCUPATIONAL THERAPY ORTHO TREATMENT NOTE  Patient Name: Lori Huber MRN: [id number] DOB:1975/09/20, 48 y.o., female Today's Date: 03/21/2024  PCP: Angeline Laura, NP REFERRING PROVIDER: Angeline Laura, NP  END OF SESSION:  OT End of Session - 03/21/24 1022     Visit Number 8    Number of Visits 24    Date for OT Re-Evaluation 05/07/24    OT Start Time 1018    OT Stop Time 1100    OT Time Calculation (min) 42 min    Activity Tolerance Patient tolerated treatment well    Behavior During Therapy [facility] for tasks assessed/performed         Past Medical History:  Diagnosis Date   Anxiety    Vitamin D  deficiency    Past Surgical History:  Procedure Laterality Date   CHOLECYSTECTOMY  2011   KIDNEY STONE SURGERY  1996   KNEE SURGERY Left    Patient Active Problem List   Diagnosis Date Noted   Prediabetes 10/10/2023   Pure hypercholesterolemia 09/14/2022   Class 1 obesity due to excess calories with body mass index (BMI) of 32.0 to 32.9 in adult 09/14/2022   Generalized anxiety disorder 07/13/2019   ONSET DATE: 01/07/24  REFERRING DIAG: U11.8XXD (ICD-10-CM) - Problem with rehabilitation of hand condition, subsequent encounter   THERAPY DIAG: Muscle weakness (generalized), other lack of coordination, crushing injury of L hand, initial encounter  Rationale for Evaluation and Treatment: Rehabilitation  SUBJECTIVE:  SUBJECTIVE STATEMENT: Pt reports she continues to focus on less guarding of her L IF and SF when using her L hand during the day. Pt accompanied by: self  PERTINENT HISTORY: Per chart: ED  HPI 48 year old presents emergency department today with finger laceration to the left small finger and crush injury after she crushed her finger in the treadmill that she is moving.   PCP: Crush injury to fingers with sutures ER notes and imaging reviewed Crush injury with sutures in index finger. No fractures. Swelling and bruising present. No infection  signs. Tetanus up to date. Nerve pain likely from sutures and injury. - Remove sutures today. - Encourage gentle finger movements to prevent tendon fibrosis, avoiding extreme pain. - Apply Neosporin and cover with Band-Aid when using hands. - Allow wound to air dry for a few hours daily. - Avoid immersing hand in water until healed. - Advise against using a stress ball until more mobility is achieved.   Nerve issues in fingers post-injury Nerve issues in index finger and pinky post-injury. Recovery expected to take months to a year. Good blood flow, significant scarring anticipated. - Monitor nerve recovery over the coming months. - Avoid excessive strain on fingers to prevent further nerve damage.   PRECAUTIONS: Other: avoid excessive strain to fingers to prevent further nerve damage; monitor for s/s of infection as wounds heal  RED FLAGS: None   WEIGHT BEARING RESTRICTIONS: No  PAIN: 03/07/24: 2/10 at rest, ranging from 3-6/10 with activity  Are you having pain? Yes: NPRS scale: 1-2 at rest, up to 4-6/10 with activity  Pain location: L hand digits 2-5, IF and SF the most sore, soreness at the dorsal and volar MCPs Pain description: Sore, tingly, stiff, achy, sharp if stretching, tingling mostly in the dorsal digits and hand, minimal tingling on the volar side of the hand   Aggravating factors: activity  Relieving factors: rest and ibuprofen   FALLS: Has patient fallen in last 6 months? 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   NEXT MD VISIT: No follow up scheduled at this time; return as needed   OBJECTIVE:  Note: Objective measures were completed at Evaluation unless otherwise noted.  HAND DOMINANCE: Right  ADLs: Eating: difficulty cutting meat; difficulty stabilizing  the fork in L hand with digits 1, 3, and 4 Grooming: using only L thumb, LF, and RF to tie up hair in pony tail; not yet able to pinch a hair clip Upper body dressing: must front hook bra currently, baseline is back hooking but not yet able  Lower body dressing: unable to tie shoe laces tightly  Toileting: predominantly hiking/lowering pants with little engagement of L hand, using thumb only  Bathing: washing hair mostly 1 handed, if L hand is engaged only L thumb, LF, and RF are used  Tub shower transfers: indep/no difficulties  FUNCTIONAL OUTCOME MEASURES: Upper Extremity Functional Scale (UEFS): 56/80  UPPER EXTREMITY ROM:     Active ROM Right eval Left eval  Wrist flexion WNL WNL  Wrist extension WNL WNL  Wrist ulnar deviation WNL WNL  Wrist radial deviation WNL WNL  Wrist pronation WNL WNL  Wrist supination WNL WNL  (Blank rows = not tested)  L hand presents with full extension in each digit; measures below note flexion limitations:  Active ROM Right eval Left eval  Thumb MCP (0-60)    Thumb IP (0-80)    Thumb Radial abd/add (0-55)     Thumb Palmar abd/add (0-45)     Thumb Opposition to Small Finger     Index MCP (0-90)   70 (90)  Index PIP (0-100)   60 (5)  Index DIP (0-70)   20 (45)  Long MCP (0-90)      Long PIP (0-100)      Long DIP (0-70)      Ring MCP (0-90)      Ring PIP (0-100)      Ring DIP (0-70)      Little MCP (0-90)   30 (70)  Little PIP (0-100)    20 (40)  Little DIP (0-70)    10 (25)  (Blank rows = not tested)  L hand able to oppose all digits to thumb with soreness in the L SF L IF tip to palm: 7 cm; L SF unable to initiate sufficient flexion at the PIP to attempt tip to palm  Passively L SF 2 cm tip to palm, actively 2.5 cm and opposition to SF without pain.   UPPER EXTREMITY MMT: Proximal BUEs WNL; see below for hand weakness   HAND FUNCTION: Grip strength: Right: 65 lbs; Left: 13 lbs, Lateral pinch: Right: 13 lbs, Left: 10 lbs, and 3  point pinch: Right: 14 lbs, Left: 5 lbs (Grip strength measured engaging only the L thumb, 3rd, and 4th digits, and lateral pinch measured with extended IF)  COORDINATION: 9 Hole Peg test: Right: 17 sec; Left: 26 sec  SENSATION: Tingling/numbness in the dorsal aspect of digits of L hand (IF and SF most severe), less tingling on volar aspect of all digits   EDEMA: Circumferential: L IF measured between PIP and MCP joints 7.2 cm, R 7.0 cm         L SF measured between PIP and MCP joints 6.4 cm , L 5.8 cm  Pt reports being newly able to wear her wedding ring on the L RF, but it feels tight  COGNITION: Overall cognitive status: Within functional limits for tasks assessed  OBSERVATIONS:  L IF suture site healing without s/s of infection, eschar present L LF with eschar present at the L SF DIP at the ulnar/volar aspect and without s/s of infection.   TREATMENT DATE: 03/07/24 Manual Therapy:  -Scar massage completed for flattening, desensitizing, and mobilizing scar tissue to reduce adhesions at L IF and SF scars.    Therapeutic Exercise: -Reps of gentle passive/active assisted fisting digits 1-5 alternated by full digit ext  -Completed joint blocking exercises for increasing passive and active flexion at the MP, PIP, and DIPs of L hand digits 2 and 5; good return demo with pt using R hand for blocking each joint.  Therapeutic Activity: -Task practice engaging only digits 1, 4, and 5 of L hand: pt worked to pull and dig out small pegs from putty  PATIENT EDUCATION: Education details: HEP progression/HEP review, review of desensitization strategies for L hand digits  Person educated: Patient Education method: Programmer, multimedia, Demonstration, and Verbal cues Education comprehension: verbalized understanding, returned demonstration, and verbal cues required  HOME EXERCISE PROGRAM: -L hand digit opposition, AROM/PROM of digits 1-5 within tolerable (minimal pain) range -don compression  glove intermittently throughout the day -Digit blocking, tendon glides, A/P/AAROM L hand digits  -yellow theraputty  GOALS: Goals reviewed with patient? Yes  SHORT TERM GOALS: Target date: 03/26/24  Pt will be indep to perform HEP for improving L hand flexibility, strength, and coordination. Baseline: Eval: HEP initiated this date; further training and progression in upcoming visits Goal status: INITIAL  2.  Pt will verbalize and implement 1-2 edema management strategies to reduce edema in L hand digits.  Baseline: Eval: Pt currently elevating hand and making attempts at AROM; further training needed Goal status: INITIAL  LONG TERM GOALS: Target date: 05/07/24  Pt will increase MAM-20 score for musculoskeletal conditions by 10 or more points to indicate improvement in self perceived functional use of the L hand for daily tasks.  Baseline: Eval: 56/80 Goal status: INITIAL  2.  Pt will increase L hand flexibility to enable closed fist of all digits for securely holding and transporting light ADL supplies without dropping. Baseline: Eval: Pt unable to make a fist d/t flexion limitations in L IF and SF (see above measures) Goal status: INITIAL  3.  Pt will tolerate manual therapy, therapeutic modalities, and exercises to decrease pain in L hand to a reported 2/10 pain or less with activity.  Baseline: Eval: 6/10 pain with activity (1-2/10 at rest) Goal status: INITIAL  4.  Pt will increase L grip strength by 10 or more lbs to securely pick up and sip from a cup with L hand. Baseline: Eval: L grip 13 lbs (R 65 lbs) Goal status: INITIAL  5.  Pt will increase L lateral pinch strength by 3 or more lbs to enable pt to stabilize a fork in L hand for cutting meat with greater ease. Baseline: Eval: L 10 lbs (R 13 lbs) Goal status: INITIAL  ASSESSMENT:  CLINICAL IMPRESSION: Pt continues to show improvements with passive and active fisting of L hand digits.  L SF DIP and PIP remain the most  stiff and tender, but improving steadily.  Pt still presents with guarding of L IF and SF when holding or carrying light ADL supplies, but reports she is trying to focus on engaging these digits  more.  Pt tolerated pegs/putty activity fairly well, and was encouraged to perform a similar activity at home using pennies and putty for engaging these digits into activity.  Pt will continue to benefit from skilled OT to address L hand edema, pain, stiffness, lack of coordination, and weakness in order to work towards improved functional use of the L hand with daily tasks.   PERFORMANCE DEFICITS: in functional skills including ADLs, IADLs, coordination, dexterity, sensation, edema, ROM, strength, pain, fascial restrictions, flexibility, Fine motor control, decreased knowledge of precautions, decreased knowledge of use of DME, wound, skin integrity, and UE functional use, and psychosocial skills including coping strategies and routines and behaviors.   IMPAIRMENTS: are limiting patient from ADLs, IADLs, rest and sleep, work, and leisure.   COMORBIDITIES: may have co-morbidities  that affects occupational performance. Patient will benefit from skilled OT to address above impairments and improve overall function.  MODIFICATION OR ASSISTANCE TO COMPLETE EVALUATION: No modification of tasks or assist necessary to complete an evaluation.  OT OCCUPATIONAL PROFILE AND HISTORY: Detailed assessment: Review of records and additional review of physical, cognitive, psychosocial history related to current functional performance.  CLINICAL DECISION MAKING: Moderate - several treatment options, min-mod task modification necessary  REHAB POTENTIAL: Good  EVALUATION COMPLEXITY: Moderate    PLAN:  OT FREQUENCY: 2x/week  OT DURATION: 12 weeks  PLANNED INTERVENTIONS: 97168 OT Re-evaluation, 97535 self care/ADL training, 02889 therapeutic exercise, 97530 therapeutic activity, 97112 neuromuscular re-education, 97140  manual therapy, 97018 paraffin, 02989 moist heat, 97010 cryotherapy, 97034 contrast bath, 97760 Orthotic Initial, 97763 Orthotic/Prosthetic subsequent, scar mobilization, passive range of motion, psychosocial skills training, coping strategies training, patient/family education, and DME and/or AE instructions  RECOMMENDED OTHER SERVICES: None at this time  CONSULTED AND AGREED WITH PLAN OF CARE: Patient  PLAN FOR NEXT SESSION: A/PROM  Inocente Blazing, MS, OTR/L  Inocente MARLA Blazing, OT 03/21/2024, 10:26 AM

## 2024-03-23 ENCOUNTER — Ambulatory Visit

## 2024-03-26 ENCOUNTER — Ambulatory Visit

## 2024-03-27 ENCOUNTER — Ambulatory Visit (INDEPENDENT_AMBULATORY_CARE_PROVIDER_SITE_OTHER)

## 2024-03-27 ENCOUNTER — Ambulatory Visit

## 2024-03-27 ENCOUNTER — Ambulatory Visit: Payer: Managed Care, Other (non HMO) | Attending: Rheumatology | Admitting: Rheumatology

## 2024-03-27 ENCOUNTER — Encounter: Payer: Self-pay | Admitting: Rheumatology

## 2024-03-27 VITALS — BP 132/85 | HR 87 | Resp 13 | Ht 64.75 in | Wt 239.4 lb

## 2024-03-27 DIAGNOSIS — M542 Cervicalgia: Secondary | ICD-10-CM | POA: Diagnosis not present

## 2024-03-27 DIAGNOSIS — M79642 Pain in left hand: Secondary | ICD-10-CM | POA: Diagnosis not present

## 2024-03-27 DIAGNOSIS — E559 Vitamin D deficiency, unspecified: Secondary | ICD-10-CM

## 2024-03-27 DIAGNOSIS — M79671 Pain in right foot: Secondary | ICD-10-CM

## 2024-03-27 DIAGNOSIS — R5383 Other fatigue: Secondary | ICD-10-CM

## 2024-03-27 DIAGNOSIS — M545 Low back pain, unspecified: Secondary | ICD-10-CM | POA: Diagnosis not present

## 2024-03-27 DIAGNOSIS — Z6841 Body Mass Index (BMI) 40.0 and over, adult: Secondary | ICD-10-CM

## 2024-03-27 DIAGNOSIS — M79641 Pain in right hand: Secondary | ICD-10-CM

## 2024-03-27 DIAGNOSIS — M79672 Pain in left foot: Secondary | ICD-10-CM

## 2024-03-27 DIAGNOSIS — R768 Other specified abnormal immunological findings in serum: Secondary | ICD-10-CM

## 2024-03-27 DIAGNOSIS — E7800 Pure hypercholesterolemia, unspecified: Secondary | ICD-10-CM

## 2024-03-27 DIAGNOSIS — R7303 Prediabetes: Secondary | ICD-10-CM

## 2024-03-27 DIAGNOSIS — E6609 Other obesity due to excess calories: Secondary | ICD-10-CM

## 2024-03-27 DIAGNOSIS — F411 Generalized anxiety disorder: Secondary | ICD-10-CM

## 2024-03-27 DIAGNOSIS — G8929 Other chronic pain: Secondary | ICD-10-CM | POA: Diagnosis not present

## 2024-03-27 DIAGNOSIS — M7918 Myalgia, other site: Secondary | ICD-10-CM

## 2024-03-27 NOTE — Patient Instructions (Addendum)
Cervical Strain and Sprain Rehab Ask your health care provider which exercises are safe for you. Do exercises exactly as told by your health care provider and adjust them as directed. It is normal to feel mild stretching, pulling, tightness, or discomfort as you do these exercises. Stop right away if you feel sudden pain or your pain gets worse. Do not begin these exercises until told by your health care provider. Stretching and range-of-motion exercises Cervical side bending  Using good posture, sit on a stable chair or stand up. Without moving your shoulders, slowly tilt your left / right ear to your shoulder until you feel a stretch in the neck muscles on the opposite side. You should be looking straight ahead. Hold for __________ seconds. Repeat with the other side of your neck. Repeat __________ times. Complete this exercise __________ times a day. Cervical rotation  Using good posture, sit on a stable chair or stand up. Slowly turn your head to the side as if you are looking over your left / right shoulder. Keep your eyes level with the ground. Stop when you feel a stretch along the side and the back of your neck. Hold for __________ seconds. Repeat this by turning to your other side. Repeat __________ times. Complete this exercise __________ times a day. Thoracic extension and pectoral stretch  Roll a towel or a small blanket so it is about 4 inches (10 cm) in diameter. Lie down on your back on a firm surface. Put the towel in the middle of your back across your spine. It should not be under your shoulder blades. Put your hands behind your head and let your elbows fall out to your sides. Hold for __________ seconds. Repeat __________ times. Complete this exercise __________ times a day. Strengthening exercises Upper cervical flexion  Lie on your back with a thin pillow behind your head or a small, rolled-up towel under your neck. Gently tuck your chin toward your chest and nod  your head down to look toward your feet. Do not lift your head off the pillow. Hold for __________ seconds. Release the tension slowly. Relax your neck muscles completely before you repeat this exercise. Repeat __________ times. Complete this exercise __________ times a day. Cervical extension  Stand about 6 inches (15 cm) away from a wall, with your back facing the wall. Place a soft object, about 6-8 inches (15-20 cm) in diameter, between the back of your head and the wall. A soft object could be a small pillow, a ball, or a folded towel. Gently tilt your head back and press into the soft object. Keep your jaw and forehead relaxed. Hold for __________ seconds. Release the tension slowly. Relax your neck muscles completely before you repeat this exercise. Repeat __________ times. Complete this exercise __________ times a day. Posture and body mechanics Body mechanics refer to the movements and positions of your body while you do your daily activities. Posture is part of body mechanics. Good posture and healthy body mechanics can help to relieve stress in your body's tissues and joints. Good posture means that your spine is in its natural S-curve position (your spine is neutral), your shoulders are pulled back slightly, and your head is not tipped forward. The following are general guidelines for using improved posture and body mechanics in your everyday activities. Sitting  When sitting, keep your spine neutral and keep your feet flat on the floor. Use a footrest, if needed, and keep your thighs parallel to the floor. Avoid rounding  your shoulders. Avoid tilting your head forward. When working at a desk or a computer, keep your desk at a height where your hands are slightly lower than your elbows. Slide your chair under your desk so you are close enough to maintain good posture. When working at a computer, place your monitor at a height where you are looking straight ahead and you do not have to  tilt your head forward or downward to look at the screen. Standing  When standing, keep your spine neutral and keep your feet about hip-width apart. Keep a slight bend in your knees. Your ears, shoulders, and hips should line up. When you do a task in which you stand in one place for a long time, place one foot up on a stable object that is 2-4 inches (5-10 cm) high, such as a footstool. This helps keep your spine neutral. Resting When lying down and resting, avoid positions that are most painful for you. Try to support your neck in a neutral position. You can use a contour pillow or a small rolled-up towel. Your pillow should support your neck but not push on it. This information is not intended to replace advice given to you by your health care provider. Make sure you discuss any questions you have with your health care provider. Document Revised: 01/03/2023 Document Reviewed: 03/22/2022 [employer] Patient Education  2024 [employer] Low Back Sprain or Strain Rehab Ask your health care provider which exercises are safe for you. Do exercises exactly as told by your health care provider and adjust them as directed. It is normal to feel mild stretching, pulling, tightness, or discomfort as you do these exercises. Stop right away if you feel sudden pain or your pain gets worse. Do not begin these exercises until told by your health care provider. Stretching and range-of-motion exercises These exercises warm up your muscles and joints and improve the movement and flexibility of your back. These exercises also help to relieve pain, numbness, and tingling. Lumbar rotation  Lie on your back on a firm bed or the floor with your knees bent. Straighten your arms out to your sides so each arm forms a 90-degree angle (right angle) with a side of your body. Slowly move (rotate) both of your knees to one side of your body until you feel a stretch in your lower back (lumbar). Try not to let your shoulders lift  off the floor. Hold this position for __________ seconds. Tense your abdominal muscles and slowly move your knees back to the starting position. Repeat this exercise on the other side of your body. Repeat __________ times. Complete this exercise __________ times a day. Single knee to chest  Lie on your back on a firm bed or the floor with both legs straight. Bend one of your knees. Use your hands to move your knee up toward your chest until you feel a gentle stretch in your lower back and buttock. Hold your leg in this position by holding on to the front of your knee. Keep your other leg as straight as possible. Hold this position for __________ seconds. Slowly return to the starting position. Repeat with your other leg. Repeat __________ times. Complete this exercise __________ times a day. Prone extension on elbows  Lie on your abdomen on a firm bed or the floor (prone position). Prop yourself up on your elbows. Use your arms to help lift your chest up until you feel a gentle stretch in your abdomen and your lower back.  This will place some of your body weight on your elbows. If this is uncomfortable, try stacking pillows under your chest. Your hips should stay down, against the surface that you are lying on. Keep your hip and back muscles relaxed. Hold this position for __________ seconds. Slowly relax your upper body and return to the starting position. Repeat __________ times. Complete this exercise __________ times a day. Strengthening exercises These exercises build strength and endurance in your back. Endurance is the ability to use your muscles for a long time, even after they get tired. Pelvic tilt This exercise strengthens the muscles that lie deep in the abdomen. Lie on your back on a firm bed or the floor with your legs extended. Bend your knees so they are pointing toward the ceiling and your feet are flat on the floor. Tighten your lower abdominal muscles to press your  lower back against the floor. This motion will tilt your pelvis so your tailbone points up toward the ceiling instead of pointing to your feet or the floor. To help with this exercise, you may place a small towel under your lower back and try to push your back into the towel. Hold this position for __________ seconds. Let your muscles relax completely before you repeat this exercise. Repeat __________ times. Complete this exercise __________ times a day. Alternating arm and leg raises  Get on your hands and knees on a firm surface. If you are on a hard floor, you may want to use padding, such as an exercise mat, to cushion your knees. Line up your arms and legs. Your hands should be directly below your shoulders, and your knees should be directly below your hips. Lift your left leg behind you. At the same time, raise your right arm and straighten it in front of you. Do not lift your leg higher than your hip. Do not lift your arm higher than your shoulder. Keep your abdominal and back muscles tight. Keep your hips facing the ground. Do not arch your back. Keep your balance carefully, and do not hold your breath. Hold this position for __________ seconds. Slowly return to the starting position. Repeat with your right leg and your left arm. Repeat __________ times. Complete this exercise __________ times a day. Abdominal set with straight leg raise  Lie on your back on a firm bed or the floor. Bend one of your knees and keep your other leg straight. Tense your abdominal muscles and lift your straight leg up, 4-6 inches (10-15 cm) off the ground. Keep your abdominal muscles tight and hold this position for __________ seconds. Do not hold your breath. Do not arch your back. Keep it flat against the ground. Keep your abdominal muscles tense as you slowly lower your leg back to the starting position. Repeat with your other leg. Repeat __________ times. Complete this exercise __________ times a  day. Single leg lower with bent knees Lie on your back on a firm bed or the floor. Tense your abdominal muscles and lift your feet off the floor, one foot at a time, so your knees and hips are bent in 90-degree angles (right angles). Your knees should be over your hips and your lower legs should be parallel to the floor. Keeping your abdominal muscles tense and your knee bent, slowly lower one of your legs so your toe touches the ground. Lift your leg back up to return to the starting position. Do not hold your breath. Do not let your back arch. Keep  your back flat against the ground. Repeat with your other leg. Repeat __________ times. Complete this exercise __________ times a day. Posture and body mechanics Good posture and healthy body mechanics can help to relieve stress in your body's tissues and joints. Body mechanics refers to the movements and positions of your body while you do your daily activities. Posture is part of body mechanics. Good posture means: Your spine is in its natural S-curve position (neutral). Your shoulders are pulled back slightly. Your head is not tipped forward (neutral). Follow these guidelines to improve your posture and body mechanics in your everyday activities. Standing  When standing, keep your spine neutral and your feet about hip-width apart. Keep a slight bend in your knees. Your ears, shoulders, and hips should line up. When you do a task in which you stand in one place for a long time, place one foot up on a stable object that is 2-4 inches (5-10 cm) high, such as a footstool. This helps keep your spine neutral. Sitting  When sitting, keep your spine neutral and keep your feet flat on the floor. Use a footrest, if necessary, and keep your thighs parallel to the floor. Avoid rounding your shoulders, and avoid tilting your head forward. When working at a desk or a computer, keep your desk at a height where your hands are slightly lower than your elbows.  Slide your chair under your desk so you are close enough to maintain good posture. When working at a computer, place your monitor at a height where you are looking straight ahead and you do not have to tilt your head forward or downward to look at the screen. Resting When lying down and resting, avoid positions that are most painful for you. If you have pain with activities such as sitting, bending, stooping, or squatting, lie in a position in which your body does not bend very much. For example, avoid curling up on your side with your arms and knees near your chest (fetal position). If you have pain with activities such as standing for a long time or reaching with your arms, lie with your spine in a neutral position and bend your knees slightly. Try the following positions: Lying on your side with a pillow between your knees. Lying on your back with a pillow under your knees. Lifting  When lifting objects, keep your feet at least shoulder-width apart and tighten your abdominal muscles. Bend your knees and hips and keep your spine neutral. It is important to lift using the strength of your legs, not your back. Do not lock your knees straight out. Always ask for help to lift heavy or awkward objects. This information is not intended to replace advice given to you by your health care provider. Make sure you discuss any questions you have with your health care provider. Document Revised: 01/03/2023 Document Reviewed: 11/17/2020 [employer] Patient Education  2024 [employer] Hand Exercises Hand exercises can be helpful for almost anyone. They can strengthen your hands and improve flexibility and movement. The exercises can also increase blood flow to the hands. These results can make your work and daily tasks easier for you. Hand exercises can be especially helpful for people who have joint pain from arthritis or nerve damage from using their hands over and over. These exercises can also help people  who injure a hand. Exercises Most of these hand exercises are gentle stretching and motion exercises. It is usually safe to do them often throughout the day. Warming  up your hands before exercise may help reduce stiffness. You can do this with gentle massage or by placing your hands in warm water for 10-15 minutes. It is normal to feel some stretching, pulling, tightness, or mild discomfort when you begin new exercises. In time, this will improve. Remember to always be careful and stop right away if you feel sudden, very bad pain or your pain gets worse. You want to get better and be safe. Ask your health care provider which exercises are safe for you. Do exercises exactly as told by your provider and adjust them as told. Do not begin these exercises until told by your provider. Knuckle bend or "claw" fist  Stand or sit with your arm, hand, and all five fingers pointed straight up. Make sure to keep your wrist straight. Gently bend your fingers down toward your palm until the tips of your fingers are touching your palm. Keep your big knuckle straight and only bend the small knuckles in your fingers. Hold this position for 10 seconds. Straighten your fingers back to your starting position. Repeat this exercise 5-10 times with each hand. Full finger fist  Stand or sit with your arm, hand, and all five fingers pointed straight up. Make sure to keep your wrist straight. Gently bend your fingers into your palm until the tips of your fingers are touching the middle of your palm. Hold this position for 10 seconds. Extend your fingers back to your starting position, stretching every joint fully. Repeat this exercise 5-10 times with each hand. Straight fist  Stand or sit with your arm, hand, and all five fingers pointed straight up. Make sure to keep your wrist straight. Gently bend your fingers at the big knuckle, where your fingers meet your hand, and at the middle knuckle. Keep the knuckle at the  tips of your fingers straight and try to touch the bottom of your palm. Hold this position for 10 seconds. Extend your fingers back to your starting position, stretching every joint fully. Repeat this exercise 5-10 times with each hand. Tabletop  Stand or sit with your arm, hand, and all five fingers pointed straight up. Make sure to keep your wrist straight. Gently bend your fingers at the big knuckle, where your fingers meet your hand, as far down as you can. Keep the small knuckles in your fingers straight. Think of forming a tabletop with your fingers. Hold this position for 10 seconds. Extend your fingers back to your starting position, stretching every joint fully. Repeat this exercise 5-10 times with each hand. Finger spread  Place your hand flat on a table with your palm facing down. Make sure your wrist stays straight. Spread your fingers and thumb apart from each other as far as you can until you feel a gentle stretch. Hold this position for 10 seconds. Bring your fingers and thumb tight together again. Hold this position for 10 seconds. Repeat this exercise 5-10 times with each hand. Making circles  Stand or sit with your arm, hand, and all five fingers pointed straight up. Make sure to keep your wrist straight. Make a circle by touching the tip of your thumb to the tip of your index finger. Hold for 10 seconds. Then open your hand wide. Repeat this motion with your thumb and each of your fingers. Repeat this exercise 5-10 times with each hand. Thumb motion  Sit with your forearm resting on a table and your wrist straight. Your thumb should be facing up toward the ceiling.  Keep your fingers relaxed as you move your thumb. Lift your thumb up as high as you can toward the ceiling. Hold for 10 seconds. Bend your thumb across your palm as far as you can, reaching the tip of your thumb for the small finger (pinkie) side of your palm. Hold for 10 seconds. Repeat this exercise 5-10  times with each hand. Grip strengthening  Hold a stress ball or other soft ball in the middle of your hand. Slowly increase the pressure, squeezing the ball as much as you can without causing pain. Think of bringing the tips of your fingers into the middle of your palm. All of your finger joints should bend when doing this exercise. Hold your squeeze for 10 seconds, then relax. Repeat this exercise 5-10 times with each hand. Contact a health care provider if: Your hand pain or discomfort gets much worse when you do an exercise. Your hand pain or discomfort does not improve within 2 hours after you exercise. If you have either of these problems, stop doing these exercises right away. Do not do them again unless your provider says that you can. Get help right away if: You develop sudden, severe hand pain or swelling. If this happens, stop doing these exercises right away. Do not do them again unless your provider says that you can. This information is not intended to replace advice given to you by your health care provider. Make sure you discuss any questions you have with your health care provider. Document Revised: 09/14/2022 Document Reviewed: 09/14/2022 [employer] Patient Education  2024 [employer].

## 2024-03-28 ENCOUNTER — Ambulatory Visit

## 2024-03-28 DIAGNOSIS — M6281 Muscle weakness (generalized): Secondary | ICD-10-CM | POA: Diagnosis not present

## 2024-03-28 DIAGNOSIS — R278 Other lack of coordination: Secondary | ICD-10-CM

## 2024-03-28 DIAGNOSIS — S6722XA Crushing injury of left hand, initial encounter: Secondary | ICD-10-CM

## 2024-03-30 ENCOUNTER — Ambulatory Visit: Payer: Self-pay | Admitting: Rheumatology

## 2024-03-30 NOTE — Progress Notes (Signed)
 I will discuss results at the follow-up visit.

## 2024-04-01 NOTE — Progress Notes (Signed)
 I will discuss results at the follow-up visit.

## 2024-04-01 NOTE — Therapy (Signed)
 OUTPATIENT OCCUPATIONAL THERAPY ORTHO TREATMENT NOTE  Patient Name: Lori Huber MRN: [id number] DOB:Oct 19, 1975, 48 y.o., female Today's Date: 04/01/2024  PCP: Angeline Laura, NP REFERRING PROVIDER: Angeline Laura, NP  END OF SESSION:  OT End of Session - 04/01/24 1126     Visit Number 9    Number of Visits 24    Date for OT Re-Evaluation 05/07/24    OT Start Time 0845    OT Stop Time 0930    OT Time Calculation (min) 45 min    Activity Tolerance Patient tolerated treatment well    Behavior During Therapy [facility] for tasks assessed/performed         Past Medical History:  Diagnosis Date   Anxiety    Vitamin D  deficiency    Past Surgical History:  Procedure Laterality Date   CHOLECYSTECTOMY  2011   KIDNEY STONE SURGERY  1996   KNEE SURGERY Left    Patient Active Problem List   Diagnosis Date Noted   Prediabetes 10/10/2023   Pure hypercholesterolemia 09/14/2022   Class 1 obesity due to excess calories with body mass index (BMI) of 32.0 to 32.9 in adult 09/14/2022   Generalized anxiety disorder 07/13/2019   ONSET DATE: 01/07/24  REFERRING DIAG: U11.8XXD (ICD-10-CM) - Problem with rehabilitation of hand condition, subsequent encounter   THERAPY DIAG: Muscle weakness (generalized), other lack of coordination, crushing injury of L hand, initial encounter  Rationale for Evaluation and Treatment: Rehabilitation  SUBJECTIVE:  SUBJECTIVE STATEMENT: Pt reports digi caps have further helped the swelling in her fingers, and she continues to find benefit from the compression glove.  Pt accompanied by: self  PERTINENT HISTORY: Per chart: ED  HPI 48 year old presents emergency department today with finger laceration to the left small finger and crush injury after she crushed her finger in the treadmill that she is moving.   PCP: Crush injury to fingers with sutures ER notes and imaging reviewed Crush injury with sutures in index finger. No fractures. Swelling and  bruising present. No infection signs. Tetanus up to date. Nerve pain likely from sutures and injury. - Remove sutures today. - Encourage gentle finger movements to prevent tendon fibrosis, avoiding extreme pain. - Apply Neosporin and cover with Band-Aid when using hands. - Allow wound to air dry for a few hours daily. - Avoid immersing hand in water until healed. - Advise against using a stress ball until more mobility is achieved.   Nerve issues in fingers post-injury Nerve issues in index finger and pinky post-injury. Recovery expected to take months to a year. Good blood flow, significant scarring anticipated. - Monitor nerve recovery over the coming months. - Avoid excessive strain on fingers to prevent further nerve damage.   PRECAUTIONS: Other: avoid excessive strain to fingers to prevent further nerve damage; monitor for s/s of infection as wounds heal  RED FLAGS: None   WEIGHT BEARING RESTRICTIONS: No  PAIN: 03/28/24: 0-4/10 pain L hand Are you having pain? Yes: NPRS scale: 1-2 at rest, up to 4-6/10 with activity  Pain location: L hand digits 2-5, IF and SF the most sore, soreness at the dorsal and volar MCPs Pain description: Sore, tingly, stiff, achy, sharp if stretching, tingling mostly in the dorsal digits and hand, minimal tingling on the volar side of the hand   Aggravating factors: activity  Relieving factors: rest and ibuprofen   FALLS: Has patient fallen in last 6 months? 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   NEXT MD VISIT: No follow up scheduled at this time; return as needed   OBJECTIVE:  Note: Objective measures were completed at Evaluation unless otherwise noted.  HAND DOMINANCE: Right  ADLs: Eating: difficulty cutting meat; difficulty  stabilizing the fork in L hand with digits 1, 3, and 4 Grooming: using only L thumb, LF, and RF to tie up hair in pony tail; not yet able to pinch a hair clip Upper body dressing: must front hook bra currently, baseline is back hooking but not yet able  Lower body dressing: unable to tie shoe laces tightly  Toileting: predominantly hiking/lowering pants with little engagement of L hand, using thumb only  Bathing: washing hair mostly 1 handed, if L hand is engaged only L thumb, LF, and RF are used  Tub shower transfers: indep/no difficulties  FUNCTIONAL OUTCOME MEASURES: Upper Extremity Functional Scale (UEFS): 56/80  UPPER EXTREMITY ROM:     Active ROM Right eval Left eval  Wrist flexion WNL WNL  Wrist extension WNL WNL  Wrist ulnar deviation WNL WNL  Wrist radial deviation WNL WNL  Wrist pronation WNL WNL  Wrist supination WNL WNL  (Blank rows = not tested)  L hand presents with full extension in each digit; measures below note flexion limitations:  Active ROM Right eval Left eval  Thumb MCP (0-60)    Thumb IP (0-80)    Thumb Radial abd/add (0-55)     Thumb Palmar abd/add (0-45)     Thumb Opposition to Small Finger     Index MCP (0-90)   70 (90)  Index PIP (0-100)   60 (5)  Index DIP (0-70)   20 (45)  Long MCP (0-90)      Long PIP (0-100)      Long DIP (0-70)      Ring MCP (0-90)      Ring PIP (0-100)      Ring DIP (0-70)      Little MCP (0-90)   30 (70)  Little PIP (0-100)    20 (40)  Little DIP (0-70)    10 (25)  (Blank rows = not tested)  L hand able to oppose all digits to thumb with soreness in the L SF L IF tip to palm: 7 cm; L SF unable to initiate sufficient flexion at the PIP to attempt tip to palm  Passively L SF 2 cm tip to palm, actively 2.5 cm and opposition to SF without pain.   UPPER EXTREMITY MMT: Proximal BUEs WNL; see below for hand weakness   HAND FUNCTION: Grip strength: Right: 65 lbs; Left: 13 lbs, Lateral pinch: Right: 13 lbs, Left: 10  lbs, and 3 point pinch: Right: 14 lbs, Left: 5 lbs (Grip strength measured engaging only the L thumb, 3rd, and 4th digits, and lateral pinch measured with extended IF)  COORDINATION: 9 Hole Peg test: Right: 17 sec; Left: 26 sec  SENSATION: Tingling/numbness in the dorsal aspect of digits of L hand (IF and SF most severe), less tingling on volar aspect of all digits   EDEMA: Circumferential: L IF measured between PIP and MCP joints 7.2 cm, R 7.0 cm         L SF measured between PIP and MCP joints 6.4 cm , L 5.8 cm  Pt reports being newly able to wear her wedding ring on the L RF, but it feels tight  COGNITION: Overall cognitive status: Within functional limits for tasks assessed  OBSERVATIONS:  L IF suture site healing without s/s of infection, eschar present L LF with eschar present at the L SF DIP at the ulnar/volar aspect and without s/s of infection.   TREATMENT DATE: 03/28/24 Moist heat to L hand used intermittently throughout session for pain management/muscle relaxation in prep for activities noted below.  Manual Therapy:  -Scar massage completed for flattening, desensitizing, and mobilizing scar tissue to reduce adhesions at L SF scar which partially overlaps PIP joint.    Therapeutic Exercise: -Reps of gentle passive/active assisted fisting digits 1-5 alternated by full digit ext  -Passive joint blocking stretches with gentle distraction for increasing PIP and DIP flexion of SF  -Progressive passive digit flexion beginning with fingertips to base of palm, progressing to proximal and distal palmar creases  Self Care: -Reviewed pt's recent L hand xray with pt (xray previously scheduled before injury) -Digicaps clipped to become open fingered to increase dexterity when wearing; reviewed recommended wearing schedule of digicaps and compression glove  -Reinforced slowing grasp/reaching/FM tasks to increase awareness to engaging L SF and IF into tasks as able.   PATIENT  EDUCATION: Education details: HEP progression, scar massage, use of digicaps Person educated: Patient Education method: Explanation, Demonstration, and Verbal cues Education comprehension: verbalized understanding and verbal cues required  HOME EXERCISE PROGRAM: -L hand digit opposition, AROM/PROM of digits 1-5 within tolerable (minimal pain) range -don compression glove intermittently throughout the day -Digit blocking, tendon glides, A/P/AAROM L hand digits  -yellow theraputty  GOALS: Goals reviewed with patient? Yes  SHORT TERM GOALS: Target date: 03/26/24  Pt will be indep to perform HEP for improving L hand flexibility, strength, and coordination. Baseline: Eval: HEP initiated this date; further training and progression in upcoming visits Goal status: INITIAL  2.  Pt will verbalize and implement 1-2 edema management strategies to reduce edema in L hand digits.  Baseline: Eval: Pt currently elevating hand and making attempts at AROM; further training needed Goal status: INITIAL  LONG TERM GOALS: Target date: 05/07/24  Pt will increase MAM-20 score for musculoskeletal conditions by 10 or more points to indicate improvement in self perceived functional use of the L hand for daily tasks.  Baseline: Eval: 56/80 Goal status: INITIAL  2.  Pt will increase L hand flexibility to enable closed fist of all digits for securely holding and transporting light ADL supplies without dropping. Baseline: Eval: Pt unable to make a fist d/t flexion limitations in L IF and SF (see above measures) Goal status: INITIAL  3.  Pt will tolerate manual therapy, therapeutic modalities, and exercises to decrease pain in L hand to a reported 2/10 pain or less with activity.  Baseline: Eval: 6/10 pain with activity (1-2/10 at rest) Goal status: INITIAL  4.  Pt will increase L grip strength by 10 or more lbs to securely pick up and sip from a cup with L hand. Baseline: Eval: L grip 13 lbs (R 65 lbs) Goal  status: INITIAL  5.  Pt will increase L lateral pinch strength by 3 or more lbs to enable pt to stabilize a fork in L hand for cutting meat with greater ease. Baseline: Eval: L 10 lbs (R 13 lbs) Goal status: INITIAL  ASSESSMENT:  CLINICAL IMPRESSION: Pt continues to make steady gains with L IF and SF flexibility, and is  increasing her attention and efforts to engaging these digits into daily tasks.  Continued focus needed on mobilization of scar tissue which partially overlaps at the L SF volar aspect of PIP joint d/t this limiting PIP active and passive flexion, though improving.  Plan to reduce OT to every other week and will reassess progress with 10 visit progress update next session.  Pt will continue to benefit from skilled OT to address L hand edema, pain, stiffness, lack of coordination, and weakness in order to work towards improved functional use of the L hand with daily tasks.   PERFORMANCE DEFICITS: in functional skills including ADLs, IADLs, coordination, dexterity, sensation, edema, ROM, strength, pain, fascial restrictions, flexibility, Fine motor control, decreased knowledge of precautions, decreased knowledge of use of DME, wound, skin integrity, and UE functional use, and psychosocial skills including coping strategies and routines and behaviors.   IMPAIRMENTS: are limiting patient from ADLs, IADLs, rest and sleep, work, and leisure.   COMORBIDITIES: may have co-morbidities  that affects occupational performance. Patient will benefit from skilled OT to address above impairments and improve overall function.  MODIFICATION OR ASSISTANCE TO COMPLETE EVALUATION: No modification of tasks or assist necessary to complete an evaluation.  OT OCCUPATIONAL PROFILE AND HISTORY: Detailed assessment: Review of records and additional review of physical, cognitive, psychosocial history related to current functional performance.  CLINICAL DECISION MAKING: Moderate - several treatment options,  min-mod task modification necessary  REHAB POTENTIAL: Good  EVALUATION COMPLEXITY: Moderate    PLAN:  OT FREQUENCY: 2x/week  OT DURATION: 12 weeks  PLANNED INTERVENTIONS: 97168 OT Re-evaluation, 97535 self care/ADL training, 02889 therapeutic exercise, 97530 therapeutic activity, 97112 neuromuscular re-education, 97140 manual therapy, 97018 paraffin, 02989 moist heat, 97010 cryotherapy, 97034 contrast bath, 97760 Orthotic Initial, 97763 Orthotic/Prosthetic subsequent, scar mobilization, passive range of motion, psychosocial skills training, coping strategies training, patient/family education, and DME and/or AE instructions  RECOMMENDED OTHER SERVICES: None at this time  CONSULTED AND AGREED WITH PLAN OF CARE: Patient  PLAN FOR NEXT SESSION: A/PROM  Inocente Blazing, MS, OTR/L  Inocente MARLA Blazing, OT 04/01/2024, 11:29 AM

## 2024-04-02 ENCOUNTER — Ambulatory Visit

## 2024-04-02 LAB — C3 AND C4
C3 Complement: 176 mg/dL (ref 83–193)
C4 Complement: 32 mg/dL (ref 15–57)

## 2024-04-02 LAB — ANTI-SCLERODERMA ANTIBODY: Scleroderma (Scl-70) (ENA) Antibody, IgG: <1 AI

## 2024-04-02 LAB — COMPREHENSIVE METABOLIC PANEL WITH GFR
AG Ratio: 1.5 (calc) (ref 1.0–2.5)
ALT: 26 U/L (ref 6–29)
AST: 16 U/L (ref 10–35)
Albumin: 4.7 g/dL (ref 3.6–5.1)
Alkaline phosphatase (APISO): 87 U/L (ref 37–153)
BUN: 22 mg/dL (ref 7–25)
CO2: 27 mmol/L (ref 20–32)
Calcium: 10.2 mg/dL (ref 8.6–10.4)
Chloride: 101 mmol/L (ref 98–110)
Creat: 0.81 mg/dL (ref 0.50–1.03)
Globulin: 3.1 g/dL (ref 1.9–3.7)
Glucose, Bld: 80 mg/dL (ref 65–99)
Potassium: 4.3 mmol/L (ref 3.5–5.3)
Sodium: 138 mmol/L (ref 135–146)
Total Bilirubin: 1.1 mg/dL (ref 0.2–1.2)
Total Protein: 7.8 g/dL (ref 6.1–8.1)
eGFR: 88 mL/min/1.73m2 (ref >=60)

## 2024-04-02 LAB — PROTEIN ELECTROPHORESIS, SERUM, WITH REFLEX
Albumin ELP: 4.6 g/dL (ref 3.8–4.8)
Alpha 1: 0.3 g/dL (ref 0.2–0.3)
Alpha 2: 0.8 g/dL (ref 0.5–0.9)
Beta 2: 0.4 g/dL (ref 0.2–0.5)
Beta Globulin: 0.5 g/dL (ref 0.4–0.6)
Gamma Globulin: 1.3 g/dL (ref 0.8–1.7)
Total Protein: 7.9 g/dL (ref 6.1–8.1)

## 2024-04-02 LAB — ANTI-NUCLEAR AB-TITER (ANA TITER): ANA Titer 1: 1:160 {titer} — ABNORMAL HIGH

## 2024-04-02 LAB — TSH: TSH: 2.51 m[IU]/L

## 2024-04-02 LAB — RNP ANTIBODY: Ribonucleic Protein(ENA) Antibody, IgG: <1 AI

## 2024-04-02 LAB — VITAMIN D 25 HYDROXY (VIT D DEFICIENCY, FRACTURES): Vit D, 25-Hydroxy: 41 ng/mL (ref 30–100)

## 2024-04-02 LAB — SJOGRENS SYNDROME-A EXTRACTABLE NUCLEAR ANTIBODY: SSA (Ro) (ENA) Antibody, IgG: <1 AI

## 2024-04-02 LAB — SJOGRENS SYNDROME-B EXTRACTABLE NUCLEAR ANTIBODY: SSB (La) (ENA) Antibody, IgG: <1 AI

## 2024-04-02 LAB — ANTI-DNA ANTIBODY, DOUBLE-STRANDED: ds DNA Ab: <1 [IU]/mL

## 2024-04-02 LAB — ANA: Anti Nuclear Antibody (ANA): POSITIVE — AB

## 2024-04-02 LAB — SEDIMENTATION RATE: Sed Rate: 14 mm/h (ref 0–20)

## 2024-04-02 LAB — RHEUMATOID FACTOR: Rheumatoid fact SerPl-aCnc: <10 [IU]/mL (ref <14)

## 2024-04-02 LAB — ANTI-SMITH ANTIBODY: ENA SM Ab Ser-aCnc: <1 AI

## 2024-04-02 LAB — CK: Total CK: 42 U/L (ref 21–240)

## 2024-04-02 LAB — URIC ACID: Uric Acid, Serum: 6.3 mg/dL (ref 2.5–7.0)

## 2024-04-02 LAB — CYCLIC CITRUL PEPTIDE ANTIBODY, IGG: Cyclic Citrullin Peptide Ab: <16 U

## 2024-04-03 NOTE — Progress Notes (Signed)
 ANA is positive, all other labs are within normal limits.  I will discuss results at the follow-up visit.

## 2024-04-04 ENCOUNTER — Ambulatory Visit

## 2024-04-09 ENCOUNTER — Ambulatory Visit: Payer: Self-pay | Admitting: Internal Medicine

## 2024-04-11 ENCOUNTER — Encounter

## 2024-04-11 NOTE — Progress Notes (Signed)
 Office Visit Note  Patient: Lori Huber             Date of Birth: [birth year]           MRN: [id number]             PCP: Antonette Angeline ORN, NP Referring: Antonette Angeline ORN, NP Visit Date: 04/25/2024 Occupation: @GUAROCC @  Subjective:  Pain in multiple joints  History of Present Illness: Andrya L Saltz-Spieker is a 48 y.o. female with polyarthralgia and positive ANA returns for a follow-up visit after the initial evaluation on March 27, 2024.  She states she continues to have stiffness in her neck and lower back.  She also has discomfort in her hands, hips and her feet.  She has not noticed joint swelling.  She continues to have morning stiffness and sometimes nocturnal pain.  She has difficulty getting up from the chair due to lower back discomfort.  Is no history of oral ulcers, nasal ulcers, sicca symptoms, malar rash, photosensitivity, Raynaud's or lymphadenopathy.    Activities of Daily Living:  Patient reports morning stiffness for 30 minutes.   Patient Reports nocturnal pain.  Difficulty dressing/grooming: Reports Difficulty climbing stairs: Denies Difficulty getting out of chair: Reports Difficulty using hands for taps, buttons, cutlery, and/or writing: Denies  Review of Systems  Constitutional:  Positive for fatigue.  HENT:  Negative for mouth sores and mouth dryness.   Eyes:  Negative for dryness.  Respiratory:  Negative for shortness of breath.   Cardiovascular:  Negative for chest pain and palpitations.  Gastrointestinal:  Negative for blood in stool, constipation and diarrhea.  Endocrine: Negative for increased urination.  Genitourinary:  Negative for involuntary urination.  Musculoskeletal:  Positive for joint pain, gait problem, joint pain, myalgias, morning stiffness, muscle tenderness and myalgias. Negative for joint swelling and muscle weakness.  Skin:  Negative for color change, rash, hair loss and sensitivity to sunlight.  Allergic/Immunologic: Positive for  susceptible to infections.  Neurological:  Positive for headaches. Negative for dizziness.  Hematological:  Negative for swollen glands.  Psychiatric/Behavioral:  Positive for depressed mood and sleep disturbance. The patient is nervous/anxious.     PMFS History:  Patient Active Problem List   Diagnosis Date Noted   Prediabetes 10/10/2023   Pure hypercholesterolemia 09/14/2022   Class 1 obesity due to excess calories with body mass index (BMI) of 32.0 to 32.9 in adult 09/14/2022   Generalized anxiety disorder 07/13/2019    Past Medical History:  Diagnosis Date   Anxiety    Vitamin D  deficiency     Family History  Problem Relation Age of Onset   Hypertension Father    Hypertension Brother    Hypertension Paternal Grandmother    Stroke Paternal Grandmother    Heart disease Paternal Grandmother    Diabetes Paternal Grandmother    Breast cancer Neg Hx    Past Surgical History:  Procedure Laterality Date   CHOLECYSTECTOMY  2011   KIDNEY STONE SURGERY  1996   KNEE SURGERY Left    Social History   Social History Narrative   Not on file   Immunization History  Administered Date(s) Administered   Influenza Inj Mdck Quad Pf 07/06/2019, 07/06/2021   Influenza, Mdck, Trivalent,PF 6+ MOS(egg free) 07/12/2023   Influenza, Quadrivalent, Recombinant, Inj, Pf 07/17/2018   Influenza-Unspecified 06/18/2020   Td 09/13/1994   Tdap 07/06/2019     Objective: Vital Signs: BP 113/81 (BP Location: Left Arm, Patient Position: Sitting, Cuff Size: Large)  Pulse 97   Resp 14   Ht 5' 5.5 (1.664 m)   Wt 240 lb 9.6 oz (109.1 kg)   BMI 39.43 kg/m    Physical Exam Vitals and nursing note reviewed.  Constitutional:      Appearance: She is well-developed.  HENT:     Head: Normocephalic and atraumatic.  Eyes:     Conjunctiva/sclera: Conjunctivae normal.  Cardiovascular:     Rate and Rhythm: Normal rate and regular rhythm.     Heart sounds: Normal heart sounds.  Pulmonary:      Effort: Pulmonary effort is normal.     Breath sounds: Normal breath sounds.  Abdominal:     General: Bowel sounds are normal.     Palpations: Abdomen is soft.  Musculoskeletal:     Cervical back: Normal range of motion.  Lymphadenopathy:     Cervical: No cervical adenopathy.  Skin:    General: Skin is warm and dry.     Capillary Refill: Capillary refill takes less than 2 seconds.  Neurological:     Mental Status: She is alert and oriented to person, place, and time.  Psychiatric:        Behavior: Behavior normal.      Musculoskeletal Exam: Cervical, thoracic and lumbar spine were in good range of motion.  She had discomfort range of motion of the lumbar spine.  Shoulders, elbows, wrist joints, MCPs PIPs and DIPs were in good range of motion with no synovitis.  Hip joints, knee joints in good range of motion without any warmth swelling or effusion.  CDAI Exam: CDAI Score: -- Patient Global: --; Provider Global: -- Swollen: --; Tender: -- Joint Exam 04/25/2024   No joint exam has been documented for this visit   There is currently no information documented on the homunculus. Go to the Rheumatology activity and complete the homunculus joint exam.  Investigation: No additional findings.  Imaging: XR Foot 2 Views Left Result Date: 03/27/2024 First MTP, PIP and DIP narrowing was noted.  No intertarsal, tibiotalar or subtalar joint space narrowing was noted.  No erosive changes were noted. 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  XR Foot 2 Views Right Result Date: 03/27/2024 First MTP, PIP and DIP narrowing was noted.  No intertarsal, tibiotalar or subtalar joint space narrowing was noted.  No erosive changes were noted. 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  XR Lumbar Spine 2-3 Views Result Date: 03/27/2024 No significant disc space narrowing was noted.  Anterior spurring was noted.  Facet joint  arthropathy was noted.  No SI joint sclerosis or narrowing was noted. Impression: These findings are suggestive of facet joint arthropathy and lumbar spondylosis.  XR Cervical Spine 2 or 3 views Result Date: 03/27/2024 Multilevel spondylosis was noted.  C3 on C4 spondylolisthesis was noted.  Anterior spurring was noted.  Facet joint arthropathy was noted.  Most significant narrowing was noted between C5-C6. Impression: These findings suggestive of multilevel spondylosis and facet joint arthropathy.  XR Hand 2 View Left Result Date: 03/27/2024 Mild PIP and DIP narrowing was noted.  No CMC, MCP, intercarpal or radiocarpal joint space narrowing was noted.  No erosive changes were noted. Impression: These findings with history of of early osteoarthritis of the hand.  XR Hand 2 View Right Result Date: 03/27/2024 Mild PIP and DIP narrowing was noted.  No CMC, MCP, intercarpal or radiocarpal joint space narrowing was noted.  No erosive changes were noted. Impression: These  findings with history of of early osteoarthritis of the hand.   Recent Labs: Lab Results  Component Value Date   WBC 6.6 10/10/2023   HGB 14.7 10/10/2023   PLT 253 10/10/2023   NA 138 03/27/2024   K 4.3 03/27/2024   CL 101 03/27/2024   CO2 27 03/27/2024   GLUCOSE 80 03/27/2024   BUN 22 03/27/2024   CREATININE 0.81 03/27/2024   BILITOT 1.1 03/27/2024   ALKPHOS 55 01/07/2018   AST 16 03/27/2024   ALT 26 03/27/2024   PROT 7.9 03/27/2024   PROT 7.8 03/27/2024   ALBUMIN 3.8 01/07/2018   CALCIUM 10.2 03/27/2024   GFRAA >60 01/07/2018   March 27, 2024 SPEP normal, CK 42, TSH normal, uric acid 6.3, RF negative, anti-CCP negative, ANA 1: 160 nuclear, nucleolar, ENA panel (SCL 70, RNP, Smith, SSA, SSB, dsDNA) negative, sed rate 14, vitamin D  41  Speciality Comments: No specialty comments available.  Procedures:  No procedures performed Allergies: Zoloft  [sertraline ], Egg-derived products, Ciprofloxacin , and Flagyl   [metronidazole ]   Assessment / Plan:     Visit Diagnoses: Positive ANA (antinuclear antibody) - ANA low titer positive, ENA negative, complements normal, history of fatigue and arthralgias.  I did detailed discussion with the patient regarding the labs.  ANA is low titer positive and not significant.  She has no clinical features of lupus or related diseases.  Pain in both hands - h/o crush injury to the left hand.  X-rays obtained at the last visit were suggestive of osteoarthritis.  X-ray findings were reviewed with the patient today.  Joint protection muscle strengthening was discussed.  A handout on hand exercises was given.  Pain in both feet - Hammertoes noted.  No synovitis noted.  X-rays obtained at the last visit were suggestive of early degenerative changes.  No synovitis was noted.  X-ray findings were reviewed with the patient.  Proper fitting shoes were advised.  DDD (degenerative disc disease), cervical - Chronic pain.  X-rays obtained at the last visit: C3-C4 spondylolisthesis, C5-C6 narrowing, facet joint arthropathy.  X-ray was reviewed with the patient.  I offered physical therapy which she declined.  A handout on C-spine exercises was given.  Chronic midline low back pain without sciatica - Early degenerative changes were noted in the lumbar spine.  X-ray findings were reviewed with the patient.  A handout on exercise was given.  Patient declined physical therapy.  Some of the exercises were demonstrated in the office.  Myofascial pain - Generalized pain, arthralgias and myalgias.  Need for regular exercise and stretching was emphasized.  Benefits of sleep hygiene and stretching was discussed.  Other fatigue-  Pure hypercholesterolemia  Prediabetes  Vitamin D  deficiency-vitamin D  level was normal at 41.  Generalized anxiety disorder  BMI 40.0-44.9, adult (HCC)  Orders: No orders of the defined types were placed in this encounter.  No orders of the defined types were  placed in this encounter.    Follow-Up Instructions: Return if symptoms worsen or fail to improve, for Osteoarthritis.   Maya Nash, MD  Note - This record has been created using Animal nutritionist.  Chart creation errors have been sought, but may not always  have been located. Such creation errors do not reflect on  the standard of medical care.

## 2024-04-13 ENCOUNTER — Encounter

## 2024-04-16 ENCOUNTER — Ambulatory Visit: Payer: Managed Care, Other (non HMO) | Admitting: Rheumatology

## 2024-04-18 ENCOUNTER — Ambulatory Visit: Attending: Internal Medicine

## 2024-04-18 DIAGNOSIS — M6281 Muscle weakness (generalized): Secondary | ICD-10-CM | POA: Insufficient documentation

## 2024-04-18 DIAGNOSIS — R278 Other lack of coordination: Secondary | ICD-10-CM | POA: Diagnosis present

## 2024-04-18 DIAGNOSIS — S6722XA Crushing injury of left hand, initial encounter: Secondary | ICD-10-CM | POA: Diagnosis present

## 2024-04-18 NOTE — Therapy (Signed)
 OUTPATIENT OCCUPATIONAL THERAPY ORTHO PROGRESS/DISCHARGE NOTE  Patient Name: SHONTAY WALLNER MRN: [id number] DOB:1976-02-23, 48 y.o., female Today's Date: 04/21/2024  PCP: Angeline Laura, NP REFERRING PROVIDER: Angeline Laura, NP  END OF SESSION:  OT End of Session - 04/21/24 1844     Visit Number 10    Number of Visits 24    Date for OT Re-Evaluation 05/07/24    OT Start Time 1100    OT Stop Time 1145    OT Time Calculation (min) 45 min    Activity Tolerance Patient tolerated treatment well    Behavior During Therapy [facility] for tasks assessed/performed          Past Medical History:  Diagnosis Date   Anxiety    Vitamin D  deficiency    Past Surgical History:  Procedure Laterality Date   CHOLECYSTECTOMY  2011   KIDNEY STONE SURGERY  1996   KNEE SURGERY Left    Patient Active Problem List   Diagnosis Date Noted   Prediabetes 10/10/2023   Pure hypercholesterolemia 09/14/2022   Class 1 obesity due to excess calories with body mass index (BMI) of 32.0 to 32.9 in adult 09/14/2022   Generalized anxiety disorder 07/13/2019   ONSET DATE: 01/07/24  REFERRING DIAG: U11.8XXD (ICD-10-CM) - Problem with rehabilitation of hand condition, subsequent encounter   THERAPY DIAG: Muscle weakness (generalized), other lack of coordination, crushing injury of L hand, initial encounter  Rationale for Evaluation and Treatment: Rehabilitation  SUBJECTIVE:  SUBJECTIVE STATEMENT: Pt reports that she's doing more typing engaging the L 5th digit more naturally. Pt accompanied by: self  PERTINENT HISTORY: Per chart: ED  HPI 48 year old presents emergency department today with finger laceration to the left small finger and crush injury after she crushed her finger in the treadmill that she is moving.   PCP: Crush injury to fingers with sutures ER notes and imaging reviewed Crush injury with sutures in index finger. No fractures. Swelling and bruising present. No infection signs. Tetanus up  to date. Nerve pain likely from sutures and injury. - Remove sutures today. - Encourage gentle finger movements to prevent tendon fibrosis, avoiding extreme pain. - Apply Neosporin and cover with Band-Aid when using hands. - Allow wound to air dry for a few hours daily. - Avoid immersing hand in water until healed. - Advise against using a stress ball until more mobility is achieved.   Nerve issues in fingers post-injury Nerve issues in index finger and pinky post-injury. Recovery expected to take months to a year. Good blood flow, significant scarring anticipated. - Monitor nerve recovery over the coming months. - Avoid excessive strain on fingers to prevent further nerve damage.   PRECAUTIONS: Other: avoid excessive strain to fingers to prevent further nerve damage; monitor for s/s of infection as wounds heal  RED FLAGS: None   WEIGHT BEARING RESTRICTIONS: No  PAIN: 04/18/24: 0-2/10 pain L hand with activity  Are you having pain? Yes: NPRS scale: 1-2 at rest, up to 4-6/10 with activity  Pain location: L hand digits 2-5, IF and SF the most sore, soreness at the dorsal and volar MCPs Pain description: Sore, tingly, stiff, achy, sharp if stretching, tingling mostly in the dorsal digits and hand, minimal tingling on the volar side of the hand   Aggravating factors: activity  Relieving factors: rest and ibuprofen   FALLS: Has patient fallen in last 6 months? 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   NEXT MD VISIT: No follow up scheduled at this time; return as needed   OBJECTIVE:  Note: Objective measures were completed at Evaluation unless otherwise noted.  HAND DOMINANCE: Right  ADLs: Eating: difficulty cutting meat; difficulty stabilizing the fork in L hand with digits 1,  3, and 4 Grooming: using only L thumb, LF, and RF to tie up hair in pony tail; not yet able to pinch a hair clip Upper body dressing: must front hook bra currently, baseline is back hooking but not yet able  Lower body dressing: unable to tie shoe laces tightly  Toileting: predominantly hiking/lowering pants with little engagement of L hand, using thumb only  Bathing: washing hair mostly 1 handed, if L hand is engaged only L thumb, LF, and RF are used  Tub shower transfers: indep/no difficulties  FUNCTIONAL OUTCOME MEASURES: MAM-20 for musculoskeletal conditions: 56/80 04/18/24: 69/80  UPPER EXTREMITY ROM:     Active ROM Right eval Left eval  Wrist flexion WNL WNL  Wrist extension WNL WNL  Wrist ulnar deviation WNL WNL  Wrist radial deviation WNL WNL  Wrist pronation WNL WNL  Wrist supination WNL WNL  (Blank rows = not tested)  L hand presents with full extension in each digit; measures below note flexion limitations:  Active ROM Right eval Left eval 04/18/24  Thumb MCP (0-60)     Thumb IP (0-80)     Thumb Radial abd/add (0-55)      Thumb Palmar abd/add (0-45)      Thumb Opposition to Small Finger      Index MCP (0-90)   70 (90) 90  Index PIP (0-100)   60 (5) 105  Index DIP (0-70)   20 (45) 60 (80)  Long MCP (0-90)       Long PIP (0-100)       Long DIP (0-70)       Ring MCP (0-90)       Ring PIP (0-100)       Ring DIP (0-70)       Little MCP (0-90)   30 (70) 90  Little PIP (0-100)    20 (40) 70 (80)  Little DIP (0-70)    10 (25) 45 (50)  (Blank rows = not tested)  L hand able to oppose all digits to thumb with soreness in the L SF; 04/18/24: Full opposition to each digit without pain   L IF tip to palm: 7 cm; L SF unable to initiate sufficient flexion at the PIP to attempt tip to palm; 04/18/24: L IF able to fully touch tip to palm,  Passively L SF 2 cm tip to palm, actively 2.5 cm and opposition to SF without pain; L SF able to touch tip to palm with limited PIP flexion  (touches lower aspect of palm actively ); able to oppose all digits to thumb without pain   UPPER EXTREMITY MMT: Proximal BUEs WNL; see below for hand weakness   HAND FUNCTION: Grip strength: Right: 65 lbs; Left: 13 lbs, Lateral pinch: Right: 13 lbs, Left: 10 lbs, and 3 point pinch: Right: 14 lbs, Left: 5 lbs (Grip strength measured engaging only the L thumb, 3rd, and 4th digits, and lateral pinch measured with extended IF) Grip strength: Left: 35 lbs; lateral pinch: Left: 10 lbs; 3 point pinch: Left: 11 lbs   COORDINATION:  9 Hole Peg test: Right: 17 sec; Left: 26 sec  SENSATION: Tingling/numbness in the dorsal aspect of digits of L hand (IF and SF most severe), less tingling on volar aspect of all digits   EDEMA: Circumferential: L IF measured between PIP and MCP joints 7.2 cm, R 7.0 cm         L SF measured between PIP and MCP joints 6.4 cm , L 5.8 cm         Pt reports being newly able to wear her wedding ring on the L RF, but it feels tight  COGNITION: Overall cognitive status: Within functional limits for tasks assessed  OBSERVATIONS:  L IF suture site healing without s/s of infection, eschar present L LF with eschar present at the L SF DIP at the ulnar/volar aspect and without s/s of infection.   TREATMENT DATE: 04/18/24 Therapeutic Exercise: -HEP review: continue to focus on PROM/AROM and digit blocking at the L SF DIP and PIP joints -Issued pink theraputty for upgrade/progressing L grip strength  Therapeutic Activity: -Objective measures taken and goals updated and reviewed for discharge summary.  PATIENT EDUCATION: Education details: HEP progression, progress towards goals Person educated: Patient Education method: Explanation, Demonstration, and Verbal cues Education comprehension: verbalized understanding and returned demonstration  HOME EXERCISE PROGRAM: -L hand digit opposition, AROM/PROM of digits 1-5 within tolerable (minimal pain) range -don compression glove  intermittently throughout the day -Digit blocking, tendon glides, A/P/AAROM L hand digits  -yellow theraputty; 04/18/24: upgraded to pink  GOALS: Goals reviewed with patient? Yes  SHORT TERM GOALS: Target date: 03/26/24  Pt will be indep to perform HEP for improving L hand flexibility, strength, and coordination. Baseline: Eval: HEP initiated this date; further training and progression in upcoming visits; 04/18/24: indep Goal status: achieved  2.  Pt will verbalize and implement 1-2 edema management strategies to reduce edema in L hand digits.  Baseline: Eval: Pt currently elevating hand and making attempts at AROM; further training needed; 04/18/24: pt wears compression glove and digi-caps for edema management as needed, and continues to focus on AROM for management of L hand edema  Goal status: achieved  LONG TERM GOALS: Target date: 05/07/24  Pt will increase MAM-20 score for musculoskeletal conditions by 10 or more points to indicate improvement in self perceived functional use of the L hand for daily tasks.  Baseline: Eval: 56/80; 04/18/24: 69/80 Goal status: achieved  2.  Pt will increase L hand flexibility to enable closed fist of all digits for securely holding and transporting light ADL supplies without dropping. Baseline: Eval: Pt unable to make a fist d/t flexion limitations in L IF and SF (see above measures); 04/18/24: All to make full composite fist Goal status: achieved  3.  Pt will tolerate manual therapy, therapeutic modalities, and exercises to decrease pain in L hand to a reported 2/10 pain or less with activity.  Baseline: Eval: 6/10 pain with activity (1-2/10 at rest); 04/18/24: Pain consistently 0-2/10 pain in L hand with activity  Goal status: achieved  4.  Pt will increase L grip strength by 10 or more lbs to securely pick up and sip from a cup with L hand. Baseline: Eval: L grip 13 lbs (R 65 lbs); 04/18/24: L grip 35 lbs Goal status: achieved  5.  Pt will increase L  lateral pinch strength by 3 or more lbs to enable pt to stabilize a fork in L hand for cutting meat with greater ease. Baseline: Eval: L 10 lbs (R  13 lbs); 04/18/24: L 10 lbs, but improved ability to cut meat as a result of better digit flexibility  Goal status: partially/sufficiently achieved  ASSESSMENT: CLINICAL IMPRESSION: OT d/c this date.  All goals met with pt indicating satisfaction with continued improvements in functional use of her L hand with home and work activities.  Pt has made excellent gains in ROM and strength in the L hand, and is indep with HEP to enable continued focus on scar massage and increasing passive and active ROM of the L IF DIP and PIP joints.  No further skilled OT indicated at this time.  Pt in agreement with plan.    PERFORMANCE DEFICITS: in functional skills including ADLs, IADLs, coordination, dexterity, sensation, edema, ROM, strength, pain, fascial restrictions, flexibility, Fine motor control, decreased knowledge of precautions, decreased knowledge of use of DME, wound, skin integrity, and UE functional use, and psychosocial skills including coping strategies and routines and behaviors.   IMPAIRMENTS: are limiting patient from ADLs, IADLs, rest and sleep, work, and leisure.   COMORBIDITIES: may have co-morbidities  that affects occupational performance. Patient will benefit from skilled OT to address above impairments and improve overall function.  MODIFICATION OR ASSISTANCE TO COMPLETE EVALUATION: No modification of tasks or assist necessary to complete an evaluation.  OT OCCUPATIONAL PROFILE AND HISTORY: Detailed assessment: Review of records and additional review of physical, cognitive, psychosocial history related to current functional performance.  CLINICAL DECISION MAKING: Moderate - several treatment options, min-mod task modification necessary  REHAB POTENTIAL: Good  EVALUATION COMPLEXITY: Moderate    PLAN:  OT FREQUENCY: 2x/week  OT  DURATION: 12 weeks  PLANNED INTERVENTIONS: 97168 OT Re-evaluation, 97535 self care/ADL training, 02889 therapeutic exercise, 97530 therapeutic activity, 97112 neuromuscular re-education, 97140 manual therapy, 97018 paraffin, 02989 moist heat, 97010 cryotherapy, 97034 contrast bath, 97760 Orthotic Initial, 97763 Orthotic/Prosthetic subsequent, scar mobilization, passive range of motion, psychosocial skills training, coping strategies training, patient/family education, and DME and/or AE instructions  RECOMMENDED OTHER SERVICES: None at this time  CONSULTED AND AGREED WITH PLAN OF CARE: Patient  PLAN FOR NEXT SESSION: N/A; d/c this visit   Inocente Blazing, MS, OTR/L  Inocente MARLA Blazing, OT 04/21/2024, 6:46 PM

## 2024-04-20 ENCOUNTER — Ambulatory Visit

## 2024-04-25 ENCOUNTER — Ambulatory Visit

## 2024-04-25 ENCOUNTER — Encounter: Payer: Self-pay | Admitting: Rheumatology

## 2024-04-25 ENCOUNTER — Ambulatory Visit: Attending: Rheumatology | Admitting: Rheumatology

## 2024-04-25 VITALS — BP 113/81 | HR 97 | Resp 14 | Ht 65.5 in | Wt 240.6 lb

## 2024-04-25 DIAGNOSIS — M79642 Pain in left hand: Secondary | ICD-10-CM

## 2024-04-25 DIAGNOSIS — F411 Generalized anxiety disorder: Secondary | ICD-10-CM

## 2024-04-25 DIAGNOSIS — R7303 Prediabetes: Secondary | ICD-10-CM

## 2024-04-25 DIAGNOSIS — M79641 Pain in right hand: Secondary | ICD-10-CM

## 2024-04-25 DIAGNOSIS — M79672 Pain in left foot: Secondary | ICD-10-CM

## 2024-04-25 DIAGNOSIS — R5383 Other fatigue: Secondary | ICD-10-CM

## 2024-04-25 DIAGNOSIS — M79671 Pain in right foot: Secondary | ICD-10-CM

## 2024-04-25 DIAGNOSIS — M5030 Other cervical disc degeneration, unspecified cervical region: Secondary | ICD-10-CM

## 2024-04-25 DIAGNOSIS — Z6841 Body Mass Index (BMI) 40.0 and over, adult: Secondary | ICD-10-CM

## 2024-04-25 DIAGNOSIS — E7800 Pure hypercholesterolemia, unspecified: Secondary | ICD-10-CM

## 2024-04-25 DIAGNOSIS — M7918 Myalgia, other site: Secondary | ICD-10-CM

## 2024-04-25 DIAGNOSIS — E559 Vitamin D deficiency, unspecified: Secondary | ICD-10-CM

## 2024-04-25 DIAGNOSIS — M545 Low back pain, unspecified: Secondary | ICD-10-CM

## 2024-04-25 DIAGNOSIS — R768 Other specified abnormal immunological findings in serum: Secondary | ICD-10-CM

## 2024-04-25 DIAGNOSIS — G8929 Other chronic pain: Secondary | ICD-10-CM

## 2024-04-25 NOTE — Patient Instructions (Signed)
Cervical Strain and Sprain Rehab Ask your health care provider which exercises are safe for you. Do exercises exactly as told by your health care provider and adjust them as directed. It is normal to feel mild stretching, pulling, tightness, or discomfort as you do these exercises. Stop right away if you feel sudden pain or your pain gets worse. Do not begin these exercises until told by your health care provider. Stretching and range-of-motion exercises Cervical side bending  Using good posture, sit on a stable chair or stand up. Without moving your shoulders, slowly tilt your left / right ear to your shoulder until you feel a stretch in the neck muscles on the opposite side. You should be looking straight ahead. Hold for __________ seconds. Repeat with the other side of your neck. Repeat __________ times. Complete this exercise __________ times a day. Cervical rotation  Using good posture, sit on a stable chair or stand up. Slowly turn your head to the side as if you are looking over your left / right shoulder. Keep your eyes level with the ground. Stop when you feel a stretch along the side and the back of your neck. Hold for __________ seconds. Repeat this by turning to your other side. Repeat __________ times. Complete this exercise __________ times a day. Thoracic extension and pectoral stretch  Roll a towel or a small blanket so it is about 4 inches (10 cm) in diameter. Lie down on your back on a firm surface. Put the towel in the middle of your back across your spine. It should not be under your shoulder blades. Put your hands behind your head and let your elbows fall out to your sides. Hold for __________ seconds. Repeat __________ times. Complete this exercise __________ times a day. Strengthening exercises Upper cervical flexion  Lie on your back with a thin pillow behind your head or a small, rolled-up towel under your neck. Gently tuck your chin toward your chest and nod  your head down to look toward your feet. Do not lift your head off the pillow. Hold for __________ seconds. Release the tension slowly. Relax your neck muscles completely before you repeat this exercise. Repeat __________ times. Complete this exercise __________ times a day. Cervical extension  Stand about 6 inches (15 cm) away from a wall, with your back facing the wall. Place a soft object, about 6-8 inches (15-20 cm) in diameter, between the back of your head and the wall. A soft object could be a small pillow, a ball, or a folded towel. Gently tilt your head back and press into the soft object. Keep your jaw and forehead relaxed. Hold for __________ seconds. Release the tension slowly. Relax your neck muscles completely before you repeat this exercise. Repeat __________ times. Complete this exercise __________ times a day. Posture and body mechanics Body mechanics refer to the movements and positions of your body while you do your daily activities. Posture is part of body mechanics. Good posture and healthy body mechanics can help to relieve stress in your body's tissues and joints. Good posture means that your spine is in its natural S-curve position (your spine is neutral), your shoulders are pulled back slightly, and your head is not tipped forward. The following are general guidelines for using improved posture and body mechanics in your everyday activities. Sitting  When sitting, keep your spine neutral and keep your feet flat on the floor. Use a footrest, if needed, and keep your thighs parallel to the floor. Avoid rounding  your shoulders. Avoid tilting your head forward. When working at a desk or a computer, keep your desk at a height where your hands are slightly lower than your elbows. Slide your chair under your desk so you are close enough to maintain good posture. When working at a computer, place your monitor at a height where you are looking straight ahead and you do not have to  tilt your head forward or downward to look at the screen. Standing  When standing, keep your spine neutral and keep your feet about hip-width apart. Keep a slight bend in your knees. Your ears, shoulders, and hips should line up. When you do a task in which you stand in one place for a long time, place one foot up on a stable object that is 2-4 inches (5-10 cm) high, such as a footstool. This helps keep your spine neutral. Resting When lying down and resting, avoid positions that are most painful for you. Try to support your neck in a neutral position. You can use a contour pillow or a small rolled-up towel. Your pillow should support your neck but not push on it. This information is not intended to replace advice given to you by your health care provider. Make sure you discuss any questions you have with your health care provider. Document Revised: 01/03/2023 Document Reviewed: 03/22/2022 [employer] Patient Education  2024 [employer] Low Back Sprain or Strain Rehab Ask your health care provider which exercises are safe for you. Do exercises exactly as told by your health care provider and adjust them as directed. It is normal to feel mild stretching, pulling, tightness, or discomfort as you do these exercises. Stop right away if you feel sudden pain or your pain gets worse. Do not begin these exercises until told by your health care provider. Stretching and range-of-motion exercises These exercises warm up your muscles and joints and improve the movement and flexibility of your back. These exercises also help to relieve pain, numbness, and tingling. Lumbar rotation  Lie on your back on a firm bed or the floor with your knees bent. Straighten your arms out to your sides so each arm forms a 90-degree angle (right angle) with a side of your body. Slowly move (rotate) both of your knees to one side of your body until you feel a stretch in your lower back (lumbar). Try not to let your shoulders lift  off the floor. Hold this position for __________ seconds. Tense your abdominal muscles and slowly move your knees back to the starting position. Repeat this exercise on the other side of your body. Repeat __________ times. Complete this exercise __________ times a day. Single knee to chest  Lie on your back on a firm bed or the floor with both legs straight. Bend one of your knees. Use your hands to move your knee up toward your chest until you feel a gentle stretch in your lower back and buttock. Hold your leg in this position by holding on to the front of your knee. Keep your other leg as straight as possible. Hold this position for __________ seconds. Slowly return to the starting position. Repeat with your other leg. Repeat __________ times. Complete this exercise __________ times a day. Prone extension on elbows  Lie on your abdomen on a firm bed or the floor (prone position). Prop yourself up on your elbows. Use your arms to help lift your chest up until you feel a gentle stretch in your abdomen and your lower back.  This will place some of your body weight on your elbows. If this is uncomfortable, try stacking pillows under your chest. Your hips should stay down, against the surface that you are lying on. Keep your hip and back muscles relaxed. Hold this position for __________ seconds. Slowly relax your upper body and return to the starting position. Repeat __________ times. Complete this exercise __________ times a day. Strengthening exercises These exercises build strength and endurance in your back. Endurance is the ability to use your muscles for a long time, even after they get tired. Pelvic tilt This exercise strengthens the muscles that lie deep in the abdomen. Lie on your back on a firm bed or the floor with your legs extended. Bend your knees so they are pointing toward the ceiling and your feet are flat on the floor. Tighten your lower abdominal muscles to press your  lower back against the floor. This motion will tilt your pelvis so your tailbone points up toward the ceiling instead of pointing to your feet or the floor. To help with this exercise, you may place a small towel under your lower back and try to push your back into the towel. Hold this position for __________ seconds. Let your muscles relax completely before you repeat this exercise. Repeat __________ times. Complete this exercise __________ times a day. Alternating arm and leg raises  Get on your hands and knees on a firm surface. If you are on a hard floor, you may want to use padding, such as an exercise mat, to cushion your knees. Line up your arms and legs. Your hands should be directly below your shoulders, and your knees should be directly below your hips. Lift your left leg behind you. At the same time, raise your right arm and straighten it in front of you. Do not lift your leg higher than your hip. Do not lift your arm higher than your shoulder. Keep your abdominal and back muscles tight. Keep your hips facing the ground. Do not arch your back. Keep your balance carefully, and do not hold your breath. Hold this position for __________ seconds. Slowly return to the starting position. Repeat with your right leg and your left arm. Repeat __________ times. Complete this exercise __________ times a day. Abdominal set with straight leg raise  Lie on your back on a firm bed or the floor. Bend one of your knees and keep your other leg straight. Tense your abdominal muscles and lift your straight leg up, 4-6 inches (10-15 cm) off the ground. Keep your abdominal muscles tight and hold this position for __________ seconds. Do not hold your breath. Do not arch your back. Keep it flat against the ground. Keep your abdominal muscles tense as you slowly lower your leg back to the starting position. Repeat with your other leg. Repeat __________ times. Complete this exercise __________ times a  day. Single leg lower with bent knees Lie on your back on a firm bed or the floor. Tense your abdominal muscles and lift your feet off the floor, one foot at a time, so your knees and hips are bent in 90-degree angles (right angles). Your knees should be over your hips and your lower legs should be parallel to the floor. Keeping your abdominal muscles tense and your knee bent, slowly lower one of your legs so your toe touches the ground. Lift your leg back up to return to the starting position. Do not hold your breath. Do not let your back arch. Keep  your back flat against the ground. Repeat with your other leg. Repeat __________ times. Complete this exercise __________ times a day. Posture and body mechanics Good posture and healthy body mechanics can help to relieve stress in your body's tissues and joints. Body mechanics refers to the movements and positions of your body while you do your daily activities. Posture is part of body mechanics. Good posture means: Your spine is in its natural S-curve position (neutral). Your shoulders are pulled back slightly. Your head is not tipped forward (neutral). Follow these guidelines to improve your posture and body mechanics in your everyday activities. Standing  When standing, keep your spine neutral and your feet about hip-width apart. Keep a slight bend in your knees. Your ears, shoulders, and hips should line up. When you do a task in which you stand in one place for a long time, place one foot up on a stable object that is 2-4 inches (5-10 cm) high, such as a footstool. This helps keep your spine neutral. Sitting  When sitting, keep your spine neutral and keep your feet flat on the floor. Use a footrest, if necessary, and keep your thighs parallel to the floor. Avoid rounding your shoulders, and avoid tilting your head forward. When working at a desk or a computer, keep your desk at a height where your hands are slightly lower than your elbows.  Slide your chair under your desk so you are close enough to maintain good posture. When working at a computer, place your monitor at a height where you are looking straight ahead and you do not have to tilt your head forward or downward to look at the screen. Resting When lying down and resting, avoid positions that are most painful for you. If you have pain with activities such as sitting, bending, stooping, or squatting, lie in a position in which your body does not bend very much. For example, avoid curling up on your side with your arms and knees near your chest (fetal position). If you have pain with activities such as standing for a long time or reaching with your arms, lie with your spine in a neutral position and bend your knees slightly. Try the following positions: Lying on your side with a pillow between your knees. Lying on your back with a pillow under your knees. Lifting  When lifting objects, keep your feet at least shoulder-width apart and tighten your abdominal muscles. Bend your knees and hips and keep your spine neutral. It is important to lift using the strength of your legs, not your back. Do not lock your knees straight out. Always ask for help to lift heavy or awkward objects. This information is not intended to replace advice given to you by your health care provider. Make sure you discuss any questions you have with your health care provider. Document Revised: 01/03/2023 Document Reviewed: 11/17/2020 [employer] Patient Education  2024 [employer] Hand Exercises Hand exercises can be helpful for almost anyone. They can strengthen your hands and improve flexibility and movement. The exercises can also increase blood flow to the hands. These results can make your work and daily tasks easier for you. Hand exercises can be especially helpful for people who have joint pain from arthritis or nerve damage from using their hands over and over. These exercises can also help people  who injure a hand. Exercises Most of these hand exercises are gentle stretching and motion exercises. It is usually safe to do them often throughout the day. Warming  up your hands before exercise may help reduce stiffness. You can do this with gentle massage or by placing your hands in warm water for 10-15 minutes. It is normal to feel some stretching, pulling, tightness, or mild discomfort when you begin new exercises. In time, this will improve. Remember to always be careful and stop right away if you feel sudden, very bad pain or your pain gets worse. You want to get better and be safe. Ask your health care provider which exercises are safe for you. Do exercises exactly as told by your provider and adjust them as told. Do not begin these exercises until told by your provider. Knuckle bend or "claw" fist  Stand or sit with your arm, hand, and all five fingers pointed straight up. Make sure to keep your wrist straight. Gently bend your fingers down toward your palm until the tips of your fingers are touching your palm. Keep your big knuckle straight and only bend the small knuckles in your fingers. Hold this position for 10 seconds. Straighten your fingers back to your starting position. Repeat this exercise 5-10 times with each hand. Full finger fist  Stand or sit with your arm, hand, and all five fingers pointed straight up. Make sure to keep your wrist straight. Gently bend your fingers into your palm until the tips of your fingers are touching the middle of your palm. Hold this position for 10 seconds. Extend your fingers back to your starting position, stretching every joint fully. Repeat this exercise 5-10 times with each hand. Straight fist  Stand or sit with your arm, hand, and all five fingers pointed straight up. Make sure to keep your wrist straight. Gently bend your fingers at the big knuckle, where your fingers meet your hand, and at the middle knuckle. Keep the knuckle at the  tips of your fingers straight and try to touch the bottom of your palm. Hold this position for 10 seconds. Extend your fingers back to your starting position, stretching every joint fully. Repeat this exercise 5-10 times with each hand. Tabletop  Stand or sit with your arm, hand, and all five fingers pointed straight up. Make sure to keep your wrist straight. Gently bend your fingers at the big knuckle, where your fingers meet your hand, as far down as you can. Keep the small knuckles in your fingers straight. Think of forming a tabletop with your fingers. Hold this position for 10 seconds. Extend your fingers back to your starting position, stretching every joint fully. Repeat this exercise 5-10 times with each hand. Finger spread  Place your hand flat on a table with your palm facing down. Make sure your wrist stays straight. Spread your fingers and thumb apart from each other as far as you can until you feel a gentle stretch. Hold this position for 10 seconds. Bring your fingers and thumb tight together again. Hold this position for 10 seconds. Repeat this exercise 5-10 times with each hand. Making circles  Stand or sit with your arm, hand, and all five fingers pointed straight up. Make sure to keep your wrist straight. Make a circle by touching the tip of your thumb to the tip of your index finger. Hold for 10 seconds. Then open your hand wide. Repeat this motion with your thumb and each of your fingers. Repeat this exercise 5-10 times with each hand. Thumb motion  Sit with your forearm resting on a table and your wrist straight. Your thumb should be facing up toward the ceiling.  Keep your fingers relaxed as you move your thumb. Lift your thumb up as high as you can toward the ceiling. Hold for 10 seconds. Bend your thumb across your palm as far as you can, reaching the tip of your thumb for the small finger (pinkie) side of your palm. Hold for 10 seconds. Repeat this exercise 5-10  times with each hand. Grip strengthening  Hold a stress ball or other soft ball in the middle of your hand. Slowly increase the pressure, squeezing the ball as much as you can without causing pain. Think of bringing the tips of your fingers into the middle of your palm. All of your finger joints should bend when doing this exercise. Hold your squeeze for 10 seconds, then relax. Repeat this exercise 5-10 times with each hand. Contact a health care provider if: Your hand pain or discomfort gets much worse when you do an exercise. Your hand pain or discomfort does not improve within 2 hours after you exercise. If you have either of these problems, stop doing these exercises right away. Do not do them again unless your provider says that you can. Get help right away if: You develop sudden, severe hand pain or swelling. If this happens, stop doing these exercises right away. Do not do them again unless your provider says that you can. This information is not intended to replace advice given to you by your health care provider. Make sure you discuss any questions you have with your health care provider. Document Revised: 09/14/2022 Document Reviewed: 09/14/2022 [employer] Patient Education  2024 [employer].

## 2024-04-27 ENCOUNTER — Encounter

## 2024-05-02 ENCOUNTER — Encounter

## 2024-05-04 ENCOUNTER — Encounter

## 2024-05-09 ENCOUNTER — Ambulatory Visit
Admission: RE | Admit: 2024-05-09 | Discharge: 2024-05-09 | Disposition: A | Discharge status: Home / Self Care | Source: Ambulatory Visit | Attending: Internal Medicine | Admitting: Internal Medicine

## 2024-05-09 ENCOUNTER — Encounter

## 2024-05-09 DIAGNOSIS — Z1231 Encounter for screening mammogram for malignant neoplasm of breast: Secondary | ICD-10-CM | POA: Diagnosis present

## 2024-05-11 ENCOUNTER — Encounter

## 2024-05-16 ENCOUNTER — Encounter

## 2024-05-18 ENCOUNTER — Encounter

## 2024-05-23 ENCOUNTER — Encounter

## 2024-05-25 ENCOUNTER — Encounter
